# Patient Record
Sex: Male | Born: 1945
Health system: Southern US, Community
[De-identification: ages and names within clinical notes are randomized; demographics above are authoritative.]

## PROBLEM LIST (undated history)

## (undated) DIAGNOSIS — N400 Enlarged prostate without lower urinary tract symptoms: Secondary | ICD-10-CM

## (undated) DIAGNOSIS — R400 Somnolence: Secondary | ICD-10-CM

## (undated) DIAGNOSIS — J449 Chronic obstructive pulmonary disease, unspecified: Secondary | ICD-10-CM

## (undated) DIAGNOSIS — N529 Male erectile dysfunction, unspecified: Secondary | ICD-10-CM

## (undated) DIAGNOSIS — I10 Essential (primary) hypertension: Secondary | ICD-10-CM

## (undated) DIAGNOSIS — H8109 Meniere's disease, unspecified ear: Secondary | ICD-10-CM

## (undated) DIAGNOSIS — I712 Thoracic aortic aneurysm, without rupture, unspecified: Secondary | ICD-10-CM

## (undated) DIAGNOSIS — K219 Gastro-esophageal reflux disease without esophagitis: Secondary | ICD-10-CM

## (undated) DIAGNOSIS — E119 Type 2 diabetes mellitus without complications: Secondary | ICD-10-CM

## (undated) DIAGNOSIS — E041 Nontoxic single thyroid nodule: Secondary | ICD-10-CM

## (undated) DIAGNOSIS — E782 Mixed hyperlipidemia: Secondary | ICD-10-CM

## (undated) HISTORY — PX: CATARACT EXTRACTION: SUR2

## (undated) HISTORY — DX: Essential (primary) hypertension: I10

## (undated) HISTORY — DX: Type 2 diabetes mellitus without complications: E11.9

## (undated) HISTORY — DX: Thoracic aortic aneurysm, without rupture, unspecified: I71.20

## (undated) HISTORY — DX: Gastro-esophageal reflux disease without esophagitis: K21.9

## (undated) HISTORY — DX: Thoracic aortic aneurysm, without rupture: I71.2

## (undated) HISTORY — DX: Benign prostatic hyperplasia without lower urinary tract symptoms: N40.0

## (undated) HISTORY — DX: Somnolence: R40.0

## (undated) HISTORY — DX: Male erectile dysfunction, unspecified: N52.9

## (undated) HISTORY — PX: ELBOW SURGERY: SHX618

## (undated) HISTORY — DX: Chronic obstructive pulmonary disease, unspecified: J44.9

## (undated) HISTORY — DX: Nontoxic single thyroid nodule: E04.1

## (undated) HISTORY — DX: Meniere's disease, unspecified ear: H81.09

## (undated) HISTORY — DX: Mixed hyperlipidemia: E78.2

---

## 2006-07-02 ENCOUNTER — Ambulatory Visit: Payer: Self-pay | Admitting: Internal Medicine

## 2006-07-02 LAB — CONVERTED CEMR LAB
Albumin: 3.8 g/dL (ref 3.5–5.2)
Basophils Absolute: 0 10*3/uL (ref 0.0–0.1)
Bilirubin, Direct: 0.2 mg/dL (ref 0.0–0.3)
Cholesterol: 218 mg/dL (ref 0–200)
Direct LDL: 170.3 mg/dL
Eosinophils Absolute: 0.2 10*3/uL (ref 0.0–0.6)
Eosinophils Relative: 3.1 % (ref 0.0–5.0)
GFR calc non Af Amer: 91 mL/min
Glucose, Bld: 115 mg/dL — ABNORMAL HIGH (ref 70–99)
HCT: 46.6 % (ref 39.0–52.0)
Hemoglobin: 16.4 g/dL (ref 13.0–17.0)
Leukocytes, UA: NEGATIVE
Lymphocytes Relative: 24.3 % (ref 12.0–46.0)
MCHC: 35.2 g/dL (ref 30.0–36.0)
MCV: 92.3 fL (ref 78.0–100.0)
Monocytes Absolute: 0.4 10*3/uL (ref 0.2–0.7)
Neutrophils Relative %: 64.2 % (ref 43.0–77.0)
PSA: 5 ng/mL — ABNORMAL HIGH (ref 0.10–4.00)
Potassium: 4.2 meq/L (ref 3.5–5.1)
Sodium: 141 meq/L (ref 135–145)
Total Bilirubin: 0.8 mg/dL (ref 0.3–1.2)
Total CHOL/HDL Ratio: 7.4
Urobilinogen, UA: 0.2 (ref 0.0–1.0)

## 2006-07-07 ENCOUNTER — Ambulatory Visit: Payer: Self-pay | Admitting: Internal Medicine

## 2006-07-19 ENCOUNTER — Ambulatory Visit: Payer: Self-pay | Admitting: Internal Medicine

## 2006-07-26 ENCOUNTER — Ambulatory Visit: Payer: Self-pay | Admitting: Internal Medicine

## 2006-08-02 ENCOUNTER — Encounter (INDEPENDENT_AMBULATORY_CARE_PROVIDER_SITE_OTHER): Payer: Self-pay | Admitting: Specialist

## 2006-08-02 ENCOUNTER — Ambulatory Visit: Payer: Self-pay | Admitting: Internal Medicine

## 2007-05-30 ENCOUNTER — Encounter: Payer: Self-pay | Admitting: Internal Medicine

## 2007-05-30 DIAGNOSIS — I10 Essential (primary) hypertension: Secondary | ICD-10-CM | POA: Insufficient documentation

## 2010-10-03 NOTE — Assessment & Plan Note (Signed)
[facility]                           PRIMARY CARE OFFICE NOTE   STEELE, STRACENER                     MRN:          [id number]  DATE:07/07/2006                            DOB:          12-17-1945    Mr. Wayne [name] is a 65 year old Caucasian gentleman who presents to  establish for ongoing continuity of care. He reports he has not seen a  doctor or had a physical exam for at least 15 years. He considers  himself healthy, he feels good and has no active complaints.   PAST MEDICAL HISTORY:   SURGICAL:  Surgical repair of a traumatic fracture of his right elbow  and arm in 1999 requiring hip graft. Final result involved a significant  reduction in range of motion of his right arm with inability to flex  more than 80 degrees.   MEDICAL:  1. Usual childhood diseases.  2. History of headaches.  3. History of GERD.  4. Hypertension with high blood pressure readings at home over the      last several months.  5. DJD of the back.   CURRENT MEDICATIONS:  1. Two baby aspirins daily.  2. Prilosec 20 mg q. a.m.  3. Ibuprofen as needed.   FAMILY HISTORY:  Hypertension in parents and sister, heart disease in  parents, negative for colon cancer, or lung cancer, or prostate cancer,  negative for diabetes.   SOCIAL HISTORY:  Patient completed high school. He works as a Technical brewer person. He has been married 15 years, divorced, married 4  years, divorced, married 18 years to his present wife. He has 2  daughters, 1 son, 6 grandchildren. He reports his marriage is in good  health. Reports that his wife is in good health.   HABITS:  Tobacco one and one half packs per day with more than a 45 pack-  year smoking history, alcohol, averaging 3 ounces per day. Patient has  no known drug allergies.   REVIEW OF SYSTEMS:  Patient has early awakening, does have some mild  weight gain recently, he has not had an eye exam for more than 3 years,  no ENT,  cardiovascular, respiratory, GI, or GU complaints. He does have  some carpal spasm from time to time.   PHYSICAL EXAMINATION:  Temperature was 96.6, blood pressure 200/99,  pulse 77, weight 200.  GENERAL APPEARANCE: This is a heavyset Caucasian male, looks his stated  age in no acute distress.  HEENT EXAM: Normocephalic, atraumatic, EACs, and TM s were unremarkable,  oropharynx  revealed fair dentition, no buccal lesions were noted,  posterior pharynx was clear. Conjunctivae sclera were clear, pupils  equal, round, and reactive to light and accommodation, funduscopic exam  with hand held instrument revealed normal disc margins, question of  arterial narrowing, no exudates, hemorrhages, or other vascular  abnormalities.  NECK: Supple without thyromegaly.  NODES: No adenopathy was noted in the cervical supraclavicular,  axillary, inguinal regions.  CHEST: No CVA tenderness. Patient with increased AP diameter.  LUNGS: Clear with no rales, wheezes, or rhonchi.  CARDIOVASCULAR: 2+ radial pulse, no JVD, no carotid  bruits, he had a  quite precordium with a regular rate and rhythm without murmurs, rubs,  or gallops.  ABDOMEN: Protuberant, no organosplenomegaly was appreciated. He had no  guarding, or rebound, or tenderness.  GENITALIA: Normal male, bilaterally descended testicles without masses,  no evidence of inguinal hernia.  RECTAL EXAM: Normal sphincter tone was noted. Prostate was very  generous, firm but no nodules were appreciated, stool was heme positive.  EXTREMITIES: Without clubbing, cyanosis, edema, no deformities were  noted.   Laboratory, hemoglobin elevated at 16.4 grams consistent with smoking  history, white count was normal at 5,700 with a normal differential,  chemistries revealed electrolytes to be normal, glucose was 115, kidney  function normal with a creatinine of 0.9., liver functions were normal.  Cholesterol was elevated at 218, triglycerides 124, HDL was 29.6  and  low, LDL cholesterol was elevated at 170. 3, TSH was normal at 1.64, PSA  was elevated at 5.0.   A 12-lead electrocardiogram revealed increased QRS voltage, suggestive  of mild LVH. No other abnormalities are noted except for a bradycardic  rhythm.   PA and lateral chest x-ray revealed the patient to have left ventricular  hypertrophy, lung fields were otherwise clear.   ASSESSMENT/PLAN:  1. Hypertension, patient with significant hypertension. He has LVH at      this point. Renal function is normal. Plan: patient started on      Benicar 20 mg q. day, 3 weeks of samples were provided. He will      need to follow up to monitor blood pressure and adjust medications      as indicated.  2. Tobacco abuse, patient is adamantly encouraged to consider smoking      cessation. Chest x-ray revealed no parenchymal abnormalities. This      was explained to him as increased risk for coronary disease as well      as lung disease.  3. Lipids, patient with markedly elevated LDL cholesterol. Discussed      this with the patient in terms of cardiac risk. Plan: lifestyle      management with the patient to follow a low fat diet and to      increase his exercise quotient. Repeat laboratory in 3 months.  4. Gastrointestinal, patient will be due for routine colorectal cancer      screening. On exam however, he did have heme positive stool,      although a trace only. Plan: patient is referred to      gastrointestinal for a diagnostic colonoscopy.  5. Genitourinary: patient has an elevated PSA at 5.0. His prostate was      unremarkable with no specific nodules. Plan: repeat PSA in 3 months      at same time we repeat his lipid panel. If patient has any      accelerations we need to consider genitourinary referal.   SUMMARY:  This is a very pleasant gentleman, he really seems to be  stable, physical exam except for the problems outlined above. He will need to return to see me for blood pressure check  in 3 weeks or so. He  will need to have follow up laboratory in 3 months.     Rosalyn Gess Norins, MD  Electronically Signed    MEN/MedQ  DD: 07/08/2006  DT: 07/08/2006  Job #: [id number]   cc:   Dani Gobble

## 2011-07-14 ENCOUNTER — Encounter: Payer: Self-pay | Admitting: Internal Medicine

## 2012-03-11 ENCOUNTER — Encounter: Payer: Self-pay | Admitting: Internal Medicine

## 2015-05-22 DIAGNOSIS — M9901 Segmental and somatic dysfunction of cervical region: Secondary | ICD-10-CM | POA: Diagnosis not present

## 2015-05-22 DIAGNOSIS — M9902 Segmental and somatic dysfunction of thoracic region: Secondary | ICD-10-CM | POA: Diagnosis not present

## 2015-05-22 DIAGNOSIS — M5013 Cervical disc disorder with radiculopathy, cervicothoracic region: Secondary | ICD-10-CM | POA: Diagnosis not present

## 2015-05-22 DIAGNOSIS — M62830 Muscle spasm of back: Secondary | ICD-10-CM | POA: Diagnosis not present

## 2015-05-22 DIAGNOSIS — M50323 Other cervical disc degeneration at C6-C7 level: Secondary | ICD-10-CM | POA: Diagnosis not present

## 2015-06-14 DIAGNOSIS — I10 Essential (primary) hypertension: Secondary | ICD-10-CM | POA: Diagnosis not present

## 2015-06-14 DIAGNOSIS — J018 Other acute sinusitis: Secondary | ICD-10-CM | POA: Diagnosis not present

## 2015-06-14 DIAGNOSIS — E119 Type 2 diabetes mellitus without complications: Secondary | ICD-10-CM | POA: Diagnosis not present

## 2015-06-14 DIAGNOSIS — R42 Dizziness and giddiness: Secondary | ICD-10-CM | POA: Diagnosis not present

## 2015-07-15 DIAGNOSIS — R42 Dizziness and giddiness: Secondary | ICD-10-CM | POA: Diagnosis not present

## 2015-07-15 DIAGNOSIS — I10 Essential (primary) hypertension: Secondary | ICD-10-CM | POA: Diagnosis not present

## 2015-07-15 DIAGNOSIS — E1142 Type 2 diabetes mellitus with diabetic polyneuropathy: Secondary | ICD-10-CM | POA: Diagnosis not present

## 2015-07-15 DIAGNOSIS — R51 Headache: Secondary | ICD-10-CM | POA: Diagnosis not present

## 2015-08-13 DIAGNOSIS — R42 Dizziness and giddiness: Secondary | ICD-10-CM | POA: Diagnosis not present

## 2015-08-13 DIAGNOSIS — R202 Paresthesia of skin: Secondary | ICD-10-CM | POA: Diagnosis not present

## 2015-08-13 DIAGNOSIS — R51 Headache: Secondary | ICD-10-CM | POA: Diagnosis not present

## 2015-08-13 DIAGNOSIS — F5102 Adjustment insomnia: Secondary | ICD-10-CM | POA: Diagnosis not present

## 2015-08-20 DIAGNOSIS — D519 Vitamin B12 deficiency anemia, unspecified: Secondary | ICD-10-CM | POA: Diagnosis not present

## 2015-08-27 DIAGNOSIS — D519 Vitamin B12 deficiency anemia, unspecified: Secondary | ICD-10-CM | POA: Diagnosis not present

## 2015-09-04 DIAGNOSIS — D519 Vitamin B12 deficiency anemia, unspecified: Secondary | ICD-10-CM | POA: Diagnosis not present

## 2015-09-16 DIAGNOSIS — E1142 Type 2 diabetes mellitus with diabetic polyneuropathy: Secondary | ICD-10-CM | POA: Diagnosis not present

## 2015-09-16 DIAGNOSIS — F5102 Adjustment insomnia: Secondary | ICD-10-CM | POA: Diagnosis not present

## 2015-09-16 DIAGNOSIS — D519 Vitamin B12 deficiency anemia, unspecified: Secondary | ICD-10-CM | POA: Diagnosis not present

## 2015-09-16 DIAGNOSIS — N400 Enlarged prostate without lower urinary tract symptoms: Secondary | ICD-10-CM | POA: Diagnosis not present

## 2015-10-16 DIAGNOSIS — E1142 Type 2 diabetes mellitus with diabetic polyneuropathy: Secondary | ICD-10-CM | POA: Diagnosis not present

## 2015-10-16 DIAGNOSIS — I10 Essential (primary) hypertension: Secondary | ICD-10-CM | POA: Diagnosis not present

## 2015-10-16 DIAGNOSIS — D519 Vitamin B12 deficiency anemia, unspecified: Secondary | ICD-10-CM | POA: Diagnosis not present

## 2015-10-16 DIAGNOSIS — E1122 Type 2 diabetes mellitus with diabetic chronic kidney disease: Secondary | ICD-10-CM | POA: Diagnosis not present

## 2015-10-16 DIAGNOSIS — Z79899 Other long term (current) drug therapy: Secondary | ICD-10-CM | POA: Diagnosis not present

## 2015-10-16 DIAGNOSIS — E119 Type 2 diabetes mellitus without complications: Secondary | ICD-10-CM | POA: Diagnosis not present

## 2015-10-17 DIAGNOSIS — E1142 Type 2 diabetes mellitus with diabetic polyneuropathy: Secondary | ICD-10-CM | POA: Diagnosis not present

## 2015-10-17 DIAGNOSIS — R51 Headache: Secondary | ICD-10-CM | POA: Diagnosis not present

## 2015-10-17 DIAGNOSIS — E782 Mixed hyperlipidemia: Secondary | ICD-10-CM | POA: Diagnosis not present

## 2015-10-17 DIAGNOSIS — D519 Vitamin B12 deficiency anemia, unspecified: Secondary | ICD-10-CM | POA: Diagnosis not present

## 2015-10-17 DIAGNOSIS — I10 Essential (primary) hypertension: Secondary | ICD-10-CM | POA: Diagnosis not present

## 2015-10-28 DIAGNOSIS — I6782 Cerebral ischemia: Secondary | ICD-10-CM | POA: Diagnosis not present

## 2015-11-13 DIAGNOSIS — I10 Essential (primary) hypertension: Secondary | ICD-10-CM | POA: Diagnosis not present

## 2015-11-13 DIAGNOSIS — D518 Other vitamin B12 deficiency anemias: Secondary | ICD-10-CM | POA: Diagnosis not present

## 2015-11-13 DIAGNOSIS — R0602 Shortness of breath: Secondary | ICD-10-CM | POA: Diagnosis not present

## 2015-11-13 DIAGNOSIS — R05 Cough: Secondary | ICD-10-CM | POA: Diagnosis not present

## 2015-11-13 DIAGNOSIS — R51 Headache: Secondary | ICD-10-CM | POA: Diagnosis not present

## 2015-11-13 DIAGNOSIS — R42 Dizziness and giddiness: Secondary | ICD-10-CM | POA: Diagnosis not present

## 2015-11-25 DIAGNOSIS — N281 Cyst of kidney, acquired: Secondary | ICD-10-CM | POA: Diagnosis not present

## 2015-11-25 DIAGNOSIS — K5792 Diverticulitis of intestine, part unspecified, without perforation or abscess without bleeding: Secondary | ICD-10-CM | POA: Diagnosis not present

## 2015-12-05 DIAGNOSIS — R42 Dizziness and giddiness: Secondary | ICD-10-CM | POA: Diagnosis not present

## 2015-12-05 DIAGNOSIS — K5732 Diverticulitis of large intestine without perforation or abscess without bleeding: Secondary | ICD-10-CM | POA: Diagnosis not present

## 2015-12-05 DIAGNOSIS — J410 Simple chronic bronchitis: Secondary | ICD-10-CM | POA: Diagnosis not present

## 2016-01-23 DIAGNOSIS — I10 Essential (primary) hypertension: Secondary | ICD-10-CM | POA: Diagnosis not present

## 2016-01-23 DIAGNOSIS — Z23 Encounter for immunization: Secondary | ICD-10-CM | POA: Diagnosis not present

## 2016-01-23 DIAGNOSIS — E1142 Type 2 diabetes mellitus with diabetic polyneuropathy: Secondary | ICD-10-CM | POA: Diagnosis not present

## 2016-01-23 DIAGNOSIS — E782 Mixed hyperlipidemia: Secondary | ICD-10-CM | POA: Diagnosis not present

## 2016-01-23 DIAGNOSIS — D518 Other vitamin B12 deficiency anemias: Secondary | ICD-10-CM | POA: Diagnosis not present

## 2016-04-24 DIAGNOSIS — E1142 Type 2 diabetes mellitus with diabetic polyneuropathy: Secondary | ICD-10-CM | POA: Diagnosis not present

## 2016-04-24 DIAGNOSIS — E782 Mixed hyperlipidemia: Secondary | ICD-10-CM | POA: Diagnosis not present

## 2016-04-24 DIAGNOSIS — H6502 Acute serous otitis media, left ear: Secondary | ICD-10-CM | POA: Diagnosis not present

## 2016-04-24 DIAGNOSIS — I10 Essential (primary) hypertension: Secondary | ICD-10-CM | POA: Diagnosis not present

## 2016-04-24 DIAGNOSIS — D518 Other vitamin B12 deficiency anemias: Secondary | ICD-10-CM | POA: Diagnosis not present

## 2016-10-20 ENCOUNTER — Encounter (INDEPENDENT_AMBULATORY_CARE_PROVIDER_SITE_OTHER): Payer: Self-pay

## 2016-10-20 ENCOUNTER — Encounter: Payer: Self-pay | Admitting: Neurology

## 2016-10-20 ENCOUNTER — Ambulatory Visit (INDEPENDENT_AMBULATORY_CARE_PROVIDER_SITE_OTHER): Payer: Medicare Other | Admitting: Neurology

## 2016-10-20 DIAGNOSIS — R413 Other amnesia: Secondary | ICD-10-CM

## 2016-10-20 DIAGNOSIS — G3184 Mild cognitive impairment, so stated: Secondary | ICD-10-CM | POA: Insufficient documentation

## 2016-10-20 DIAGNOSIS — R42 Dizziness and giddiness: Secondary | ICD-10-CM | POA: Insufficient documentation

## 2016-10-20 DIAGNOSIS — G4489 Other headache syndrome: Secondary | ICD-10-CM | POA: Insufficient documentation

## 2016-10-20 DIAGNOSIS — R9089 Other abnormal findings on diagnostic imaging of central nervous system: Secondary | ICD-10-CM | POA: Diagnosis not present

## 2016-10-20 MED ORDER — NORTRIPTYLINE HCL 10 MG PO CAPS: ORAL_CAPSULE | ORAL | 3 refills | Status: DC

## 2016-10-20 NOTE — Progress Notes (Signed)
[facility]  PATIENT: Wayne [name] DOB: [birth year]  REFERRING DOCTOR OR PCP:  Rochel Brome SOURCE: patient, notes from Dr. Tobie Poet, MRI reports, MRI images on CD/PACS  _________________________________   HISTORICAL  CHIEF COMPLAINT:  Chief Complaint  Patient presents with  . Headache    Wayne [name] is here with his wife Caren Griffins for eval of intermittent h/a's, vertigo over the last 5 yrs.  MRI brain done at [facility] and pt. dx. with "early dementia."  Pt. sts. he has never c/o memory loss, would like a 2nd opinion regarding dx/fim  . Dizziness    HISTORY OF PRESENT ILLNESS:  I had the pleasure seeing you patient, Wayne [name], at [facility] for neurologic consultation regarding his headaches and abnormal MRI.  He is a 71 yo man who has had headaches for the past 3 years.   He has had pain that radiates form his upper neck/occiput to the right side of his head.   Pain is usually mild but is present daily with fluctuations.   He denies nausea, photophpobia or phonophobia.    Pain does not worsen or improve or worsen with movements.    BC powders help for a couple hours.   He saw a chiropractor and felt a little better for a while but had no sustained benefit.      He also has had vertigo and feels off balanced for the past 3-4 year.    Thi is worse if he looks up or when he bends over.   He describes a translational more than rotary dizziness.    Vertigo is not present when he is stiill but will occur as he stands up.   He has reduced left sided hearing x 3-4 years as well.     He denies any problems with his memory or with other cognitive skills.    However, his wife feels that he has had mild forgetfulness and he had one episode of getting lost while driving. She does not think that there has been rapid change.  He smokes 1 1/2 ppd tobacco.    He also has HTN and NIDDM.  He is on bASA  I personally reviewed the MRI of the brain performed a  [facility] on the 1.2 Tesla magnet 10/28/2015. It shows severe atrophy that is generalized but a little worse in the mesial temporal lobes and the parietal lobes. There is moderate chronic buttock vessel ischemic change. There were no acute findings. Additionally there was mild ethmoid sinusitis.  REVIEW OF SYSTEMS: Constitutional: No fevers, chills, sweats, or change in appetite Eyes: No visual changes, double vision, eye pain Ear, nose and throat: He notes vertical and also has hearing is reduced on the left side. Cardiovascular: No chest pain, palpitations Respiratory: No shortness of breath at rest or with exertion.   He wheezes and was dx with COPD GastrointestinaI: No nausea, vomiting, diarrhea, abdominal pain, fecal incontinence Genitourinary: No dysuria, urinary retention or frequency.  No nocturia. Musculoskeletal: No neck pain, back pain Integumentary: No rash, pruritus, skin lesions Neurological: as above Psychiatric: No depression at this time.  No anxiety Endocrine: No palpitations, diaphoresis, change in appetite, change in weigh or increased thirst Hematologic/Lymphatic: No anemia, purpura, petechiae. Allergic/Immunologic: No itchy/runny eyes, nasal congestion, recent allergic reactions, rashes  ALLERGIES: No Known Allergies  HOME MEDICATIONS:  Current Outpatient Prescriptions:  .  amLODipine (NORVASC) 5 MG tablet, , Disp: , Rfl:  .  aspirin 81 MG chewable tablet, Chew 81  mg by mouth daily., Disp: , Rfl:  .  Aspirin-Salicylamide-Caffeine (BC HEADACHE POWDER PO), Take by mouth., Disp: , Rfl:  .  finasteride (PROSCAR) 5 MG tablet, Take 5 mg by mouth daily., Disp: , Rfl:  .  LORazepam (ATIVAN) 1 MG tablet, , Disp: , Rfl:  .  losartan (COZAAR) 100 MG tablet, , Disp: , Rfl:  .  metFORMIN (GLUCOPHAGE) 1000 MG tablet, , Disp: , Rfl:  .  Naproxen Sod-Diphenhydramine (ALEVE PM PO), Take by mouth., Disp: , Rfl:  .  omeprazole (PRILOSEC) 40 MG capsule, , Disp: , Rfl:  .   nortriptyline (PAMELOR) 10 MG capsule, One or two at bedtime, Disp: 60 capsule, Rfl: 3  PAST MEDICAL HISTORY: Past Medical History:  Diagnosis Date  . COPD (chronic obstructive pulmonary disease) (HCC)   . Diabetes mellitus without complication (HCC)   . Hypertension     PAST SURGICAL HISTORY: Past Surgical History:  Procedure Laterality Date  . CATARACT EXTRACTION Bilateral   . ELBOW SURGERY Right     FAMILY HISTORY: Family History  Problem Relation Age of Onset  . Heart attack Mother   . Diabetes Father   . COPD Father     SOCIAL HISTORY:  Social History   Social History  . Marital status: Married    Spouse name: N/A  . Number of children: N/A  . Years of education: N/A   Occupational History  . Not on file.   Social History Main Topics  . Smoking status: Current Every Day Smoker  . Smokeless tobacco: Never Used  . Alcohol use Yes     Comment: occasional  . Drug use: No  . Sexual activity: Not on file   Other Topics Concern  . Not on file   Social History Narrative  . No narrative on file     PHYSICAL EXAM  Vitals:   10/20/16 1425  BP: (!) 175/92  Pulse: (!) 58  Resp: (!) 24  Weight: 195 lb 8 oz (88.7 kg)  Height: 5\' 7"  (1.702 m)    Body mass index is 30.62 kg/m.   General: The patient is well-developed and well-nourished and in no acute distress  HEENT:  [place]/AT; Tympanic membranes intact.  Funduscopic exam shows normal optic discs and retinal vessels.  Pharyn is non-erythematous.     Neck: The neck is supple, no carotid bruits are noted.  The neck is nontender.  Cardiovascular: The heart has a regular rate and rhythm with a normal S1 and S2. There were no murmurs, gallops or rubs. Lungs show mild wheezing (has COPD)  Skin: Extremities are without significant edema.  Musculoskeletal:  Back is nontender  Neurologic Exam  Mental status: The patient is alert and oriented x 3 at the time of the examination. The patient has apparent  normal recent  (3/3) and remote memory, with an apparently reduced attention span and concentration ability (100-94; WORLD-DLORW; 20-17-14-11-8).  He numbers a clock face correctly but had difficulty putting the hands in the right place. He made one mistake continuing a geometric pattern. He drew a box well.    Speech is normal.  Cranial nerves: Extraocular movements are full. Pupils are equal, round, and reactive to light and accomodation.  Visual fields are full.  Facial symmetry is present. There is good facial sensation to soft touch bilaterally.Facial strength is normal.  Trapezius and sternocleidomastoid strength is normal. No dysarthria is noted.  The tongue is midline, and the patient has symmetric elevation of the soft palate.  No obvious hearing deficits are noted.   Weber was midline  Motor:  Muscle bulk is normal.   Tone is normal. Strength is  5 / 5 in all 4 extremities.   Sensory: Sensory testing is intact to pinprick, soft touch and vibration sensation in all 4 extremities.  Coordination: Cerebellar testing reveals good finger-nose-finger and heel-to-shin bilaterally.  Gait and station: Station is normal.   Gait is normal. Tandem gait is wide Romberg is negative.   Reflexes: Deep tendon reflexes are symmetric and 1+  Bilateral in arms knees and absnet in ankles.   Plantar responses are flexor.  Dix-Hallpike maneuvers did not cause any vertigo or nystagmus.    DIAGNOSTIC DATA (LABS, IMAGING, TESTING) - I reviewed patient records, labs, notes, testing and imaging myself where available.  Lab Results  Component Value Date   WBC 5.7 07/02/2006   HGB 16.4 07/02/2006   HCT 46.6 07/02/2006   MCV 92.3 07/02/2006   PLT 268 07/02/2006      Component Value Date/Time   NA 141 07/02/2006 0807   K 4.2 07/02/2006 0807   CL 104 07/02/2006 0807   CO2 30 07/02/2006 0807   GLUCOSE 115 (H) 07/02/2006 0807   BUN 7 07/02/2006 0807   CREATININE 0.9 07/02/2006 0807   CALCIUM 8.9  07/02/2006 0807   PROT 6.7 07/02/2006 0807   ALBUMIN 3.8 07/02/2006 0807   AST 26 07/02/2006 0807   ALT 36 07/02/2006 0807   ALKPHOS 70 07/02/2006 0807   BILITOT 0.8 07/02/2006 0807   GFRNONAA 91 07/02/2006 0807   GFRAA 110 07/02/2006 0807   Lab Results  Component Value Date   CHOL 218 (HH) 07/02/2006   HDL 29.6 (L) 07/02/2006   LDLDIRECT 170.3 07/02/2006   TRIG 124 07/02/2006   CHOLHDL 7.4 CALC 07/02/2006   No results found for: HGBA1C No results found for: VITAMINB12 Lab Results  Component Value Date   TSH 1.64 07/02/2006       ASSESSMENT AND PLAN  Memory loss - Plan: Sedimentation rate, Vitamin B12, TSH  Other headache syndrome - Plan: Sedimentation rate  Vertigo  Abnormal brain MRI  Mild cognitive impairment   In summary, Wayne [name] is a 71 year old man who has had difficulty with headache and vertigo and was found on MRI to have atrophy and chronic microvascular ischemic change. The atrophy is more than expected for age.  We discussed that the combination of that and his performance on cognitive testing would be consistent with mild cognitive impairment though he did not perform poorly enough to be considered dementia at this time. We discussed that many people can remain in mild cognitive impairment without significant progression but about 20% per year we'll progress to Alzheimer's the fact that he did better with memory than with some other cognitive tests actually indicates a mildly reduced likelihood of progression to Alzheimer's disease.   I also discussed that the chronic microvascular ischemic changes are worse than expected for age. The combination of smoking, hypertension and diabetes could be contributing.   We will check ESR, TSH and B12 to make sure that easily treatable causes of memory disturbance are evaluated.    I will add low-dose nortriptyline to try to help his headaches. The etiology of his dizzines is unclear and his examination did not show an  easily provoked vertigo.  I will see him back in 6 months and we will reevaluate his cognition at that time however, he is advised to call us back sooner if  he has new or worsening neurologic symptoms.  Thank you for asking me to see Wayne [name]. Please let me know if I can be of further assistance with MRI of the patient's the future.   Palestine Mosco A. Felecia Shelling, MD, PhD 10/16/5181, 4:37 PM Certified in Neurology, Clinical Neurophysiology, Sleep Medicine, Pain Medicine and Neuroimaging  [facility] [address] [place], [place] [address] [phone]

## 2016-10-21 LAB — VITAMIN B12: Vitamin B-12: 297 pg/mL (ref 232–1245)

## 2016-10-21 LAB — SEDIMENTATION RATE: SED RATE: 5 mm/h (ref 0–30)

## 2016-10-21 LAB — TSH: TSH: 2.54 u[IU]/mL (ref 0.450–4.500)

## 2016-10-22 ENCOUNTER — Telehealth: Payer: Self-pay | Admitting: *Deleted

## 2016-10-22 NOTE — Telephone Encounter (Signed)
I have spoken with Mr. Wayne [name] this afternoon and per RAS, explained that lab work done in our office is fine.  He verbalized understanding of same/fim

## 2016-10-22 NOTE — Telephone Encounter (Signed)
-----   Message from Britt Bottom, MD sent at 10/21/2016  3:52 PM EDT ----- Please let the patient know that the lab work is fine.

## 2016-11-03 DIAGNOSIS — R079 Chest pain, unspecified: Secondary | ICD-10-CM | POA: Diagnosis not present

## 2016-11-03 DIAGNOSIS — R42 Dizziness and giddiness: Secondary | ICD-10-CM | POA: Diagnosis not present

## 2016-11-03 DIAGNOSIS — E1142 Type 2 diabetes mellitus with diabetic polyneuropathy: Secondary | ICD-10-CM | POA: Diagnosis not present

## 2016-11-03 DIAGNOSIS — F5102 Adjustment insomnia: Secondary | ICD-10-CM | POA: Diagnosis not present

## 2016-11-03 DIAGNOSIS — R911 Solitary pulmonary nodule: Secondary | ICD-10-CM | POA: Diagnosis not present

## 2016-11-03 DIAGNOSIS — I10 Essential (primary) hypertension: Secondary | ICD-10-CM | POA: Diagnosis not present

## 2016-11-03 DIAGNOSIS — E782 Mixed hyperlipidemia: Secondary | ICD-10-CM | POA: Diagnosis not present

## 2016-11-03 DIAGNOSIS — N400 Enlarged prostate without lower urinary tract symptoms: Secondary | ICD-10-CM | POA: Diagnosis not present

## 2017-02-05 DIAGNOSIS — E1142 Type 2 diabetes mellitus with diabetic polyneuropathy: Secondary | ICD-10-CM | POA: Diagnosis not present

## 2017-02-05 DIAGNOSIS — E782 Mixed hyperlipidemia: Secondary | ICD-10-CM | POA: Diagnosis not present

## 2017-02-05 DIAGNOSIS — Z23 Encounter for immunization: Secondary | ICD-10-CM | POA: Diagnosis not present

## 2017-02-05 DIAGNOSIS — R42 Dizziness and giddiness: Secondary | ICD-10-CM | POA: Diagnosis not present

## 2017-02-05 DIAGNOSIS — N400 Enlarged prostate without lower urinary tract symptoms: Secondary | ICD-10-CM | POA: Diagnosis not present

## 2017-02-05 DIAGNOSIS — F5102 Adjustment insomnia: Secondary | ICD-10-CM | POA: Diagnosis not present

## 2017-02-05 DIAGNOSIS — I10 Essential (primary) hypertension: Secondary | ICD-10-CM | POA: Diagnosis not present

## 2017-04-21 ENCOUNTER — Ambulatory Visit: Payer: Medicare Other | Admitting: Neurology

## 2017-05-13 ENCOUNTER — Encounter (HOSPITAL_COMMUNITY): Payer: Self-pay

## 2017-05-13 ENCOUNTER — Emergency Department (HOSPITAL_COMMUNITY): Payer: Medicare Other

## 2017-05-13 ENCOUNTER — Emergency Department (HOSPITAL_COMMUNITY)
Admission: EM | Admit: 2017-05-13 | Discharge: 2017-05-13 | Disposition: A | Discharge status: Home / Self Care | Payer: Medicare Other | Attending: Emergency Medicine | Admitting: Emergency Medicine

## 2017-05-13 DIAGNOSIS — Z79899 Other long term (current) drug therapy: Secondary | ICD-10-CM | POA: Insufficient documentation

## 2017-05-13 DIAGNOSIS — R42 Dizziness and giddiness: Secondary | ICD-10-CM | POA: Diagnosis not present

## 2017-05-13 DIAGNOSIS — E119 Type 2 diabetes mellitus without complications: Secondary | ICD-10-CM | POA: Insufficient documentation

## 2017-05-13 DIAGNOSIS — Z7982 Long term (current) use of aspirin: Secondary | ICD-10-CM | POA: Insufficient documentation

## 2017-05-13 DIAGNOSIS — J449 Chronic obstructive pulmonary disease, unspecified: Secondary | ICD-10-CM | POA: Insufficient documentation

## 2017-05-13 DIAGNOSIS — Z7984 Long term (current) use of oral hypoglycemic drugs: Secondary | ICD-10-CM | POA: Diagnosis not present

## 2017-05-13 DIAGNOSIS — I10 Essential (primary) hypertension: Secondary | ICD-10-CM | POA: Diagnosis not present

## 2017-05-13 DIAGNOSIS — M549 Dorsalgia, unspecified: Secondary | ICD-10-CM | POA: Diagnosis not present

## 2017-05-13 DIAGNOSIS — M545 Low back pain: Secondary | ICD-10-CM | POA: Insufficient documentation

## 2017-05-13 DIAGNOSIS — R200 Anesthesia of skin: Secondary | ICD-10-CM | POA: Diagnosis present

## 2017-05-13 DIAGNOSIS — M5126 Other intervertebral disc displacement, lumbar region: Secondary | ICD-10-CM | POA: Diagnosis not present

## 2017-05-13 DIAGNOSIS — F17210 Nicotine dependence, cigarettes, uncomplicated: Secondary | ICD-10-CM | POA: Insufficient documentation

## 2017-05-13 DIAGNOSIS — G8929 Other chronic pain: Secondary | ICD-10-CM | POA: Diagnosis not present

## 2017-05-13 DIAGNOSIS — K458 Other specified abdominal hernia without obstruction or gangrene: Secondary | ICD-10-CM | POA: Diagnosis not present

## 2017-05-13 LAB — BASIC METABOLIC PANEL
ANION GAP: 11 (ref 5–15)
BUN: 12 mg/dL (ref 6–20)
CALCIUM: 9.3 mg/dL (ref 8.9–10.3)
CHLORIDE: 98 mmol/L — AB (ref 101–111)
CO2: 28 mmol/L (ref 22–32)
CREATININE: 0.8 mg/dL (ref 0.61–1.24)
GFR calc Af Amer: >60 mL/min (ref >60)
GFR calc non Af Amer: >60 mL/min (ref >60)
Glucose, Bld: 145 mg/dL — ABNORMAL HIGH (ref 65–99)
Potassium: 3.5 mmol/L (ref 3.5–5.1)
Sodium: 137 mmol/L (ref 135–145)

## 2017-05-13 LAB — CBC WITH DIFFERENTIAL/PLATELET
Basophils Absolute: 0 10*3/uL (ref 0.0–0.1)
Basophils Relative: 0 %
Eosinophils Absolute: 0.1 10*3/uL (ref 0.0–0.7)
Eosinophils Relative: 2 %
HEMATOCRIT: 48.6 % (ref 39.0–52.0)
Hemoglobin: 17.1 g/dL — ABNORMAL HIGH (ref 13.0–17.0)
LYMPHS PCT: 26 %
Lymphs Abs: 1.9 10*3/uL (ref 0.7–4.0)
MCH: 32.6 pg (ref 26.0–34.0)
MCHC: 35.2 g/dL (ref 30.0–36.0)
MCV: 92.6 fL (ref 78.0–100.0)
MONO ABS: 0.5 10*3/uL (ref 0.1–1.0)
MONOS PCT: 7 %
NEUTROS ABS: 4.8 10*3/uL (ref 1.7–7.7)
Neutrophils Relative %: 65 %
Platelets: 219 10*3/uL (ref 150–400)
RBC: 5.25 MIL/uL (ref 4.22–5.81)
RDW: 13.2 % (ref 11.5–15.5)
WBC: 7.3 10*3/uL (ref 4.0–10.5)

## 2017-05-13 MED ORDER — LORAZEPAM 2 MG/ML IJ SOLN
1.0000 mg | Freq: Once | INTRAMUSCULAR | Status: AC
  Administered 2017-05-13: 1 mg via INTRAVENOUS
  Filled 2017-05-13: qty 1

## 2017-05-13 MED ORDER — ACETAMINOPHEN 500 MG PO TABS: 500.0000 mg | ORAL_TABLET | Freq: Four times a day (QID) | ORAL | 0 refills | Status: DC | PRN

## 2017-05-13 NOTE — ED Notes (Signed)
Pt ambulated to room from waiting room, tolerated well. 

## 2017-05-13 NOTE — ED Provider Notes (Signed)
[facility] EMERGENCY DEPARTMENT Provider Note   CSN: [id number] Arrival date & time: 05/13/17  1423     History   Chief Complaint No chief complaint on file.   HPI Wayne [name] is a 71 y.o. male.  Wayne [name] is a 71 y.o. Male who presents to the emergency department complaining of right leg numbness and foot drop ongoing for the past 4 days.  Patient reports his symptoms are worse about 4 days ago when he felt like he was dragging his toes across the floor on his right side.  He reports this is somewhat improved today.  He still reports feeling numbness and tingling across the dorsum of his right foot.  He also reports numbness and tingling in his right buttocks and into his right low back.  He denies any injury or trauma to his back.  He reports he also has some chronic feelings of lightheadedness/dizziness ongoing for over a year.  He had a recent MRI of his brain that he reports showed only early Alzheimer's disease.  He reports he has trouble sensing where his foot is other weakness, numbness or tingling.  He denies any right arm weakness or tingling.  No facial droop.  He denies fevers, falls, injury to his back, loss of bladder control, loss of bowel control, headache, syncope, room spinning dizziness, chest pain, coughing, shortness of breath, abdominal pain, nausea, vomiting or diarrhea.   The history is provided by the patient and medical records. No language interpreter was used.    Past Medical History:  Diagnosis Date  . COPD (chronic obstructive pulmonary disease) ([place])   . Diabetes mellitus without complication ([place])   . Hypertension     Patient Active Problem List   Diagnosis Date Noted  . Memory loss 10/20/2016  . Other headache syndrome 10/20/2016  . Vertigo 10/20/2016  . Abnormal brain MRI 10/20/2016  . Mild cognitive impairment 10/20/2016  . HYPERTENSION 05/30/2007    Past Surgical History:  Procedure Laterality Date  .  CATARACT EXTRACTION Bilateral   . ELBOW SURGERY Right        Home Medications    Prior to Admission medications   Medication Sig Start Date End Date Taking? Authorizing Provider  amLODipine (NORVASC) 5 MG tablet Take 5 mg by mouth daily.  07/30/16  Yes [provider]  aspirin 81 MG chewable tablet Chew 81 mg by mouth daily.   Yes [provider]  Aspirin-Salicylamide-Caffeine (BC HEADACHE POWDER PO) Take 1 packet by mouth every 8 (eight) hours as needed (for headaches).    Yes [provider]  finasteride (PROSCAR) 5 MG tablet Take 5 mg by mouth at bedtime.    Yes [provider]  fluticasone (FLONASE) 50 MCG/ACT nasal spray Place 2 sprays into both nostrils daily as needed for allergies or rhinitis.   Yes [provider]  LORazepam (ATIVAN) 1 MG tablet Take 1 mg by mouth daily as needed for anxiety or sleep.  10/14/16  Yes [provider]  losartan (COZAAR) 100 MG tablet Take 100 mg by mouth daily.  07/30/16  Yes [provider]  metFORMIN (GLUCOPHAGE) 1000 MG tablet Take 1,000 mg by mouth at bedtime.  07/30/16  Yes [provider]  Naproxen Sod-Diphenhydramine (ALEVE PM PO) Take 1 tablet by mouth at bedtime as needed (for pain and/or sleep).    Yes [provider]  omeprazole (PRILOSEC) 40 MG capsule Take 40 mg by mouth daily.  07/30/16  Yes [provider]  tamsulosin (FLOMAX) 0.4 MG CAPS capsule Take 0.4 mg by mouth at bedtime.   Yes [provider]  vitamin B-12 (CYANOCOBALAMIN) 1000 MCG tablet Take 1,000 mcg by mouth daily.   Yes [provider]  acetaminophen (TYLENOL) 500 MG tablet Take 1 tablet (500 mg total) by mouth every 6 (six) hours as needed. 05/13/17   Waynetta Pean, PA-C  nortriptyline (PAMELOR) 10 MG capsule One or two at bedtime Patient not taking: Reported on 05/13/2017 10/20/16   Britt Bottom, MD    Family History Family History  Problem Relation Age of Onset    . Heart attack Mother   . Diabetes Father   . COPD Father     Social History Social History   Tobacco Use  . Smoking status: Current Every Day Smoker  . Smokeless tobacco: Never Used  Substance Use Topics  . Alcohol use: Yes    Comment: occasional  . Drug use: No     Allergies   Pineapple   Review of Systems Review of Systems  Constitutional: Negative for chills and fever.  HENT: Negative for congestion and sore throat.   Eyes: Negative for visual disturbance.  Respiratory: Negative for cough and shortness of breath.   Cardiovascular: Negative for chest pain.  Gastrointestinal: Negative for abdominal pain, diarrhea, nausea and vomiting.  Genitourinary: Negative for difficulty urinating and dysuria.  Musculoskeletal: Positive for back pain and gait problem. Negative for neck pain.  Skin: Negative for rash.  Neurological: Positive for weakness, light-headedness and numbness. Negative for dizziness, syncope and headaches.     Physical Exam Updated Vital Signs BP (!) 161/96   Pulse 79   Temp 98 F (36.7 C) (Oral)   Resp 16   Ht 5\' 7"  (1.702 m)   Wt 87.5 kg (193 lb)   SpO2 96%   BMI 30.23 kg/m   Physical Exam  Constitutional: He is oriented to person, place, and time. He appears well-developed and well-nourished. No distress.  Nontoxic-appearing.  HENT:  Head: Normocephalic and atraumatic.  Mouth/Throat: Oropharynx is clear and moist.  Eyes: Conjunctivae are normal. Pupils are equal, round, and reactive to light. Right eye exhibits no discharge. Left eye exhibits no discharge.  Neck: Neck supple.  Cardiovascular: Normal rate, regular rhythm, normal heart sounds and intact distal pulses. Exam reveals no gallop and no friction rub.  No murmur heard. Bilateral radial, posterior tibialis and dorsalis pedis pulses are intact.    Pulmonary/Chest: Effort normal and breath sounds normal. No respiratory distress. He has no wheezes. He has no rales.  Abdominal: Soft.  There is no tenderness.  Musculoskeletal: He exhibits no edema or tenderness.  No lower extremity edema or tenderness.  Good strength to his bilateral lower extremities.  Good strength with plantar and dorsiflexion bilaterally.  No foot drop.  No midline neck or back tenderness.  No overlying skin changes to his back.  No calf edema or tenderness.  Lymphadenopathy:    He has no cervical adenopathy.  Neurological: He is alert and oriented to person, place, and time. A sensory deficit is present. No cranial nerve deficit. He exhibits normal muscle tone. Coordination normal.  Patient reports decreased sensation to the dorsum of his right foot.  He has normal gait.  No foot drop.  Good strength to his bilateral upper and lower extremities.  Good grip strength bilaterally.  No pronator drift.  Finger to nose intact.  Cranial nerves are intact.  Speech is clear and  coherent.  Unable to obtain patellar DTRs bilaterally.  Skin: Skin is warm and dry. Capillary refill takes less than 2 seconds. No rash noted. He is not diaphoretic. No erythema. No pallor.  Psychiatric: He has a normal mood and affect. His behavior is normal.  Nursing note and vitals reviewed.    ED Treatments / Results  Labs (all labs ordered are listed, but only abnormal results are displayed) Labs Reviewed  CBC WITH DIFFERENTIAL/PLATELET - Abnormal; Notable for the following components:      Result Value   Hemoglobin 17.1 (*)    All other components within normal limits  BASIC METABOLIC PANEL - Abnormal; Notable for the following components:   Chloride 98 (*)    Glucose, Bld 145 (*)    All other components within normal limits    EKG  EKG Interpretation None       Radiology Mr Thoracic Spine Wo Contrast  Result Date: 05/13/2017 CLINICAL DATA:  Back pain. Rapidly progressive new neurological deficit. EXAM: MRI THORACIC AND LUMBAR SPINE WITHOUT CONTRAST TECHNIQUE: Multiplanar and multiecho pulse sequences of the thoracic  and lumbar spine were obtained without intravenous contrast. COMPARISON:  None. FINDINGS: MRI THORACIC SPINE FINDINGS Alignment:  Physiologic. Vertebrae: No fracture, evidence of discitis, or bone lesion. Cord:  Normal signal and morphology. Paraspinal and other soft tissues: Dilatation of the descending thoracic aorta to diameter 3.8 cm. Otherwise negative. Disc levels: T1-2 through T12-L1: No disc bulging or protrusion. No spinal or foraminal stenosis. No significant facet joint disease. MRI LUMBAR SPINE FINDINGS Segmentation:  Standard. Alignment: 3 mm spondylolisthesis at L4-5. 4 mm retrolisthesis at L5-S1. Vertebrae:  No fracture, evidence of discitis, or bone lesion. Conus medullaris and cauda equina: Conus extends to the L1-2 level. Conus and cauda equina appear normal. Paraspinal and other soft tissues: 6.9 cm simple appearing cyst on the upper pole the left kidney. Multiple small cysts in the remainder of the left kidney. Multiple small cysts in the right kidney. Maximum transverse diameter of the abdominal aorta is 2.7 cm. Disc levels: L1-2:  Slight disc desiccation.  Otherwise normal. L2-3:  Normal. L3-4: Normal disc. Moderate bilateral facet arthritis. No neural impingement. L4-5: Moderately severe bilateral facet arthritis with ligamentum flavum hypertrophy. Grade 1 spondylolisthesis. Focal small soft disc protrusion in and lateral to the right neural foramen which could affect the right L4 nerve. Small bulge into the left neural foramen and across the midline. The combination of the disc bulging, spondylolisthesis, and hypertrophied facet joints and ligamentum flavum combine to create moderately severe spinal stenosis. This is best seen on image 24 of series 13. L5-S1: Marked disc space narrowing. Slight retrolisthesis of L5 on S1. No bulging or protrusion of the uncovered disc. No neural impingement. No foraminal stenosis. Mild bilateral facet arthritis. IMPRESSION: MR THORACIC SPINE IMPRESSION No  significant abnormality of the thoracic spine. MR LUMBAR SPINE IMPRESSION 1. Focal soft disc protrusion into the right neural foramen at L4-5 extending lateral to the neural foramen touching the right L4 nerve. 2. Moderately severe spinal stenosis at L4-5. Electronically Signed   By: Lorriane Shire M.D.   On: 05/13/2017 19:02   Mr Lumbar Spine Wo Contrast  Result Date: 05/13/2017 CLINICAL DATA:  Back pain. Rapidly progressive new neurological deficit. EXAM: MRI THORACIC AND LUMBAR SPINE WITHOUT CONTRAST TECHNIQUE: Multiplanar and multiecho pulse sequences of the thoracic and lumbar spine were obtained without intravenous contrast. COMPARISON:  None. FINDINGS: MRI THORACIC SPINE FINDINGS Alignment:  Physiologic. Vertebrae: No fracture, evidence of  discitis, or bone lesion. Cord:  Normal signal and morphology. Paraspinal and other soft tissues: Dilatation of the descending thoracic aorta to diameter 3.8 cm. Otherwise negative. Disc levels: T1-2 through T12-L1: No disc bulging or protrusion. No spinal or foraminal stenosis. No significant facet joint disease. MRI LUMBAR SPINE FINDINGS Segmentation:  Standard. Alignment: 3 mm spondylolisthesis at L4-5. 4 mm retrolisthesis at L5-S1. Vertebrae:  No fracture, evidence of discitis, or bone lesion. Conus medullaris and cauda equina: Conus extends to the L1-2 level. Conus and cauda equina appear normal. Paraspinal and other soft tissues: 6.9 cm simple appearing cyst on the upper pole the left kidney. Multiple small cysts in the remainder of the left kidney. Multiple small cysts in the right kidney. Maximum transverse diameter of the abdominal aorta is 2.7 cm. Disc levels: L1-2:  Slight disc desiccation.  Otherwise normal. L2-3:  Normal. L3-4: Normal disc. Moderate bilateral facet arthritis. No neural impingement. L4-5: Moderately severe bilateral facet arthritis with ligamentum flavum hypertrophy. Grade 1 spondylolisthesis. Focal small soft disc protrusion in and lateral  to the right neural foramen which could affect the right L4 nerve. Small bulge into the left neural foramen and across the midline. The combination of the disc bulging, spondylolisthesis, and hypertrophied facet joints and ligamentum flavum combine to create moderately severe spinal stenosis. This is best seen on image 24 of series 13. L5-S1: Marked disc space narrowing. Slight retrolisthesis of L5 on S1. No bulging or protrusion of the uncovered disc. No neural impingement. No foraminal stenosis. Mild bilateral facet arthritis. IMPRESSION: MR THORACIC SPINE IMPRESSION No significant abnormality of the thoracic spine. MR LUMBAR SPINE IMPRESSION 1. Focal soft disc protrusion into the right neural foramen at L4-5 extending lateral to the neural foramen touching the right L4 nerve. 2. Moderately severe spinal stenosis at L4-5. Electronically Signed   By: Lorriane Shire M.D.   On: 05/13/2017 19:02    Procedures Procedures (including critical care time)  Medications Ordered in ED Medications  LORazepam (ATIVAN) injection 1 mg (1 mg Intravenous Given 05/13/17 1737)     Initial Impression / Assessment and Plan / ED Course  I have reviewed the triage vital signs and the nursing notes.  Pertinent labs & imaging results that were available during my care of the patient were reviewed by me and considered in my medical decision making (see chart for details).     This  is a 71 y.o. Male who presents to the emergency department complaining of right leg numbness and foot drop ongoing for the past 4 days.  Patient reports his symptoms are worse about 4 days ago when he felt like he was dragging his toes across the floor on his right side.  He reports this is somewhat improved today.  He still reports feeling numbness and tingling across the dorsum of his right foot.  He also reports numbness and tingling in his right buttocks and into his right low back.  He denies any injury or trauma to his back.  He reports he  also has some chronic feelings of lightheadedness/dizziness ongoing for over a year.  He had a recent MRI of his brain that he reports showed only early Alzheimer's disease.  He reports he has trouble sensing where his foot is other weakness, numbness or tingling.  He denies any right arm weakness or tingling.  No facial droop. On exam the patient is afebrile nontoxic-appearing.  He has no midline back tenderness to palpation.  He ambulates in the room without  foot drop.  He has good strength to his bilateral lower extremities on my exam.  Patient reports his symptoms seem to be much better today than they have been recently.  He reports symptoms were the worst about 4 days ago.  Wife states that 4 days ago he was dragging his toes when walking. We will obtain lumbar and thoracic MRI.  MRI of his thoracic and lumbar spine show focal soft disc protrusion into the right neural foramen at L4 through 5 extending lateral to the neural foramen touching the right L4 nerve.  This is the suspected cause of the patient's symptoms here recently.  Currently he is feeling well and not having any foot drop.  We will have him follow-up closely with neurosurgery as an outpatient.  Tylenol as needed for pain.  He is having minimal to no pain at this time.  I discussed strict and specific return precautions. I advised the patient to follow-up with their primary care provider this week. I advised the patient to return to the emergency department with new or worsening symptoms or new concerns. The patient and his family verbalized understanding and agreement with plan.   This patient was discussed with and evaluated by Dr. Laverta Baltimore who agrees with assessment and plan.   Final Clinical Impressions(s) / ED Diagnoses   Final diagnoses:  Lumbar herniated disc    ED Discharge Orders        Ordered    acetaminophen (TYLENOL) 500 MG tablet  Every 6 hours PRN     05/13/17 1953       Waynetta Pean, PA-C 05/13/17 2001      Long, Wonda Olds, MD 05/14/17 1409

## 2017-05-13 NOTE — ED Notes (Signed)
Patient transported to MRI 

## 2017-05-13 NOTE — ED Triage Notes (Signed)
Pt presents with onset of R foot numbness that began 4 days ago.  Pt reports sensation radiates up his R leg and into R buttock.  Pt denies any injury climbing ladder the day before.  Pt endorses headache.  Pt seen at urgent care and referred here for "either a disc problem or a mini-stroke".

## 2017-05-24 DIAGNOSIS — M9905 Segmental and somatic dysfunction of pelvic region: Secondary | ICD-10-CM | POA: Diagnosis not present

## 2017-05-24 DIAGNOSIS — M9903 Segmental and somatic dysfunction of lumbar region: Secondary | ICD-10-CM | POA: Diagnosis not present

## 2017-05-24 DIAGNOSIS — M5441 Lumbago with sciatica, right side: Secondary | ICD-10-CM | POA: Diagnosis not present

## 2017-05-24 DIAGNOSIS — M9901 Segmental and somatic dysfunction of cervical region: Secondary | ICD-10-CM | POA: Diagnosis not present

## 2017-05-26 DIAGNOSIS — M9903 Segmental and somatic dysfunction of lumbar region: Secondary | ICD-10-CM | POA: Diagnosis not present

## 2017-05-26 DIAGNOSIS — M5441 Lumbago with sciatica, right side: Secondary | ICD-10-CM | POA: Diagnosis not present

## 2017-05-26 DIAGNOSIS — M9901 Segmental and somatic dysfunction of cervical region: Secondary | ICD-10-CM | POA: Diagnosis not present

## 2017-05-26 DIAGNOSIS — M9905 Segmental and somatic dysfunction of pelvic region: Secondary | ICD-10-CM | POA: Diagnosis not present

## 2017-06-03 DIAGNOSIS — M9905 Segmental and somatic dysfunction of pelvic region: Secondary | ICD-10-CM | POA: Diagnosis not present

## 2017-06-03 DIAGNOSIS — M9903 Segmental and somatic dysfunction of lumbar region: Secondary | ICD-10-CM | POA: Diagnosis not present

## 2017-06-03 DIAGNOSIS — M5441 Lumbago with sciatica, right side: Secondary | ICD-10-CM | POA: Diagnosis not present

## 2017-06-03 DIAGNOSIS — M9901 Segmental and somatic dysfunction of cervical region: Secondary | ICD-10-CM | POA: Diagnosis not present

## 2017-06-08 DIAGNOSIS — M9905 Segmental and somatic dysfunction of pelvic region: Secondary | ICD-10-CM | POA: Diagnosis not present

## 2017-06-08 DIAGNOSIS — E782 Mixed hyperlipidemia: Secondary | ICD-10-CM | POA: Diagnosis not present

## 2017-06-08 DIAGNOSIS — D518 Other vitamin B12 deficiency anemias: Secondary | ICD-10-CM | POA: Diagnosis not present

## 2017-06-08 DIAGNOSIS — R42 Dizziness and giddiness: Secondary | ICD-10-CM | POA: Diagnosis not present

## 2017-06-08 DIAGNOSIS — M5441 Lumbago with sciatica, right side: Secondary | ICD-10-CM | POA: Diagnosis not present

## 2017-06-08 DIAGNOSIS — M9903 Segmental and somatic dysfunction of lumbar region: Secondary | ICD-10-CM | POA: Diagnosis not present

## 2017-06-08 DIAGNOSIS — Z125 Encounter for screening for malignant neoplasm of prostate: Secondary | ICD-10-CM | POA: Diagnosis not present

## 2017-06-08 DIAGNOSIS — I10 Essential (primary) hypertension: Secondary | ICD-10-CM | POA: Diagnosis not present

## 2017-06-08 DIAGNOSIS — N400 Enlarged prostate without lower urinary tract symptoms: Secondary | ICD-10-CM | POA: Diagnosis not present

## 2017-06-08 DIAGNOSIS — E1142 Type 2 diabetes mellitus with diabetic polyneuropathy: Secondary | ICD-10-CM | POA: Diagnosis not present

## 2017-06-08 DIAGNOSIS — F5102 Adjustment insomnia: Secondary | ICD-10-CM | POA: Diagnosis not present

## 2017-06-08 DIAGNOSIS — M9901 Segmental and somatic dysfunction of cervical region: Secondary | ICD-10-CM | POA: Diagnosis not present

## 2017-06-16 DIAGNOSIS — M9904 Segmental and somatic dysfunction of sacral region: Secondary | ICD-10-CM | POA: Diagnosis not present

## 2017-06-16 DIAGNOSIS — M9905 Segmental and somatic dysfunction of pelvic region: Secondary | ICD-10-CM | POA: Diagnosis not present

## 2017-06-16 DIAGNOSIS — M9903 Segmental and somatic dysfunction of lumbar region: Secondary | ICD-10-CM | POA: Diagnosis not present

## 2017-06-16 DIAGNOSIS — M9902 Segmental and somatic dysfunction of thoracic region: Secondary | ICD-10-CM | POA: Diagnosis not present

## 2017-06-27 DIAGNOSIS — J441 Chronic obstructive pulmonary disease with (acute) exacerbation: Secondary | ICD-10-CM | POA: Diagnosis not present

## 2017-06-27 DIAGNOSIS — N400 Enlarged prostate without lower urinary tract symptoms: Secondary | ICD-10-CM | POA: Diagnosis not present

## 2017-06-27 DIAGNOSIS — Z79899 Other long term (current) drug therapy: Secondary | ICD-10-CM | POA: Diagnosis not present

## 2017-06-27 DIAGNOSIS — I10 Essential (primary) hypertension: Secondary | ICD-10-CM | POA: Diagnosis not present

## 2017-06-27 DIAGNOSIS — R0602 Shortness of breath: Secondary | ICD-10-CM | POA: Diagnosis not present

## 2017-06-27 DIAGNOSIS — Z7982 Long term (current) use of aspirin: Secondary | ICD-10-CM | POA: Diagnosis not present

## 2017-06-27 DIAGNOSIS — K219 Gastro-esophageal reflux disease without esophagitis: Secondary | ICD-10-CM | POA: Diagnosis not present

## 2017-06-27 DIAGNOSIS — J449 Chronic obstructive pulmonary disease, unspecified: Secondary | ICD-10-CM | POA: Diagnosis not present

## 2017-06-27 DIAGNOSIS — J9601 Acute respiratory failure with hypoxia: Secondary | ICD-10-CM | POA: Diagnosis not present

## 2017-06-27 DIAGNOSIS — E876 Hypokalemia: Secondary | ICD-10-CM | POA: Diagnosis not present

## 2017-06-27 DIAGNOSIS — E119 Type 2 diabetes mellitus without complications: Secondary | ICD-10-CM | POA: Diagnosis not present

## 2017-06-27 DIAGNOSIS — J9691 Respiratory failure, unspecified with hypoxia: Secondary | ICD-10-CM | POA: Diagnosis not present

## 2017-06-27 DIAGNOSIS — J111 Influenza due to unidentified influenza virus with other respiratory manifestations: Secondary | ICD-10-CM | POA: Diagnosis not present

## 2017-07-06 DIAGNOSIS — J441 Chronic obstructive pulmonary disease with (acute) exacerbation: Secondary | ICD-10-CM | POA: Diagnosis not present

## 2017-07-19 DIAGNOSIS — J441 Chronic obstructive pulmonary disease with (acute) exacerbation: Secondary | ICD-10-CM | POA: Diagnosis not present

## 2017-08-30 DIAGNOSIS — L237 Allergic contact dermatitis due to plants, except food: Secondary | ICD-10-CM | POA: Diagnosis not present

## 2017-10-06 DIAGNOSIS — M5126 Other intervertebral disc displacement, lumbar region: Secondary | ICD-10-CM | POA: Diagnosis not present

## 2017-10-06 DIAGNOSIS — M5416 Radiculopathy, lumbar region: Secondary | ICD-10-CM | POA: Diagnosis not present

## 2017-10-06 DIAGNOSIS — M545 Low back pain: Secondary | ICD-10-CM | POA: Diagnosis not present

## 2017-10-06 DIAGNOSIS — I10 Essential (primary) hypertension: Secondary | ICD-10-CM | POA: Diagnosis not present

## 2017-10-07 DIAGNOSIS — E1142 Type 2 diabetes mellitus with diabetic polyneuropathy: Secondary | ICD-10-CM | POA: Diagnosis not present

## 2017-10-07 DIAGNOSIS — R42 Dizziness and giddiness: Secondary | ICD-10-CM | POA: Diagnosis not present

## 2017-10-07 DIAGNOSIS — N400 Enlarged prostate without lower urinary tract symptoms: Secondary | ICD-10-CM | POA: Diagnosis not present

## 2017-10-07 DIAGNOSIS — E271 Primary adrenocortical insufficiency: Secondary | ICD-10-CM | POA: Diagnosis not present

## 2017-10-07 DIAGNOSIS — F5102 Adjustment insomnia: Secondary | ICD-10-CM | POA: Diagnosis not present

## 2017-10-07 DIAGNOSIS — I10 Essential (primary) hypertension: Secondary | ICD-10-CM | POA: Diagnosis not present

## 2017-10-07 DIAGNOSIS — E782 Mixed hyperlipidemia: Secondary | ICD-10-CM | POA: Diagnosis not present

## 2017-11-17 DIAGNOSIS — J441 Chronic obstructive pulmonary disease with (acute) exacerbation: Secondary | ICD-10-CM | POA: Diagnosis not present

## 2017-11-17 DIAGNOSIS — R911 Solitary pulmonary nodule: Secondary | ICD-10-CM | POA: Diagnosis not present

## 2017-11-30 DIAGNOSIS — R911 Solitary pulmonary nodule: Secondary | ICD-10-CM | POA: Diagnosis not present

## 2017-12-14 DIAGNOSIS — I700 Atherosclerosis of aorta: Secondary | ICD-10-CM | POA: Diagnosis not present

## 2017-12-14 DIAGNOSIS — N281 Cyst of kidney, acquired: Secondary | ICD-10-CM | POA: Diagnosis not present

## 2017-12-14 DIAGNOSIS — N2889 Other specified disorders of kidney and ureter: Secondary | ICD-10-CM | POA: Diagnosis not present

## 2017-12-22 DIAGNOSIS — M5126 Other intervertebral disc displacement, lumbar region: Secondary | ICD-10-CM | POA: Diagnosis not present

## 2017-12-22 DIAGNOSIS — M545 Low back pain: Secondary | ICD-10-CM | POA: Diagnosis not present

## 2017-12-22 DIAGNOSIS — M5416 Radiculopathy, lumbar region: Secondary | ICD-10-CM | POA: Diagnosis not present

## 2017-12-22 DIAGNOSIS — I10 Essential (primary) hypertension: Secondary | ICD-10-CM | POA: Diagnosis not present

## 2018-02-07 DIAGNOSIS — M5416 Radiculopathy, lumbar region: Secondary | ICD-10-CM | POA: Diagnosis not present

## 2018-02-08 DIAGNOSIS — F5102 Adjustment insomnia: Secondary | ICD-10-CM | POA: Diagnosis not present

## 2018-02-08 DIAGNOSIS — E782 Mixed hyperlipidemia: Secondary | ICD-10-CM | POA: Diagnosis not present

## 2018-02-08 DIAGNOSIS — E1121 Type 2 diabetes mellitus with diabetic nephropathy: Secondary | ICD-10-CM | POA: Diagnosis not present

## 2018-02-08 DIAGNOSIS — R42 Dizziness and giddiness: Secondary | ICD-10-CM | POA: Diagnosis not present

## 2018-02-08 DIAGNOSIS — I10 Essential (primary) hypertension: Secondary | ICD-10-CM | POA: Diagnosis not present

## 2018-02-08 DIAGNOSIS — E1142 Type 2 diabetes mellitus with diabetic polyneuropathy: Secondary | ICD-10-CM | POA: Diagnosis not present

## 2018-03-08 ENCOUNTER — Other Ambulatory Visit: Payer: Self-pay

## 2018-03-08 NOTE — Patient Outreach (Signed)
[facility]) Care Management  03/08/2018  Wayne [name] 01-Oct-1945 [id number]   Medication Adherence call to [name] spoke with patients wife she said he is still taking Losartan 100 mg and just order it today from [facility]. Wayne [name] is showing past due under [place].   [place] Management Direct Dial [phone]  Fax [phone] [email or web address]

## 2018-03-11 DIAGNOSIS — E1142 Type 2 diabetes mellitus with diabetic polyneuropathy: Secondary | ICD-10-CM | POA: Diagnosis not present

## 2018-03-11 DIAGNOSIS — I10 Essential (primary) hypertension: Secondary | ICD-10-CM | POA: Diagnosis not present

## 2018-03-11 DIAGNOSIS — F5102 Adjustment insomnia: Secondary | ICD-10-CM | POA: Diagnosis not present

## 2018-03-17 DIAGNOSIS — M5416 Radiculopathy, lumbar region: Secondary | ICD-10-CM | POA: Diagnosis not present

## 2018-03-22 DIAGNOSIS — J410 Simple chronic bronchitis: Secondary | ICD-10-CM | POA: Diagnosis not present

## 2018-03-22 DIAGNOSIS — I10 Essential (primary) hypertension: Secondary | ICD-10-CM | POA: Diagnosis not present

## 2018-03-22 DIAGNOSIS — Z23 Encounter for immunization: Secondary | ICD-10-CM | POA: Diagnosis not present

## 2018-03-22 DIAGNOSIS — E1142 Type 2 diabetes mellitus with diabetic polyneuropathy: Secondary | ICD-10-CM | POA: Diagnosis not present

## 2018-03-22 DIAGNOSIS — R42 Dizziness and giddiness: Secondary | ICD-10-CM | POA: Diagnosis not present

## 2018-03-23 DIAGNOSIS — Z0001 Encounter for general adult medical examination with abnormal findings: Secondary | ICD-10-CM | POA: Diagnosis not present

## 2018-03-23 DIAGNOSIS — I700 Atherosclerosis of aorta: Secondary | ICD-10-CM | POA: Diagnosis not present

## 2018-03-23 DIAGNOSIS — Z1211 Encounter for screening for malignant neoplasm of colon: Secondary | ICD-10-CM | POA: Diagnosis not present

## 2018-03-23 DIAGNOSIS — I712 Thoracic aortic aneurysm, without rupture: Secondary | ICD-10-CM | POA: Diagnosis not present

## 2018-03-25 DIAGNOSIS — M542 Cervicalgia: Secondary | ICD-10-CM | POA: Diagnosis not present

## 2018-03-25 DIAGNOSIS — M4682 Other specified inflammatory spondylopathies, cervical region: Secondary | ICD-10-CM | POA: Diagnosis not present

## 2018-03-28 DIAGNOSIS — R51 Headache: Secondary | ICD-10-CM | POA: Diagnosis not present

## 2018-03-28 DIAGNOSIS — R42 Dizziness and giddiness: Secondary | ICD-10-CM | POA: Diagnosis not present

## 2018-03-31 DIAGNOSIS — Z1211 Encounter for screening for malignant neoplasm of colon: Secondary | ICD-10-CM | POA: Diagnosis not present

## 2018-03-31 DIAGNOSIS — Z1212 Encounter for screening for malignant neoplasm of rectum: Secondary | ICD-10-CM | POA: Diagnosis not present

## 2018-04-11 DIAGNOSIS — H6505 Acute serous otitis media, recurrent, left ear: Secondary | ICD-10-CM | POA: Diagnosis not present

## 2018-04-18 DIAGNOSIS — E1142 Type 2 diabetes mellitus with diabetic polyneuropathy: Secondary | ICD-10-CM | POA: Diagnosis not present

## 2018-04-18 DIAGNOSIS — E782 Mixed hyperlipidemia: Secondary | ICD-10-CM | POA: Diagnosis not present

## 2018-04-26 DIAGNOSIS — I10 Essential (primary) hypertension: Secondary | ICD-10-CM | POA: Diagnosis not present

## 2018-04-26 DIAGNOSIS — M5126 Other intervertebral disc displacement, lumbar region: Secondary | ICD-10-CM | POA: Diagnosis not present

## 2018-04-26 DIAGNOSIS — M5416 Radiculopathy, lumbar region: Secondary | ICD-10-CM | POA: Diagnosis not present

## 2018-05-17 DIAGNOSIS — H6505 Acute serous otitis media, recurrent, left ear: Secondary | ICD-10-CM | POA: Diagnosis not present

## 2018-05-17 DIAGNOSIS — I10 Essential (primary) hypertension: Secondary | ICD-10-CM | POA: Diagnosis not present

## 2018-05-17 DIAGNOSIS — E1142 Type 2 diabetes mellitus with diabetic polyneuropathy: Secondary | ICD-10-CM | POA: Diagnosis not present

## 2018-05-17 DIAGNOSIS — E782 Mixed hyperlipidemia: Secondary | ICD-10-CM | POA: Diagnosis not present

## 2018-05-17 DIAGNOSIS — E1121 Type 2 diabetes mellitus with diabetic nephropathy: Secondary | ICD-10-CM | POA: Diagnosis not present

## 2018-05-18 DIAGNOSIS — R809 Proteinuria, unspecified: Secondary | ICD-10-CM | POA: Diagnosis not present

## 2018-05-25 ENCOUNTER — Encounter: Payer: Medicare Other | Admitting: Surgery

## 2018-05-30 DIAGNOSIS — H6505 Acute serous otitis media, recurrent, left ear: Secondary | ICD-10-CM | POA: Diagnosis not present

## 2018-06-16 DIAGNOSIS — M5126 Other intervertebral disc displacement, lumbar region: Secondary | ICD-10-CM | POA: Diagnosis not present

## 2018-06-17 DIAGNOSIS — H6505 Acute serous otitis media, recurrent, left ear: Secondary | ICD-10-CM | POA: Diagnosis not present

## 2018-06-17 DIAGNOSIS — J018 Other acute sinusitis: Secondary | ICD-10-CM | POA: Diagnosis not present

## 2018-07-12 DIAGNOSIS — H9319 Tinnitus, unspecified ear: Secondary | ICD-10-CM | POA: Diagnosis not present

## 2018-07-12 DIAGNOSIS — J343 Hypertrophy of nasal turbinates: Secondary | ICD-10-CM | POA: Diagnosis not present

## 2018-07-12 DIAGNOSIS — J342 Deviated nasal septum: Secondary | ICD-10-CM | POA: Diagnosis not present

## 2018-07-12 DIAGNOSIS — H938X2 Other specified disorders of left ear: Secondary | ICD-10-CM | POA: Diagnosis not present

## 2018-07-12 DIAGNOSIS — J3489 Other specified disorders of nose and nasal sinuses: Secondary | ICD-10-CM | POA: Diagnosis not present

## 2018-07-14 DIAGNOSIS — E1142 Type 2 diabetes mellitus with diabetic polyneuropathy: Secondary | ICD-10-CM | POA: Diagnosis not present

## 2018-09-13 DIAGNOSIS — M5416 Radiculopathy, lumbar region: Secondary | ICD-10-CM | POA: Diagnosis not present

## 2018-09-26 DIAGNOSIS — F5101 Primary insomnia: Secondary | ICD-10-CM | POA: Diagnosis not present

## 2018-09-26 DIAGNOSIS — E1142 Type 2 diabetes mellitus with diabetic polyneuropathy: Secondary | ICD-10-CM | POA: Diagnosis not present

## 2018-09-26 DIAGNOSIS — J410 Simple chronic bronchitis: Secondary | ICD-10-CM | POA: Diagnosis not present

## 2018-09-26 DIAGNOSIS — I10 Essential (primary) hypertension: Secondary | ICD-10-CM | POA: Diagnosis not present

## 2018-10-19 DIAGNOSIS — M47816 Spondylosis without myelopathy or radiculopathy, lumbar region: Secondary | ICD-10-CM | POA: Diagnosis not present

## 2018-11-11 ENCOUNTER — Other Ambulatory Visit: Payer: Self-pay

## 2018-11-11 NOTE — Patient Outreach (Signed)
[facility]) Care Management  11/11/2018  Wayne [name] Aug 21, 1945 [id number]   Medication Adherence call to Mr. Wayne [name] [employer] Ident[employer] Verify spoke with patient he is due on Atorvastatin 10 mg patient explain he takes 1 tablet daily patient has medication at this time but, patient ask if we can place an order thru [facility], [facility] will mail out with it 5-7 days. Mr. Wayne [name] is showing past due under [place].   [place] Management Direct Dial [phone]  Fax [phone] [name]

## 2018-11-28 DIAGNOSIS — M47816 Spondylosis without myelopathy or radiculopathy, lumbar region: Secondary | ICD-10-CM | POA: Diagnosis not present

## 2018-12-21 DIAGNOSIS — M9905 Segmental and somatic dysfunction of pelvic region: Secondary | ICD-10-CM | POA: Diagnosis not present

## 2018-12-21 DIAGNOSIS — M9904 Segmental and somatic dysfunction of sacral region: Secondary | ICD-10-CM | POA: Diagnosis not present

## 2018-12-21 DIAGNOSIS — M9902 Segmental and somatic dysfunction of thoracic region: Secondary | ICD-10-CM | POA: Diagnosis not present

## 2018-12-21 DIAGNOSIS — M9903 Segmental and somatic dysfunction of lumbar region: Secondary | ICD-10-CM | POA: Diagnosis not present

## 2018-12-23 DIAGNOSIS — M9904 Segmental and somatic dysfunction of sacral region: Secondary | ICD-10-CM | POA: Diagnosis not present

## 2018-12-23 DIAGNOSIS — M9905 Segmental and somatic dysfunction of pelvic region: Secondary | ICD-10-CM | POA: Diagnosis not present

## 2018-12-23 DIAGNOSIS — M9903 Segmental and somatic dysfunction of lumbar region: Secondary | ICD-10-CM | POA: Diagnosis not present

## 2018-12-23 DIAGNOSIS — M9902 Segmental and somatic dysfunction of thoracic region: Secondary | ICD-10-CM | POA: Diagnosis not present

## 2018-12-26 DIAGNOSIS — M9905 Segmental and somatic dysfunction of pelvic region: Secondary | ICD-10-CM | POA: Diagnosis not present

## 2018-12-26 DIAGNOSIS — M9904 Segmental and somatic dysfunction of sacral region: Secondary | ICD-10-CM | POA: Diagnosis not present

## 2018-12-26 DIAGNOSIS — M9902 Segmental and somatic dysfunction of thoracic region: Secondary | ICD-10-CM | POA: Diagnosis not present

## 2018-12-26 DIAGNOSIS — M9903 Segmental and somatic dysfunction of lumbar region: Secondary | ICD-10-CM | POA: Diagnosis not present

## 2018-12-27 DIAGNOSIS — I77810 Thoracic aortic ectasia: Secondary | ICD-10-CM | POA: Diagnosis not present

## 2018-12-27 DIAGNOSIS — I712 Thoracic aortic aneurysm, without rupture: Secondary | ICD-10-CM | POA: Diagnosis not present

## 2019-01-02 DIAGNOSIS — M9905 Segmental and somatic dysfunction of pelvic region: Secondary | ICD-10-CM | POA: Diagnosis not present

## 2019-01-02 DIAGNOSIS — M9904 Segmental and somatic dysfunction of sacral region: Secondary | ICD-10-CM | POA: Diagnosis not present

## 2019-01-02 DIAGNOSIS — M9903 Segmental and somatic dysfunction of lumbar region: Secondary | ICD-10-CM | POA: Diagnosis not present

## 2019-01-02 DIAGNOSIS — M9902 Segmental and somatic dysfunction of thoracic region: Secondary | ICD-10-CM | POA: Diagnosis not present

## 2019-01-10 DIAGNOSIS — L723 Sebaceous cyst: Secondary | ICD-10-CM | POA: Diagnosis not present

## 2019-01-16 DIAGNOSIS — I10 Essential (primary) hypertension: Secondary | ICD-10-CM | POA: Diagnosis not present

## 2019-01-16 DIAGNOSIS — Z23 Encounter for immunization: Secondary | ICD-10-CM | POA: Diagnosis not present

## 2019-01-16 DIAGNOSIS — E782 Mixed hyperlipidemia: Secondary | ICD-10-CM | POA: Diagnosis not present

## 2019-01-16 DIAGNOSIS — E1142 Type 2 diabetes mellitus with diabetic polyneuropathy: Secondary | ICD-10-CM | POA: Diagnosis not present

## 2019-01-16 DIAGNOSIS — R1312 Dysphagia, oropharyngeal phase: Secondary | ICD-10-CM | POA: Diagnosis not present

## 2019-01-17 DIAGNOSIS — I10 Essential (primary) hypertension: Secondary | ICD-10-CM | POA: Diagnosis not present

## 2019-01-17 DIAGNOSIS — J449 Chronic obstructive pulmonary disease, unspecified: Secondary | ICD-10-CM | POA: Diagnosis not present

## 2019-01-17 DIAGNOSIS — L723 Sebaceous cyst: Secondary | ICD-10-CM | POA: Diagnosis not present

## 2019-01-17 DIAGNOSIS — L0211 Cutaneous abscess of neck: Secondary | ICD-10-CM | POA: Diagnosis not present

## 2019-01-27 DIAGNOSIS — M9902 Segmental and somatic dysfunction of thoracic region: Secondary | ICD-10-CM | POA: Diagnosis not present

## 2019-01-27 DIAGNOSIS — M9904 Segmental and somatic dysfunction of sacral region: Secondary | ICD-10-CM | POA: Diagnosis not present

## 2019-01-27 DIAGNOSIS — M9903 Segmental and somatic dysfunction of lumbar region: Secondary | ICD-10-CM | POA: Diagnosis not present

## 2019-01-27 DIAGNOSIS — M9905 Segmental and somatic dysfunction of pelvic region: Secondary | ICD-10-CM | POA: Diagnosis not present

## 2019-02-03 DIAGNOSIS — M9902 Segmental and somatic dysfunction of thoracic region: Secondary | ICD-10-CM | POA: Diagnosis not present

## 2019-02-03 DIAGNOSIS — Z720 Tobacco use: Secondary | ICD-10-CM | POA: Insufficient documentation

## 2019-02-03 DIAGNOSIS — M9903 Segmental and somatic dysfunction of lumbar region: Secondary | ICD-10-CM | POA: Diagnosis not present

## 2019-02-03 DIAGNOSIS — M9904 Segmental and somatic dysfunction of sacral region: Secondary | ICD-10-CM | POA: Diagnosis not present

## 2019-02-03 DIAGNOSIS — R079 Chest pain, unspecified: Secondary | ICD-10-CM | POA: Insufficient documentation

## 2019-02-03 DIAGNOSIS — M9905 Segmental and somatic dysfunction of pelvic region: Secondary | ICD-10-CM | POA: Diagnosis not present

## 2019-02-07 DIAGNOSIS — I10 Essential (primary) hypertension: Secondary | ICD-10-CM | POA: Diagnosis not present

## 2019-02-07 DIAGNOSIS — I498 Other specified cardiac arrhythmias: Secondary | ICD-10-CM | POA: Diagnosis not present

## 2019-02-07 DIAGNOSIS — E782 Mixed hyperlipidemia: Secondary | ICD-10-CM | POA: Diagnosis not present

## 2019-02-07 DIAGNOSIS — R079 Chest pain, unspecified: Secondary | ICD-10-CM | POA: Diagnosis not present

## 2019-02-07 DIAGNOSIS — I2510 Atherosclerotic heart disease of native coronary artery without angina pectoris: Secondary | ICD-10-CM | POA: Diagnosis not present

## 2019-02-07 DIAGNOSIS — I499 Cardiac arrhythmia, unspecified: Secondary | ICD-10-CM | POA: Diagnosis not present

## 2019-02-07 DIAGNOSIS — Z789 Other specified health status: Secondary | ICD-10-CM | POA: Diagnosis not present

## 2019-02-08 DIAGNOSIS — E041 Nontoxic single thyroid nodule: Secondary | ICD-10-CM | POA: Diagnosis not present

## 2019-02-08 DIAGNOSIS — R42 Dizziness and giddiness: Secondary | ICD-10-CM | POA: Diagnosis not present

## 2019-02-08 DIAGNOSIS — Z23 Encounter for immunization: Secondary | ICD-10-CM | POA: Diagnosis not present

## 2019-02-08 DIAGNOSIS — I700 Atherosclerosis of aorta: Secondary | ICD-10-CM | POA: Diagnosis not present

## 2019-02-08 DIAGNOSIS — R1312 Dysphagia, oropharyngeal phase: Secondary | ICD-10-CM | POA: Diagnosis not present

## 2019-02-10 DIAGNOSIS — M9904 Segmental and somatic dysfunction of sacral region: Secondary | ICD-10-CM | POA: Diagnosis not present

## 2019-02-10 DIAGNOSIS — M9902 Segmental and somatic dysfunction of thoracic region: Secondary | ICD-10-CM | POA: Diagnosis not present

## 2019-02-10 DIAGNOSIS — M9905 Segmental and somatic dysfunction of pelvic region: Secondary | ICD-10-CM | POA: Diagnosis not present

## 2019-02-10 DIAGNOSIS — M9903 Segmental and somatic dysfunction of lumbar region: Secondary | ICD-10-CM | POA: Diagnosis not present

## 2019-02-13 ENCOUNTER — Other Ambulatory Visit: Payer: Self-pay

## 2019-02-13 NOTE — Patient Outreach (Signed)
[facility]) Care Management  02/13/2019  SHLOIMA CASWELL Sr 04/11/1946 [id number]   Medication Adherence call to Mr. Dawon Sokolowski [employer] Identifiers Verify spoke with patient he is past due on Atorvastatin 10 mg spoke with patients wife she explain he takes 1 tablet daily she ask if we can call [facility] to order this medicataions and two other medications for her.[facility] will mail out with in 5-7 days. Mr. Orvan Seen is showing past due under [place].   [place] Management Direct Dial [phone]  Fax [phone] [email or web address]

## 2019-02-14 DIAGNOSIS — I2510 Atherosclerotic heart disease of native coronary artery without angina pectoris: Secondary | ICD-10-CM | POA: Diagnosis not present

## 2019-02-21 DIAGNOSIS — I2510 Atherosclerotic heart disease of native coronary artery without angina pectoris: Secondary | ICD-10-CM | POA: Diagnosis not present

## 2019-02-24 ENCOUNTER — Encounter: Payer: Self-pay | Admitting: Family Medicine

## 2019-02-27 DIAGNOSIS — E041 Nontoxic single thyroid nodule: Secondary | ICD-10-CM | POA: Diagnosis not present

## 2019-02-27 DIAGNOSIS — E042 Nontoxic multinodular goiter: Secondary | ICD-10-CM | POA: Diagnosis not present

## 2019-02-27 DIAGNOSIS — R1312 Dysphagia, oropharyngeal phase: Secondary | ICD-10-CM | POA: Diagnosis not present

## 2019-03-06 DIAGNOSIS — I2510 Atherosclerotic heart disease of native coronary artery without angina pectoris: Secondary | ICD-10-CM | POA: Insufficient documentation

## 2019-03-07 DIAGNOSIS — Z7982 Long term (current) use of aspirin: Secondary | ICD-10-CM | POA: Diagnosis not present

## 2019-03-07 DIAGNOSIS — I10 Essential (primary) hypertension: Secondary | ICD-10-CM | POA: Diagnosis not present

## 2019-03-07 DIAGNOSIS — I2510 Atherosclerotic heart disease of native coronary artery without angina pectoris: Secondary | ICD-10-CM | POA: Diagnosis not present

## 2019-03-07 DIAGNOSIS — E782 Mixed hyperlipidemia: Secondary | ICD-10-CM | POA: Diagnosis not present

## 2019-03-07 DIAGNOSIS — Z720 Tobacco use: Secondary | ICD-10-CM | POA: Diagnosis not present

## 2019-04-07 IMAGING — MR MR LUMBAR SPINE W/O CM
5 of 13 series · 18 of 48 positions shown · non-contrast
Comparison: None.

CLINICAL DATA: Back pain. Rapidly progressive new neurological
deficit.

EXAM:
MRI THORACIC AND LUMBAR SPINE WITHOUT CONTRAST
TECHNIQUE: Multiplanar and multiecho pulse sequences of the thoracic and lumbar
spine were obtained without intravenous contrast.

[Series 3: T2 · sagittal · 3.0mm · 0.66mm/px · 2 of 13 slices shown (1 of 5)]
[im 1/13]
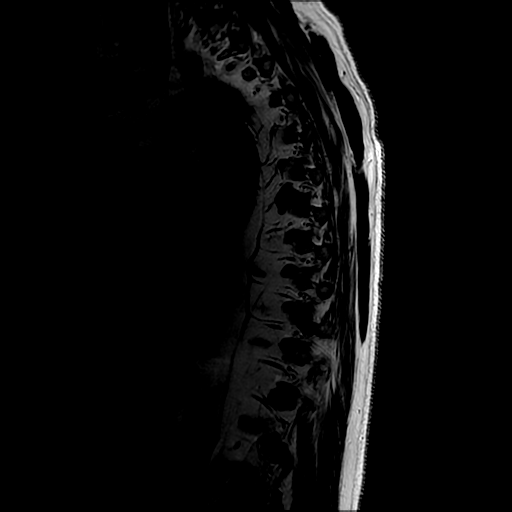
[im 13/13]
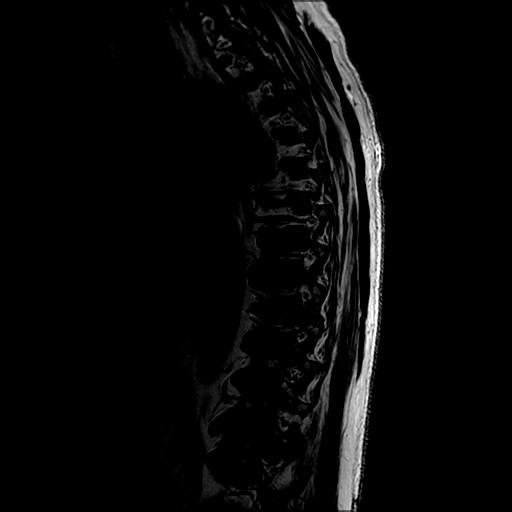

[Series 6: T2 · axial · 4.0mm · 0.39mm/px · z∈[-245,-121]mm · 4 of 25 slices shown (2 of 5)]
[im 1/25]
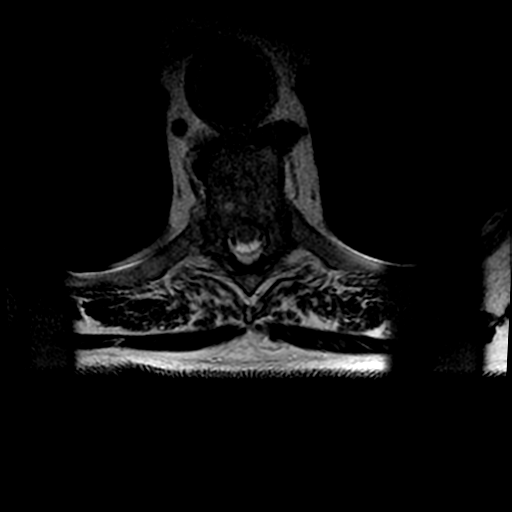
[im 9/25]
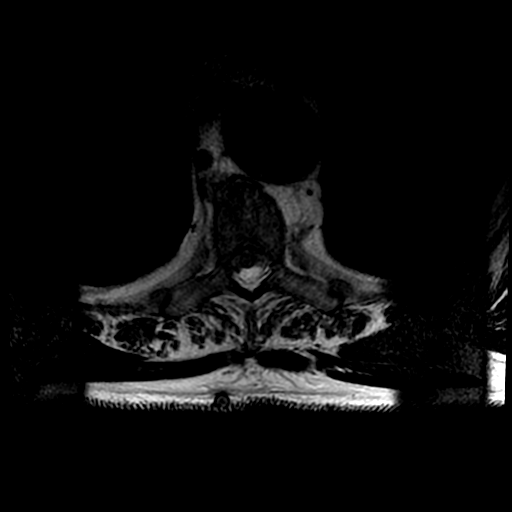
[im 17/25]
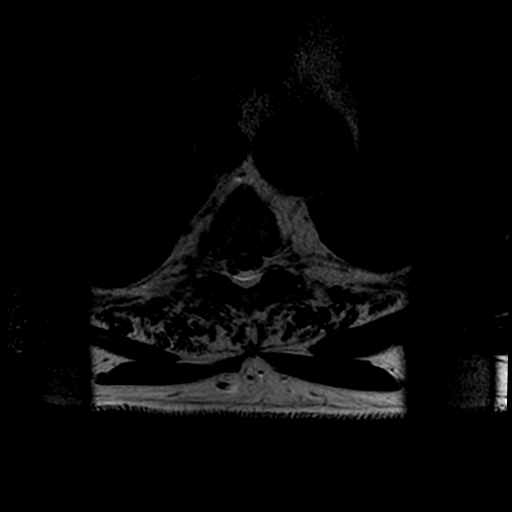
[im 25/25]
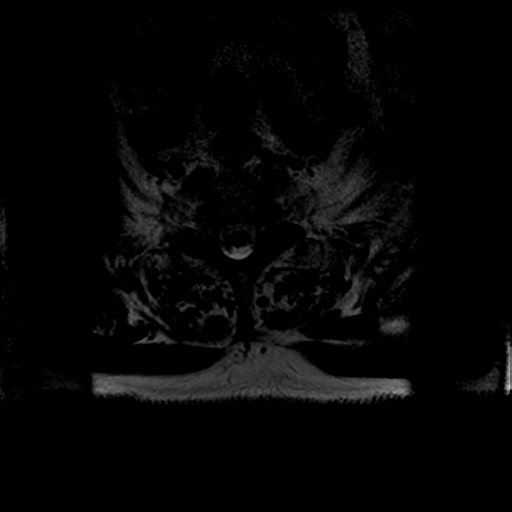

[Series 7: T2 · axial · 4.0mm · 0.39mm/px · z∈[-368,-215]mm · 5 of 29 slices shown (3 of 5)]
[im 1/29]
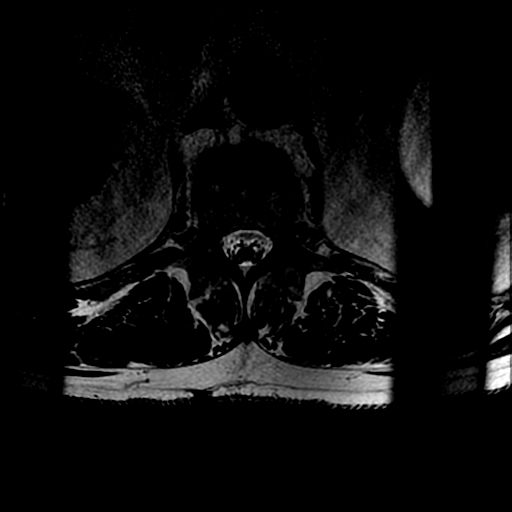
[im 8/29]
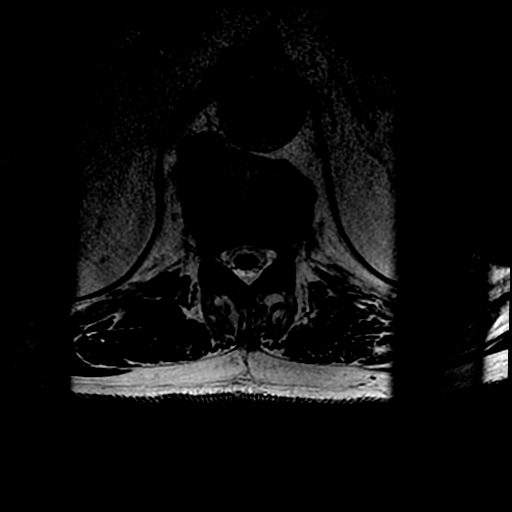
[im 15/29]
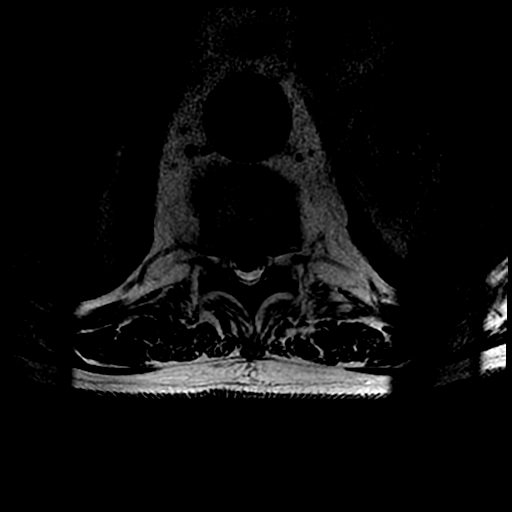
[im 22/29]
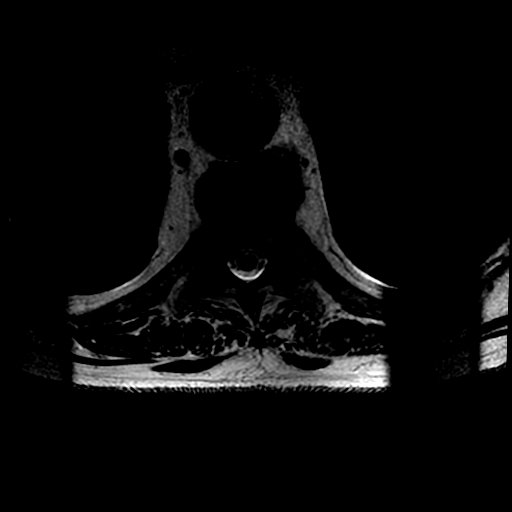
[im 29/29]
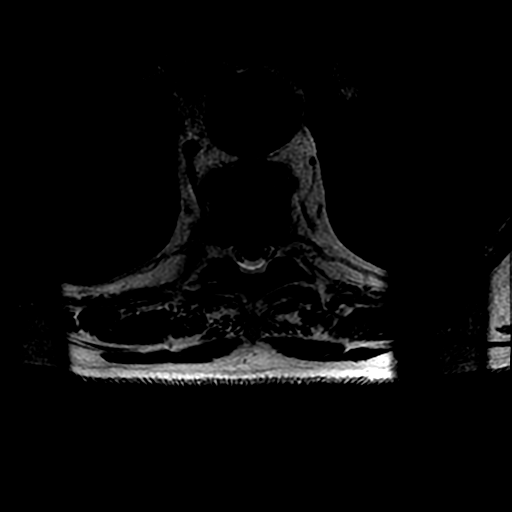

[Series 11: T2 · sagittal · 4.0mm · 0.55mm/px · 3 of 15 slices shown (4 of 5)]
[im 1/15]
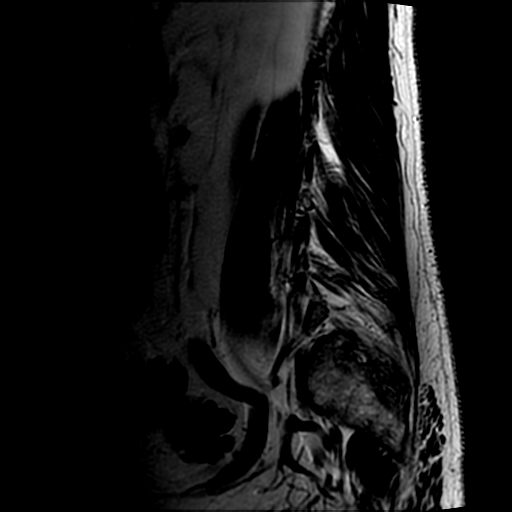
[im 8/15]
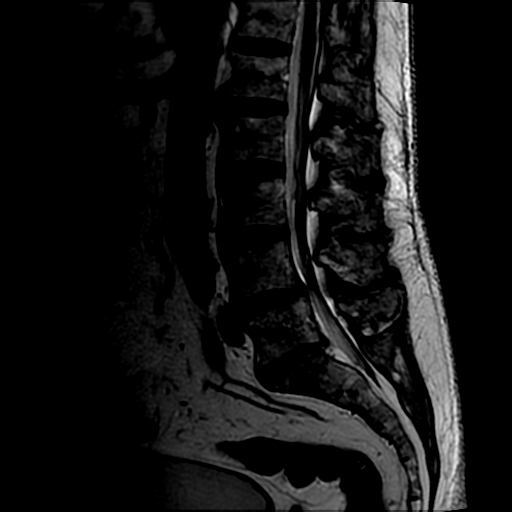
[im 15/15]
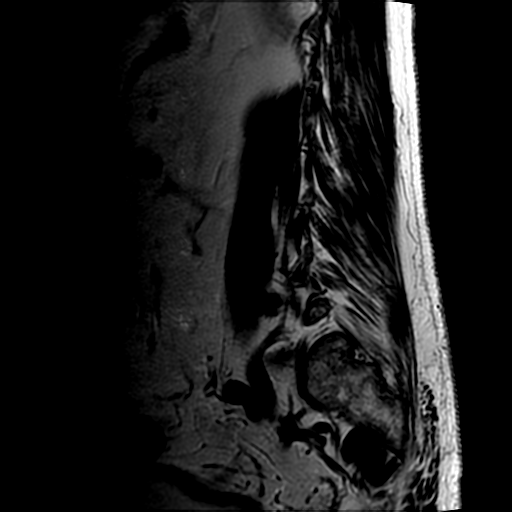

[Series 13: T2 · axial · 4.0mm · 0.39mm/px · z∈[-560,-447]mm · 4 of 35 slices shown (5 of 5)]
[im 1/35]
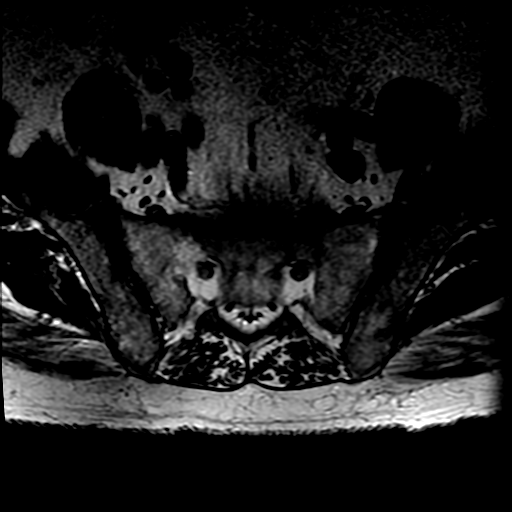
[im 7/35]
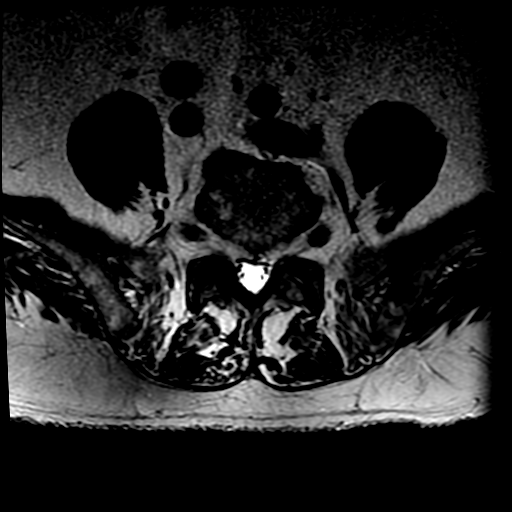
[im 14/35]
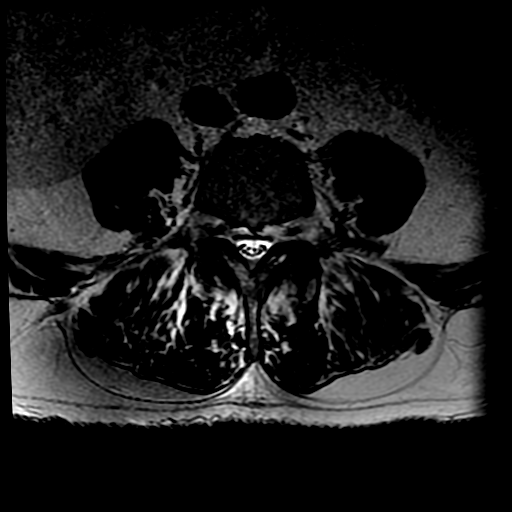
[im 21/35]
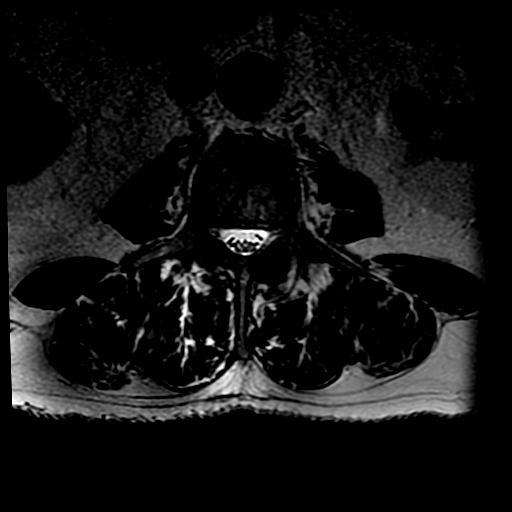

[18 of 48 positions shown; findings below may reference images not displayed]

FINDINGS: MRI THORACIC SPINE FINDINGS

Alignment:  Physiologic.

Vertebrae: No fracture, evidence of discitis, or bone lesion.

Cord:  Normal signal and morphology.

Paraspinal and other soft tissues: Dilatation of the descending
thoracic aorta to diameter 3.8 cm. Otherwise negative.

Disc levels:

T1-2 through T12-L1: No disc bulging or protrusion. No spinal or
foraminal stenosis. No significant facet joint disease.

MRI LUMBAR SPINE FINDINGS

Segmentation:  Standard.

Alignment: 3 mm spondylolisthesis at L4-5. 4 mm retrolisthesis at
L5-S1.

Vertebrae:  No fracture, evidence of discitis, or bone lesion.

Conus medullaris and cauda equina: Conus extends to the L1-2 level.
Conus and cauda equina appear normal.

Paraspinal and other soft tissues: 6.9 cm simple appearing cyst on
the upper pole the left kidney. Multiple small cysts in the
remainder of the left kidney. Multiple small cysts in the right
kidney. Maximum transverse diameter of the abdominal aorta is
cm.

Disc levels:

L1-2:  Slight disc desiccation.  Otherwise normal.

L2-3:  Normal.

L3-4: Normal disc. Moderate bilateral facet arthritis. No neural
impingement.

L4-5: Moderately severe bilateral facet arthritis with ligamentum
flavum hypertrophy. Grade 1 spondylolisthesis. Focal small soft disc
protrusion in and lateral to the right neural foramen which could
affect the right L4 nerve. Small bulge into the left neural foramen
and across the midline. The combination of the disc bulging,
spondylolisthesis, and hypertrophied facet joints and ligamentum
flavum combine to create moderately severe spinal stenosis. This is
best seen on image 24 of series 13.

L5-S1: Marked disc space narrowing. Slight retrolisthesis of L5 on
S1. No bulging or protrusion of the uncovered disc. No neural
impingement. No foraminal stenosis. Mild bilateral facet arthritis.
IMPRESSION: MR THORACIC SPINE IMPRESSION

No significant abnormality of the thoracic spine.

MR LUMBAR SPINE IMPRESSION

1. Focal soft disc protrusion into the right neural foramen at L4-5
extending lateral to the neural foramen touching the right L4 nerve.
2. Moderately severe spinal stenosis at L4-5.

## 2019-04-20 DIAGNOSIS — I10 Essential (primary) hypertension: Secondary | ICD-10-CM | POA: Diagnosis not present

## 2019-04-20 DIAGNOSIS — E782 Mixed hyperlipidemia: Secondary | ICD-10-CM | POA: Diagnosis not present

## 2019-04-20 DIAGNOSIS — E1142 Type 2 diabetes mellitus with diabetic polyneuropathy: Secondary | ICD-10-CM | POA: Diagnosis not present

## 2019-04-20 DIAGNOSIS — I700 Atherosclerosis of aorta: Secondary | ICD-10-CM | POA: Diagnosis not present

## 2019-07-04 ENCOUNTER — Other Ambulatory Visit: Payer: Self-pay | Admitting: Family Medicine

## 2019-07-17 ENCOUNTER — Other Ambulatory Visit: Payer: Self-pay

## 2019-07-17 MED ORDER — FLUTICASONE PROPIONATE 50 MCG/ACT NA SUSP: 2.0000 | Freq: Every day | NASAL | 1 refills | Status: DC | PRN

## 2019-07-26 ENCOUNTER — Other Ambulatory Visit: Payer: Self-pay | Admitting: Family Medicine

## 2019-08-15 ENCOUNTER — Other Ambulatory Visit: Payer: Self-pay

## 2019-08-15 MED ORDER — TIZANIDINE HCL 2 MG PO TABS: 2.0000 mg | ORAL_TABLET | Freq: Four times a day (QID) | ORAL | 2 refills | Status: DC | PRN

## 2019-09-06 ENCOUNTER — Other Ambulatory Visit: Payer: Self-pay | Admitting: Family Medicine

## 2019-09-14 ENCOUNTER — Other Ambulatory Visit: Payer: Self-pay | Admitting: Physician Assistant

## 2019-09-14 ENCOUNTER — Other Ambulatory Visit: Payer: Self-pay | Admitting: Family Medicine

## 2019-09-14 MED ORDER — TIZANIDINE HCL 2 MG PO TABS: 2.0000 mg | ORAL_TABLET | Freq: Three times a day (TID) | ORAL | 0 refills | Status: DC | PRN

## 2019-10-13 ENCOUNTER — Other Ambulatory Visit: Payer: Self-pay | Admitting: Family Medicine

## 2019-10-17 ENCOUNTER — Other Ambulatory Visit: Payer: Self-pay | Admitting: Family Medicine

## 2019-10-18 ENCOUNTER — Encounter: Payer: Self-pay | Admitting: Family Medicine

## 2019-10-18 DIAGNOSIS — E782 Mixed hyperlipidemia: Secondary | ICD-10-CM | POA: Insufficient documentation

## 2019-10-18 DIAGNOSIS — I10 Essential (primary) hypertension: Secondary | ICD-10-CM | POA: Insufficient documentation

## 2019-10-18 DIAGNOSIS — E119 Type 2 diabetes mellitus without complications: Secondary | ICD-10-CM | POA: Insufficient documentation

## 2019-10-18 NOTE — Progress Notes (Signed)
Subjective:  Patient ID: Wayne [name], male    DOB: Jan 20, 1946  Age: 74 y.o. MRN: [id number]  Chief Complaint  Patient presents with  . Hypertension  . Hyperlipidemia  . Diabetes    HPI  Patient presents with type 2 diabetes mellitus with diabetic polyneuropathy.  Specifically, this is type 2, non-insulin requiring diabetes without complications.  Compliance with treatment has been good; he takes his medication as directed, maintains his diet and exercise regimen.   In regard to preventative care, he performs foot self-exams daily and his last ophthalmology exam was in 07/2018.  Currently on no diabetes medicines.  Pt presents with hyperlipidemia.  He cannot recall when the diagnosis of hypercholesterolemia was made.  Current treatment includes Lipitor, Aspirin 81 mg daily, and a low cholesterol/low fat diet.  Compliance with treatment has been good; he maintains his low cholesterol diet, follows up as directed, and maintains his exercise regimen.  He denies experiencing any hypercholesterolemia related symptoms.      Pt presents for follow up of hypertension.  His current cardiac medication regimen includes a calcium channel blocker ( AMLODIPINE 5 mg once daily), Metoprolol XL 25 mg once daily, and an angiotensin receptor blocker ( LOSARTAN 100 mg once daily ).  He is tolerating the medication well without side effects.  Compliance with treatment has been good; he takes his medication as directed, maintains his diet and exercise regimen, and follows up as directed.    Pt has COPD and becomes short of breath with ambulation. Can walk about 1/2 mile. Only using albuterol MDI.  Per patient no other inhalers helped.  BPH: Currently on flomax and finasteride which helps with urination.  Pt has back pain that runs up his spine under his left arm into his chest. Comes on an intermittent basis.  He has seen Dr Vertell Limber, a chiropractor, and Dr. Lovenia Shuck for an [facility].   Past Medical History:   Diagnosis Date  . BPH (benign prostatic hyperplasia)   . COPD (chronic obstructive pulmonary disease) ([place])   . Diabetes mellitus without complication ([place])   . GERD (gastroesophageal reflux disease)   . Hypertension   . Male erectile disorder   . Meniere's disease   . Mixed hyperlipidemia   . Nontoxic single thyroid nodule   . Somnolence   . Thoracic aortic aneurysm [facility])    Past Surgical History:  Procedure Laterality Date  . CATARACT EXTRACTION Bilateral   . ELBOW SURGERY Right     Family History  Problem Relation Age of Onset  . Heart attack Mother   . Diabetes Father   . COPD Father   . Diverticulitis Brother    Social History   Socioeconomic History  . Marital status: Married    Spouse name: Not on file  . Number of children: 3  . Years of education: Not on file  . Highest education level: Not on file  Occupational History  . Not on file  Tobacco Use  . Smoking status: Current Every Day Smoker  . Smokeless tobacco: Never Used  Substance and Sexual Activity  . Alcohol use: Yes    Comment: occasional  . Drug use: No  . Sexual activity: Not on file  Other Topics Concern  . Not on file  Social History Narrative  . Not on file   Social Determinants of Health   Financial Resource Strain:   . Difficulty of Paying Living Expenses:   Food Insecurity:   . Worried About Estate manager/land agent  of Food in the Last Year:   . [place] in the Last Year:   Transportation Needs:   . Lack of Transportation (Medical):   Marland Kitchen Lack of Transportation (Non-Medical):   Physical Activity:   . Days of Exercise per Week:   . Minutes of Exercise per Session:   Stress:   . Feeling of Stress :   Social Connections:   . Frequency of Communication with Friends and Family:   . Frequency of Social Gatherings with Friends and Family:   . Attends Religious Services:   . Active Member of Clubs or Organizations:   . Attends Archivist Meetings:   Marland Kitchen Marital Status:      Review of Systems  Constitutional: Negative for chills, diaphoresis, fatigue and fever.  HENT: Negative for congestion, ear pain and sore throat.   Respiratory: Positive for shortness of breath. Negative for cough.   Cardiovascular: Negative for chest pain (Left side of chest is sore) and leg swelling.  Gastrointestinal: Positive for constipation (prunes, miralax, stool softeners help.). Negative for abdominal pain, diarrhea, nausea and vomiting.  Endocrine: Positive for polydipsia. Negative for polyphagia and polyuria.  Genitourinary: Negative for dysuria and urgency.  Musculoskeletal: Positive for arthralgias, back pain and myalgias.  Neurological: Positive for weakness and headaches. Negative for dizziness.  Psychiatric/Behavioral: Negative for dysphoric mood. The patient is not nervous/anxious.      Objective:  BP 124/68   Pulse 72   Temp (!) 97.4 F (36.3 C)   Ht 5\' 7"  (1.702 m)   Wt 191 lb (86.6 kg)   SpO2 95%   BMI 29.91 kg/m   BP/Weight 10/19/2019 [id number]  Systolic BP [id number]  Diastolic BP 68 96 92  Wt. (Lbs) 191 193 195.5  BMI 29.91 30.23 30.62    Physical Exam Vitals reviewed.  Constitutional:      Appearance: Normal appearance. He is obese.  Cardiovascular:     Rate and Rhythm: Normal rate and regular rhythm.  Pulmonary:     Effort: Pulmonary effort is normal.     Breath sounds: Normal breath sounds.  Abdominal:     General: Abdomen is flat. Bowel sounds are normal.     Palpations: Abdomen is soft.  Neurological:     Mental Status: He is alert and oriented to person, place, and time.  Psychiatric:        Mood and Affect: Mood normal.        Behavior: Behavior normal.     Diabetic Foot Exam - Simple   No data filed       Lab Results  Component Value Date   WBC 8.4 10/19/2019   HGB 15.6 10/19/2019   HCT 46.3 10/19/2019   PLT 293 10/19/2019   GLUCOSE 150 (H) 10/19/2019   CHOL 151 10/19/2019   TRIG 131 10/19/2019   HDL 33  (L) 10/19/2019   LDLDIRECT 170.3 07/02/2006   LDLCALC 94 10/19/2019   ALT 14 10/19/2019   AST 15 10/19/2019   NA 141 10/19/2019   K 4.0 10/19/2019   CL 101 10/19/2019   CREATININE 0.84 10/19/2019   BUN 12 10/19/2019   CO2 25 10/19/2019   TSH 2.540 10/20/2016   PSA 5.00 (H) 07/02/2006   HGBA1C 6.6 (H) 10/19/2019      Assessment & Plan:  1. Mixed hyperlipidemia Well controlled.  No changes to medicines.  Continue to work on eating a healthy diet and exercise.  Labs drawn today.  -  Lipid panel - Ambulatory referral to Chronic Care Management Services  2. Essential hypertension Well controlled.  No changes to medicines.  Continue to work on eating a healthy diet and exercise.  Labs drawn today.  - Comprehensive metabolic panel - Ambulatory referral to Chronic Care Management Services  3. Combined hyperlipidemia associated with type 2 diabetes mellitus (HCC) Control: Good Recommend check feet daily. Recommend annual eye exams. Medicines: none Continue to work on eating a healthy diet and exercise.  Labs drawn today.   - CBC with Differential/Platelet - Hemoglobin A1c - POCT UA - Microalbumin - Ambulatory referral to Chronic Care Management Services  4. Other emphysema (HCC) Albuterol MDI.  5. Mild recurrent major depression ([place]) The current medical regimen is effective;  continue present plan and medications.  6. Lumbar back pain Mgmt per Dr Vertell Limber and Dr. Maryjean Ka.  7. Overweight with body mass index (BMI) of 29 to 29.9 in adult Recommend continue to work on eating healthy diet and exercise.   Follow-up: Return in about 3 months (around 01/19/2020) for fasting.  An After Visit Summary was printed and given to the patient.  Rochel Brome [facility] [phone]

## 2019-10-19 ENCOUNTER — Encounter: Payer: Self-pay | Admitting: Family Medicine

## 2019-10-19 ENCOUNTER — Ambulatory Visit (INDEPENDENT_AMBULATORY_CARE_PROVIDER_SITE_OTHER): Payer: Medicare Other | Admitting: Family Medicine

## 2019-10-19 ENCOUNTER — Other Ambulatory Visit: Payer: Self-pay

## 2019-10-19 ENCOUNTER — Telehealth: Payer: Self-pay | Admitting: Family Medicine

## 2019-10-19 VITALS — BP 124/68 | HR 72 | Temp 97.4°F | Ht 67.0 in | Wt 191.0 lb

## 2019-10-19 DIAGNOSIS — E782 Mixed hyperlipidemia: Secondary | ICD-10-CM | POA: Diagnosis not present

## 2019-10-19 DIAGNOSIS — I10 Essential (primary) hypertension: Secondary | ICD-10-CM

## 2019-10-19 DIAGNOSIS — E1169 Type 2 diabetes mellitus with other specified complication: Secondary | ICD-10-CM

## 2019-10-19 DIAGNOSIS — M545 Low back pain, unspecified: Secondary | ICD-10-CM

## 2019-10-19 DIAGNOSIS — J438 Other emphysema: Secondary | ICD-10-CM | POA: Diagnosis not present

## 2019-10-19 DIAGNOSIS — E663 Overweight: Secondary | ICD-10-CM

## 2019-10-19 DIAGNOSIS — Z6829 Body mass index (BMI) 29.0-29.9, adult: Secondary | ICD-10-CM

## 2019-10-19 DIAGNOSIS — F330 Major depressive disorder, recurrent, mild: Secondary | ICD-10-CM

## 2019-10-19 DIAGNOSIS — E119 Type 2 diabetes mellitus without complications: Secondary | ICD-10-CM | POA: Diagnosis not present

## 2019-10-19 MED ORDER — SILDENAFIL CITRATE 100 MG PO TABS: ORAL_TABLET | ORAL | 2 refills | Status: DC

## 2019-10-19 NOTE — Progress Notes (Signed)
  Chronic Care Management   Outreach Note  10/19/2019 Name: Ryze Backe MRN: [id number] DOB: 1945-08-28  Referred by: Rochel Brome, MD Reason for referral : No chief complaint on file.   An unsuccessful telephone outreach was attempted today. The patient was referred to the pharmacist for assistance with care management and care coordination.   This note is not being shared with the patient for the following reason: To respect privacy (The patient or proxy has requested that the information not be shared).  Follow Up Plan:   Earney Hamburg Upstream Scheduler

## 2019-10-20 LAB — COMPREHENSIVE METABOLIC PANEL
ALT: 14 IU/L (ref 0–44)
AST: 15 IU/L (ref 0–40)
Albumin/Globulin Ratio: 1.6 (ref 1.2–2.2)
Albumin: 4.2 g/dL (ref 3.7–4.7)
Alkaline Phosphatase: 82 IU/L (ref 48–121)
BUN/Creatinine Ratio: 14 (ref 10–24)
BUN: 12 mg/dL (ref 8–27)
Bilirubin Total: 0.5 mg/dL (ref 0.0–1.2)
CO2: 25 mmol/L (ref 20–29)
Calcium: 9.3 mg/dL (ref 8.6–10.2)
Chloride: 101 mmol/L (ref 96–106)
Creatinine, Ser: 0.84 mg/dL (ref 0.76–1.27)
GFR calc Af Amer: 98 mL/min/{1.73_m2} (ref >59)
GFR calc non Af Amer: 85 mL/min/{1.73_m2} (ref >59)
Globulin, Total: 2.7 g/dL (ref 1.5–4.5)
Glucose: 150 mg/dL — ABNORMAL HIGH (ref 65–99)
Potassium: 4 mmol/L (ref 3.5–5.2)
Sodium: 141 mmol/L (ref 134–144)
Total Protein: 6.9 g/dL (ref 6.0–8.5)

## 2019-10-20 LAB — CBC WITH DIFFERENTIAL/PLATELET
Basophils Absolute: 0.1 10*3/uL (ref 0.0–0.2)
Basos: 1 %
EOS (ABSOLUTE): 0.2 10*3/uL (ref 0.0–0.4)
Eos: 2 %
Hematocrit: 46.3 % (ref 37.5–51.0)
Hemoglobin: 15.6 g/dL (ref 13.0–17.7)
Immature Grans (Abs): 0 10*3/uL (ref 0.0–0.1)
Immature Granulocytes: 0 %
Lymphocytes Absolute: 1.5 10*3/uL (ref 0.7–3.1)
Lymphs: 18 %
MCH: 30.6 pg (ref 26.6–33.0)
MCHC: 33.7 g/dL (ref 31.5–35.7)
MCV: 91 fL (ref 79–97)
Monocytes Absolute: 0.7 10*3/uL (ref 0.1–0.9)
Monocytes: 8 %
Neutrophils Absolute: 6 10*3/uL (ref 1.4–7.0)
Neutrophils: 71 %
Platelets: 293 10*3/uL (ref 150–450)
RBC: 5.1 x10E6/uL (ref 4.14–5.80)
RDW: 12.1 % (ref 11.6–15.4)
WBC: 8.4 10*3/uL (ref 3.4–10.8)

## 2019-10-20 LAB — CARDIOVASCULAR RISK ASSESSMENT

## 2019-10-20 LAB — LIPID PANEL
Chol/HDL Ratio: 4.6 ratio (ref 0.0–5.0)
Cholesterol, Total: 151 mg/dL (ref 100–199)
HDL: 33 mg/dL — ABNORMAL LOW (ref >39)
LDL Chol Calc (NIH): 94 mg/dL (ref 0–99)
Triglycerides: 131 mg/dL (ref 0–149)
VLDL Cholesterol Cal: 24 mg/dL (ref 5–40)

## 2019-10-20 LAB — HEMOGLOBIN A1C
Est. average glucose Bld gHb Est-mCnc: 143 mg/dL
Hgb A1c MFr Bld: 6.6 % — ABNORMAL HIGH (ref 4.8–5.6)

## 2019-10-25 ENCOUNTER — Telehealth: Payer: Self-pay | Admitting: Family Medicine

## 2019-10-25 ENCOUNTER — Telehealth: Payer: Self-pay

## 2019-10-25 NOTE — Progress Notes (Signed)
°  Chronic Care Management   Note  10/25/2019 Name: Wagner Tanzi MRN: [id number] DOB: 1946-05-12  [name] is a 74 y.o. year old male who is a primary care patient of Cox, Kirsten, MD. I reached out to Effie by phone today in response to a referral sent by Mr. [name]'s PCP, CoxElnita Maxwell, MD.   Mr. Orvan Seen Sr was given information about Chronic Care Management services today including:  1. CCM service includes personalized support from designated clinical staff supervised by his physician, including individualized plan of care and coordination with other care providers 2. 24/7 contact phone numbers for assistance for urgent and routine care needs. 3. Service will only be billed when office clinical staff spend 20 minutes or more in a month to coordinate care. 4. Only one practitioner may furnish and bill the service in a calendar month. 5. The patient may stop CCM services at any time (effective at the end of the month) by phone call to the office staff.   Patient agreed to services and verbal consent obtained.   This note is not being shared with the patient for the following reason: To respect privacy (The patient or proxy has requested that the information not be shared).  Follow up plan:  Earney Hamburg Upstream Scheduler

## 2019-10-25 NOTE — Telephone Encounter (Signed)
Patient called wanted to know if it is possible to increase the strength of his Tizanidine or change the amount he is allowed to take. He reports that sometimes 1 tablet is not strong enough therefore, at times he has taken 2-3 tablets at one time (not often).

## 2019-10-25 NOTE — Telephone Encounter (Signed)
Increase tizanidine to 4 mg one three times a day as needed. Kc

## 2019-10-26 ENCOUNTER — Other Ambulatory Visit: Payer: Self-pay

## 2019-10-26 MED ORDER — TIZANIDINE HCL 4 MG PO CAPS: 4.0000 mg | ORAL_CAPSULE | Freq: Three times a day (TID) | ORAL | 0 refills | Status: DC

## 2019-10-26 NOTE — Telephone Encounter (Signed)
Patient informed of medication change, rx sent to [facility] and mail order.

## 2019-10-28 DIAGNOSIS — F330 Major depressive disorder, recurrent, mild: Secondary | ICD-10-CM | POA: Insufficient documentation

## 2019-10-28 DIAGNOSIS — M545 Low back pain, unspecified: Secondary | ICD-10-CM | POA: Insufficient documentation

## 2019-10-28 DIAGNOSIS — E663 Overweight: Secondary | ICD-10-CM | POA: Insufficient documentation

## 2019-10-28 DIAGNOSIS — J438 Other emphysema: Secondary | ICD-10-CM | POA: Insufficient documentation

## 2019-10-31 ENCOUNTER — Other Ambulatory Visit: Payer: Self-pay

## 2019-10-31 MED ORDER — TIZANIDINE HCL 4 MG PO TABS: 4.0000 mg | ORAL_TABLET | Freq: Three times a day (TID) | ORAL | 0 refills | Status: DC

## 2019-11-03 ENCOUNTER — Other Ambulatory Visit: Payer: Self-pay

## 2019-11-30 NOTE — Chronic Care Management (AMB) (Signed)
Chronic Care Management Pharmacy  Name: Wayne [name]  MRN: [id number] DOB: [birth year]  Chief Complaint/ HPI  Wayne [name],  74 y.o. , male presents for their Initial CCM visit with the clinical pharmacist via telephone due to COVID-19 Pandemic.  PCP : Rochel Brome, MD  Their chronic conditions include: hypertension, emphysema, diabetes, memory loss, vertigo, hyperlipidemia, depression, lumbar back pain.   Office Visits: 10/19/2019 - recommend healthy diet and exercise. No medication changes.  Consult Visit: n/a Medications: Outpatient Encounter Medications as of 12/05/2019  Medication Sig  . amLODipine (NORVASC) 5 MG tablet TAKE 1 TABLET BY MOUTH  DAILY  . aspirin 81 MG chewable tablet Chew 81 mg by mouth daily.  . Aspirin-Salicylamide-Caffeine (BC HEADACHE POWDER PO) Take 1 packet by mouth in the morning and at bedtime.  Marland Kitchen atorvastatin (LIPITOR) 10 MG tablet TAKE 1 TABLET BY MOUTH ONCE DAILY  . DULoxetine (CYMBALTA) 60 MG capsule TAKE 1 CAPSULE BY MOUTH  ONCE DAILY  . finasteride (PROSCAR) 5 MG tablet Take 5 mg by mouth at bedtime.   . fluticasone (FLONASE) 50 MCG/ACT nasal spray Place 2 sprays into both nostrils daily as needed for allergies or rhinitis.  Marland Kitchen ipratropium-albuterol (DUONEB) 0.5-2.5 (3) MG/3ML SOLN Take 3 mLs by nebulization every 6 (six) hours as needed.  Marland Kitchen losartan (COZAAR) 100 MG tablet TAKE 1 TABLET BY MOUTH  DAILY  . Melatonin 10 MG TABS Take 5 mg by mouth at bedtime.  Marland Kitchen omeprazole (PRILOSEC) 40 MG capsule TAKE 1 CAPSULE BY MOUTH  DAILY  . sildenafil (VIAGRA) 100 MG tablet TAKE 1 TABLET BY MOUTH AS NEEDED APPROXIMATELY ONE HOUR BEFORE SEXUAL ACTIVITY  . tamsulosin (FLOMAX) 0.4 MG CAPS capsule TAKE 1 CAPSULE BY MOUTH  DAILY  . tiZANidine (ZANAFLEX) 4 MG tablet Take 1 tablet (4 mg total) by mouth 3 (three) times daily. (Patient taking differently: Take 2 mg by mouth. Daily at bedtime and as needed during the day for back pain.)  . traZODone (DESYREL)  50 MG tablet TAKE 2 AND 1/2 TABLETS BY  MOUTH 1 TIME AT NIGHT (Patient taking differently: Take 50 mg by mouth at bedtime. )  . acetaminophen (TYLENOL) 500 MG tablet Take 1 tablet (500 mg total) by mouth every 6 (six) hours as needed. (Patient not taking: Reported on 12/05/2019)  . albuterol (PROVENTIL) (2.5 MG/3ML) 0.083% nebulizer solution Inhale into the lungs. (Patient not taking: Reported on 12/05/2019)  . metoprolol succinate (TOPROL-XL) 25 MG 24 hr tablet Take 25 mg by mouth daily. (Patient not taking: Reported on 12/05/2019)   No facility-administered encounter medications on file as of 12/05/2019.   Allergies  Allergen Reactions  . Gabapentin Other (See Comments)    Adverse reaction  . Sertraline Other (See Comments)    Adverse reaction  . Metformin Diarrhea and Nausea Only  . Pineapple Hives  . Pravastatin Other (See Comments)  . Lorazepam Other (See Comments)    Shakiness   SDOH Screenings   Alcohol Screen:   . Last Alcohol Screening Score (AUDIT):   Depression (PHQ2-9): Low Risk   . PHQ-2 Score: 0  Financial Resource Strain:   . Difficulty of Paying Living Expenses:   Food Insecurity:   . Worried About Charity fundraiser in the Last Year:   . [place] in the Last Year:   Housing:   . Last Housing Risk Score:   Physical Activity:   . Days of Exercise per Week:   . Minutes of  Exercise per Session:   Social Connections:   . Frequency of Communication with Friends and Family:   . Frequency of Social Gatherings with Friends and Family:   . Attends Religious Services:   . Active Member of Clubs or Organizations:   . Attends Archivist Meetings:   Marland Kitchen Marital Status:   Stress:   . Feeling of Stress :   Tobacco Use: High Risk  . Smoking Tobacco Use: Current Every Day Smoker  . Smokeless Tobacco Use: Never Used  Transportation Needs:   . Film/video editor (Medical):   Marland Kitchen Lack of Transportation (Non-Medical):      Current  Diagnosis/Assessment:  Goals Addressed            This Visit's Progress   . Pharmacy Care Plan       CARE PLAN ENTRY (see longitudinal plan of care for additional care plan information)  Current Barriers:  . Chronic Disease Management support, education, and care coordination needs related to Hypertension, Hyperlipidemia, Diabetes, and COPD   Hypertension BP Readings from Last 3 Encounters:  10/19/19 124/68  05/13/17 (!) 161/96  10/20/16 (!) 175/92   . Pharmacist Clinical Goal(s): o Over the next 90 days, patient will work with PharmD and providers to maintain BP goal <130/80 . Current regimen:  o Amlodipine 5 mg daily o Losartan 100 mg daily . Interventions: o Recommend patient begin checking bp and bring to this week's appointment.  o Discussed patient will see provider this week due to chest/arm pain.  . Patient self care activities - Over the next 90 days, patient will: o Check BP 2-3 times weekly, document, and provide at future appointments o Ensure daily salt intake < 2300 mg/day  Hyperlipidemia Lab Results  Component Value Date/Time   LDLCALC 94 10/19/2019 11:48 AM   LDLDIRECT 170.3 07/02/2006 08:07 AM   . Pharmacist Clinical Goal(s): o Over the next 90 days, patient will work with PharmD and providers to achieve LDL goal < 70 . Current regimen:  o Atorvastatin 10 mg daily o Asprin 81 mg daily . Interventions: o Recommend increase to atorvastatin 40 mg daily due to elevated ASCVD risk.  o Discussed risks of BC powder twice daily for blood thinning.  o Discussed smoking cessation and patient's willingness to quit. Patient is interested if nicotine patches are available on insurance.  . Patient self care activities - Over the next 90 days, patient will: o Continue taking medication as prescribed and reaching out to provider with questions/concerns.   Diabetes Lab Results  Component Value Date/Time   HGBA1C 6.6 (H) 10/19/2019 11:48 AM   . Pharmacist  Clinical Goal(s): o Over the next 90 days, patient will work with PharmD and providers to maintain A1c goal <7% . Current regimen:  o Diet and Exercise . Interventions: o Recommend continuing to stay active in yard and garden each day.  o Recommend continuing to eat healthy diet to maintain good control.  o Discussed risks associated with smoking and options for smoking cessation.  . Patient self care activities - Over the next 90 days, patient will: o Continue healthy diet and active lifestyle.  o Contact provider with any episodes of hypoglycemia  Emphysema . Pharmacist Clinical Goal(s) o Over the next 90 days, patient will work with PharmD and providers to obtain maintenance inhaler   . Current regimen:  o Duoneb nebulizer solution prn . Interventions: o Recommend patient use a maintenance inhaler daily as preventative.  o Discussed picking up samples  and providing information and signature for patient assistance.  o Pharmacist determining cost of albuterol inhaler and coordinating filling with patient if cost effective.  . Patient self care activities - Over the next 90 days, patient will: o Patient will pick-up samples of Breztri until patient assistance approved.   Medication management . Pharmacist Clinical Goal(s): o Over the next 90 days, patient will work with PharmD and providers to maintain optimal medication adherence . Current pharmacy: [facility]  . Interventions o Comprehensive medication review performed. o Continue current medication management strategy . Patient self care activities - Over the next 90 days, patient will: o Focus on medication adherence by using pill box.  o Take medications as prescribed o Report any questions or concerns to PharmD and/or provider(s)  Initial goal documentation        COPD / Asthma / Tobacco   Eosinophil count:   Lab Results  Component Value Date/Time   EOSPCT 2 05/13/2017 03:52 PM  %                                Eos (Absolute):  Lab Results  Component Value Date/Time   EOSABS 0.2 10/19/2019 11:48 AM    Tobacco Status:  Social History   Tobacco Use  Smoking Status Current Every Day Smoker  . Packs/day: 2.00  . Types: Cigarettes  Smokeless Tobacco Never Used    Patient has failed these meds in past: Trelegy Patient is currently uncontrolled on the following medications:   fluticasone nasal spray 2 sprays into both nostrils daily as needed for allergies or rhinitis  Duoneb nebulizer solution prn  Using maintenance inhaler regularly? No Frequency of rescue inhaler use:  never  We discussed:  proper inhaler technique Has a pulseoximeter at home and has been as low as 83% after walking outdoors or doing outside chores. Comes back up if he comes in to rest. His normal O2 saturation level 97%. Patient is not using a maintenance or rescue inhaler due to cost. Would like to pursue patient assistance to make it more affordable.   Plan  Pharmacist working with patient to improve affordability/accessability of maintenance of inhaler through patient assistance. Pharmacist determined generic albuterol is affordable through mail-order.  ,   Tobacco Abuse   Tobacco Status:  Social History   Tobacco Use  Smoking Status Current Every Day Smoker  . Packs/day: 2.00  . Types: Cigarettes  Smokeless Tobacco Never Used    Patient has failed these meds in past: n/a Patient is currently uncontrolled on the following medications:  . n/a  We discussed:  Provided contact information for [place] [facility]uit Line (1-800-QUIT-NOW) and encouraged patient to reach out to this group for support.  Plan  Recommend patient call [place] Quit Line for smoking cessation support.    Diabetes   Recent Relevant Labs: Lab Results  Component Value Date/Time   HGBA1C 6.6 (H) 10/19/2019 11:48 AM     Checking BG: Never  Patient has failed these meds in past: metformin Patient is currently controlled on the  following medications: diet and lifestyle  Last diabetic Foot exam: 09/18/2019 Last diabetic Eye exam: uncertain - record not available in chart  We discussed: diet and exercise extensively. Stays active working in his yard as tolerated. He likes meat and potatoes. He eats vegetables like squash, beans, salads, cucumber, tomatoes, corn on the cob, etc. He tends to a garden and checks it early  am or late evening. He used to walk 2 miles a day but can't do that now due to balance and breathing.   Plan  Continue current medications and control with diet and exercise    Hypertension   BP today is:  Unsure - not checking  Office blood pressures are  BP Readings from Last 3 Encounters:  12/07/19 132/74  10/19/19 124/68  05/13/17 (!) 161/96   Kidney Function Lab Results  Component Value Date/Time   CREATININE 0.84 10/19/2019 11:48 AM   CREATININE 0.80 05/13/2017 03:52 PM   GFRNONAA 85 10/19/2019 11:48 AM   GFRAA 98 10/19/2019 11:48 AM   K 4.0 10/19/2019 11:48 AM   K 3.5 05/13/2017 03:52 PM     Patient has failed these meds in the past: metoprolol succinate Patient is currently controlled on the following medications:   Amlodipine 5 mg daily  Losartan 100 mg daily  Patient checks BP at home infrequently  Patient home BP readings are ranging: n/a  We discussed diet and exercise extensively. Has a lot of dizziness that he attributes to blood pressure medications. Gets dizzy walking and moving around. Notes that he has some swelling in feet while out working in the heat.  Each day around 4 pm gets dizzy or begins staggering. Uses a cane to balance himself when he has spells of dizziness. Has had dizziness for 5 years. Recommended patient begin checking bp and bringing to his visits.   Plan  Continue current medications     Hyperlipidemia    Lipid Panel     Component Value Date/Time   CHOL 151 10/19/2019 1148   TRIG 131 10/19/2019 1148   HDL 33 (L) 10/19/2019 1148    LDLCALC 94 10/19/2019 1148   LDLDIRECT 170.3 07/02/2006 0807    Hepatic Function Latest Ref Rng & Units 10/19/2019 07/02/2006  Total Protein 6.0 - 8.5 g/dL 6.9 6.7  Albumin 3.7 - 4.7 g/dL 4.2 3.8  AST 0 - 40 IU/L 15 26  ALT 0 - 44 IU/L 14 36  Alk Phosphatase 48 - 121 IU/L 82 70  Total Bilirubin 0.0 - 1.2 mg/dL 0.5 0.8  Bilirubin, Direct 0.0 - 0.3 mg/dL - 0.2     The 10-year ASCVD risk score Mikey Bussing DC Jr., et al., 2013) is: 55.7%   Values used to calculate the score:     Age: 1 years     Sex: Male     Is Non-Hispanic African American: No     Diabetic: Yes     Tobacco smoker: Yes     Systolic Blood Pressure: 301 mmHg     Is BP treated: Yes     HDL Cholesterol: 33 mg/dL     Total Cholesterol: 151 mg/dL   Patient has failed these meds in past: pravastatin Patient is currently uncontrolled on the following medications:  . aspirin 81 mg daily . Atorvastatin 10 mg daily  We discussed:  diet and exercise extensively. Patient is a candidate for a higher intensity statin regimen due to ASCVD risk. Patient and his wife are very interested in anything preventatively that they could do. Especially with his aneurysm.   Plan  Recommend considering an increase in atorvastatin to 40 mg daily.   Lumbar Back Pain   Patient has failed these meds in past: naproxen, acetaminophen Patient is currently uncontrolled on the following medications:  . BC powder twice daily . Tizanidine 2 mg at bedtime  We discussed:   Patient has been recently increased to  4 mg of tizanidine. He is taking the 2 mg dose now due to the 4 mg makes him sleepy and more unsteady on his feet. Has been to pain management in [place] and had shots in his back. He had a referral for a surgeon but he does not want to go through back surgery. He is working to cut back on tizanidine.   Patient is resistant to considering physical therapy at this time but is having significant unsteadiness on feet.   Plan  Continue current  medications   GERD   Patient has failed these meds in past: n/a Patient is currently controlled on the following medications:  . Omeprazole 40 mg daily   We discussed:  Patient had significant acid reflux symptoms in the past. States good control with omeprazole.   Plan  Continue current medications   BPH   Patient has failed these meds in past: n/a Patient is currently controlled on the following medications:  . Finasteride 5 mg daily at bedtime . Tamsulosin 0.4 mg daily  We discussed:   Patient reports that urinary symptoms are controlled. He has seen Dr. Nila Nephew previously.   Plan  Continue current medications   Neuropathy   Patient has failed these meds in past: lorazepam, nortriptyline Patient is currently controlled on the following medications:  . Duloxetine 60 mg daily  We discussed:  At times feet are cold and stinging. His feet do swell some in the summer. He loses his balance easily when bending over. Patient does not see the benefit in physical therapy.   Plan  Continue current medications   Insomnia   Patient has failed these meds in past: n/a Patient is currently controlled on the following medications:   Trazodone 50 mg at night  Melatonin 5 mg qhs  We discussed:  Has always been a light sleeper and had problems sleeping. He averages about 6 hours of sleep each night. Gets up to go to the restroom. He has to be cautious of getting up during the night due to instability.   Plan  Continue current medications   Health Maintenance   Patient is currently controlled on the following medications:  . Sildenafil 100 mg as needed approximately one hour before sexual activity - male erectile disorder  We discussed:  Patient does not think sildenafil is effectively managing symptoms of erectile disorder. He would like to consider another option if affordable. Pharmacist will discuss option of Cialis daily with provider and consider patient assistance if  approved.   Plan  Continue current medications  Vaccines   Reviewed and discussed patient's vaccination history.   Patient received Prevnar 13 and Pneumovax 23 in office. No record of Tetanus.   Immunization History  Administered Date(s) Administered  . Influenza-Unspecified 03/18/2018    Plan  Recommended patient receive annual flu vaccine in office.   Medication Management   [facility] medications Uses pill box? Yes Pt endorses good compliance  We discussed: Wife fills pill box each week. Patient uses mail order which is the most cost effective option.   Plan  Continue current medication management strategy    Follow up: 3 month phone visit

## 2019-12-05 ENCOUNTER — Ambulatory Visit: Payer: Medicare Other

## 2019-12-05 DIAGNOSIS — J438 Other emphysema: Secondary | ICD-10-CM

## 2019-12-05 DIAGNOSIS — E119 Type 2 diabetes mellitus without complications: Secondary | ICD-10-CM

## 2019-12-05 DIAGNOSIS — E782 Mixed hyperlipidemia: Secondary | ICD-10-CM

## 2019-12-05 NOTE — Patient Instructions (Signed)
Visit Information  Thank you for your time discussing your medications. I look forward to working with you to achieve your health care goals. Below is a summary of what we talked about during our visit.   Goals Addressed   None     Wayne [name] was given information about Chronic Care Management services today including:  1. CCM service includes personalized support from designated clinical staff supervised by his physician, including individualized plan of care and coordination with other care providers 2. 24/7 contact phone numbers for assistance for urgent and routine care needs. 3. Standard insurance, coinsurance, copays and deductibles apply for chronic care management only during months in which we provide at least 20 minutes of these services. Most insurances cover these services at 100%, however patients may be responsible for any copay, coinsurance and/or deductible if applicable. This service may help you avoid the need for more expensive face-to-face services. 4. Only one practitioner may furnish and bill the service in a calendar month. 5. The patient may stop CCM services at any time (effective at the end of the month) by phone call to the office staff.  Patient agreed to services and verbal consent obtained.   The patient verbalized understanding of instructions provided today and agreed to receive a mailed copy of patient instruction and/or educational materials. Telephone follow up appointment with pharmacy team member scheduled for: 02/2020  Sherre Poot, PharmD Clinical Pharmacist [facility] [phone] (office) [phone] (mobile)  Coping with Quitting Smoking  Quitting smoking is a physical and mental challenge. You will face cravings, withdrawal symptoms, and temptation. Before quitting, work with your health care provider to make a plan that can help you cope. Preparation can help you quit and keep you from giving in. How can I cope with  cravings? Cravings usually last for 5-10 minutes. If you get through it, the craving will pass. Consider taking the following actions to help you cope with cravings:  Keep your mouth busy: ? Chew sugar-free gum. ? Suck on hard candies or a straw. ? Brush your teeth.  Keep your hands and body busy: ? Immediately change to a different activity when you feel a craving. ? Squeeze or play with a ball. ? Do an activity or a hobby, like making bead jewelry, practicing needlepoint, or working with wood. ? Mix up your normal routine. ? Take a short exercise break. Go for a quick walk or run up and down stairs. ? Spend time in public places where smoking is not allowed.  Focus on doing something kind or helpful for someone else.  Call a friend or family member to talk during a craving.  Join a support group.  Call a quit line, such as 1-800-QUIT-NOW.  Talk with your health care provider about medicines that might help you cope with cravings and make quitting easier for you. How can I deal with withdrawal symptoms? Your body may experience negative effects as it tries to get used to not having nicotine in the system. These effects are called withdrawal symptoms. They may include:  Feeling hungrier than normal.  Trouble concentrating.  Irritability.  Trouble sleeping.  Feeling depressed.  Restlessness and agitation.  Craving a cigarette. To manage withdrawal symptoms:  Avoid places, people, and activities that trigger your cravings.  Remember why you want to quit.  Get plenty of sleep.  Avoid coffee and other caffeinated drinks. These may worsen some of your symptoms. How can I handle social situations? Social situations can  be difficult when you are quitting smoking, especially in the first few weeks. To manage this, you can:  Avoid parties, bars, and other social situations where people might be smoking.  Avoid alcohol.  Leave right away if you have the urge to  smoke.  Explain to your family and friends that you are quitting smoking. Ask for understanding and support.  Plan activities with friends or family where smoking is not an option. What are some ways I can cope with stress? Wanting to smoke may cause stress, and stress can make you want to smoke. Find ways to manage your stress. Relaxation techniques can help. For example:  Breathe slowly and deeply, in through your nose and out through your mouth.  Listen to soothing, relaxing music.  Talk with a family member or friend about your stress.  Light a candle.  Soak in a bath or take a shower.  Think about a peaceful place. What are some ways I can prevent weight gain? Be aware that many people gain weight after they quit smoking. However, not everyone does. To keep from gaining weight, have a plan in place before you quit and stick to the plan after you quit. Your plan should include:  Having healthy snacks. When you have a craving, it may help to: ? Eat plain popcorn, crunchy carrots, celery, or other cut vegetables. ? Chew sugar-free gum.  Changing how you eat: ? Eat small portion sizes at meals. ? Eat 4-6 small meals throughout the day instead of 1-2 large meals a day. ? Be mindful when you eat. Do not watch television or do other things that might distract you as you eat.  Exercising regularly: ? Make time to exercise each day. If you do not have time for a long workout, do short bouts of exercise for 5-10 minutes several times a day. ? Do some form of strengthening exercise, like weight lifting, and some form of aerobic exercise, like running or swimming.  Drinking plenty of water or other low-calorie or no-calorie drinks. Drink 6-8 glasses of water daily, or as much as instructed by your health care provider. Summary  Quitting smoking is a physical and mental challenge. You will face cravings, withdrawal symptoms, and temptation to smoke again. Preparation can help you as you  go through these challenges.  You can cope with cravings by keeping your mouth busy (such as by chewing gum), keeping your body and hands busy, and making calls to family, friends, or a helpline for people who want to quit smoking.  You can cope with withdrawal symptoms by avoiding places where people smoke, avoiding drinks with caffeine, and getting plenty of rest.  Ask your health care provider about the different ways to prevent weight gain, avoid stress, and handle social situations. This information is not intended to replace advice given to you by your health care provider. Make sure you discuss any questions you have with your health care provider. Document Revised: 04/16/2017 Document Reviewed: 05/01/2016 Elsevier Patient Education  2020 [employer].

## 2019-12-06 ENCOUNTER — Other Ambulatory Visit: Payer: Self-pay

## 2019-12-06 MED ORDER — ALBUTEROL SULFATE HFA 108 (90 BASE) MCG/ACT IN AERS: 2.0000 | INHALATION_SPRAY | Freq: Four times a day (QID) | RESPIRATORY_TRACT | 2 refills | Status: DC | PRN

## 2019-12-06 NOTE — Telephone Encounter (Signed)
Per Emeterio Reeve- "This is also one of Dr. [facility]'s patients that has COPD but no rescue inhaler due to cost. I found their formulary and at [place] the Albuterol 8.5 gram size would be free. Would it be possible to build the prescription for Gay Filler or Dr. Henrene Pastor to submit to [facility]?"

## 2019-12-07 ENCOUNTER — Encounter: Payer: Self-pay | Admitting: Family Medicine

## 2019-12-07 ENCOUNTER — Other Ambulatory Visit: Payer: Self-pay

## 2019-12-07 ENCOUNTER — Ambulatory Visit (INDEPENDENT_AMBULATORY_CARE_PROVIDER_SITE_OTHER): Payer: Medicare Other | Admitting: Family Medicine

## 2019-12-07 VITALS — BP 132/74 | HR 87 | Temp 97.5°F | Resp 18 | Ht 67.0 in | Wt 190.4 lb

## 2019-12-07 DIAGNOSIS — Z716 Tobacco abuse counseling: Secondary | ICD-10-CM

## 2019-12-07 DIAGNOSIS — I712 Thoracic aortic aneurysm, without rupture, unspecified: Secondary | ICD-10-CM | POA: Insufficient documentation

## 2019-12-07 DIAGNOSIS — M546 Pain in thoracic spine: Secondary | ICD-10-CM | POA: Diagnosis not present

## 2019-12-07 DIAGNOSIS — E041 Nontoxic single thyroid nodule: Secondary | ICD-10-CM | POA: Diagnosis not present

## 2019-12-07 MED ORDER — PREDNISONE 20 MG PO TABS: 20.0000 mg | ORAL_TABLET | Freq: Two times a day (BID) | ORAL | 0 refills | Status: DC

## 2019-12-07 MED ORDER — BUPROPION HCL ER (SR) 150 MG PO TB12: 150.0000 mg | ORAL_TABLET | Freq: Two times a day (BID) | ORAL | 1 refills | Status: DC

## 2019-12-07 NOTE — Patient Instructions (Signed)
Take prednisone with FOOD.  DO NOT take any other NSAIDS (BC powder, Ibuprofen, Aleve) while taking prednisone. Take zanaflex with FOOD.

## 2019-12-07 NOTE — Progress Notes (Signed)
Acute Office Visit  Subjective:    Patient ID: Wayne [name], male    DOB: Jul 11, 1945, 74 y.o.   MRN: [id number]  Chief Complaint  Patient presents with  . pain left axilla  . left sided chest tenderness    HPI Patient is in today for left posterior shoulder pain that radiates over and under his shoulder to his lateral side. Painful to sleep and touch. Ongoing over the past several weeks. No lesions  Smoking cessation-has tried nicotine patches in the past which helped some. States he is willing to try wellbutrin and is aware that he can take medication in conjunction with nicotine patches.pt did not like chantix  HTN-pt has not taken medication prior to taking morning bp at home 153/90 but had smoked Previous bp 171/94-4pm pulse 67, 175/87 pulse 67, bp 148/89 pulse 59 Amlodipine/losartan daily-no longer taking metoprolol   Aneurysm noted on CT last year-needs repeat evaluation Past Medical History:  Diagnosis Date  . BPH (benign prostatic hyperplasia)   . COPD (chronic obstructive pulmonary disease) ([place])   . Diabetes mellitus without complication ([place])   . GERD (gastroesophageal reflux disease)   . Hypertension   . Male erectile disorder   . Meniere's disease   . Mixed hyperlipidemia   . Nontoxic single thyroid nodule   . Somnolence   . Thoracic aortic aneurysm [facility])     Past Surgical History:  Procedure Laterality Date  . CATARACT EXTRACTION Bilateral   . ELBOW SURGERY Right     Family History  Problem Relation Age of Onset  . Heart attack Mother   . Diabetes Father   . COPD Father   . Diverticulitis Brother     Social History   Socioeconomic History  . Marital status: Married    Spouse name: Not on file  . Number of children: 3  . Years of education: Not on file  . Highest education level: Not on file  Occupational History  . Not on file  Tobacco Use  . Smoking status: Current Every Day Smoker    Packs/day: 2.00    Types: Cigarettes  .  Smokeless tobacco: Never Used  Substance and Sexual Activity  . Alcohol use: Yes    Comment: occasional  . Drug use: No  . Sexual activity: Not on file  Other Topics Concern  . Not on file  Social History Narrative  . Not on file   Social Determinants of Health   Financial Resource Strain:   . Difficulty of Paying Living Expenses:   Food Insecurity:   . Worried About Charity fundraiser in the Last Year:   . Arboriculturist in the Last Year:   Transportation Needs:   . Film/video editor (Medical):   Marland Kitchen Lack of Transportation (Non-Medical):   Physical Activity:   . Days of Exercise per Week:   . Minutes of Exercise per Session:   Stress:   . Feeling of Stress :   Social Connections:   . Frequency of Communication with Friends and Family:   . Frequency of Social Gatherings with Friends and Family:   . Attends Religious Services:   . Active Member of Clubs or Organizations:   . Attends Archivist Meetings:   Marland Kitchen Marital Status:   Intimate Partner Violence:   . Fear of Current or Ex-Partner:   . Emotionally Abused:   Marland Kitchen Physically Abused:   . Sexually Abused:     Outpatient Medications Prior to  Visit  Medication Sig Dispense Refill  . acetaminophen (TYLENOL) 500 MG tablet Take 1 tablet (500 mg total) by mouth every 6 (six) hours as needed. (Patient not taking: Reported on 12/05/2019) 30 tablet 0  . albuterol (PROVENTIL) (2.5 MG/3ML) 0.083% nebulizer solution Inhale into the lungs. (Patient not taking: Reported on 12/05/2019)    . albuterol (VENTOLIN HFA) 108 (90 Base) MCG/ACT inhaler Inhale 2 puffs into the lungs every 6 (six) hours as needed for wheezing or shortness of breath. 8.5 g 2  . amLODipine (NORVASC) 5 MG tablet TAKE 1 TABLET BY MOUTH  DAILY 90 tablet 0  . aspirin 81 MG chewable tablet Chew 81 mg by mouth daily.    . Aspirin-Salicylamide-Caffeine (BC HEADACHE POWDER PO) Take 1 packet by mouth in the morning and at bedtime.    Marland Kitchen atorvastatin (LIPITOR) 10  MG tablet TAKE 1 TABLET BY MOUTH ONCE DAILY 90 tablet 3  . DULoxetine (CYMBALTA) 60 MG capsule TAKE 1 CAPSULE BY MOUTH  ONCE DAILY 90 capsule 3  . finasteride (PROSCAR) 5 MG tablet Take 5 mg by mouth at bedtime.     . fluticasone (FLONASE) 50 MCG/ACT nasal spray Place 2 sprays into both nostrils daily as needed for allergies or rhinitis. 1 g 1  . ipratropium-albuterol (DUONEB) 0.5-2.5 (3) MG/3ML SOLN Take 3 mLs by nebulization every 6 (six) hours as needed.    Marland Kitchen losartan (COZAAR) 100 MG tablet TAKE 1 TABLET BY MOUTH  DAILY 90 tablet 3  . Melatonin 10 MG TABS Take 5 mg by mouth at bedtime.    . metoprolol succinate (TOPROL-XL) 25 MG 24 hr tablet Take 25 mg by mouth daily. (Patient not taking: Reported on 12/05/2019)    . omeprazole (PRILOSEC) 40 MG capsule TAKE 1 CAPSULE BY MOUTH  DAILY 90 capsule 3  . sildenafil (VIAGRA) 100 MG tablet TAKE 1 TABLET BY MOUTH AS NEEDED APPROXIMATELY ONE HOUR BEFORE SEXUAL ACTIVITY 4 tablet 2  . tamsulosin (FLOMAX) 0.4 MG CAPS capsule TAKE 1 CAPSULE BY MOUTH  DAILY 90 capsule 3  . tiZANidine (ZANAFLEX) 4 MG tablet Take 1 tablet (4 mg total) by mouth 3 (three) times daily. (Patient taking differently: Take 2 mg by mouth. Daily at bedtime and as needed during the day for back pain.) 90 tablet 0  . traZODone (DESYREL) 50 MG tablet TAKE 2 AND 1/2 TABLETS BY  MOUTH 1 TIME AT NIGHT (Patient taking differently: Take 50 mg by mouth at bedtime. ) 225 tablet 3   No facility-administered medications prior to visit.    Allergies  Allergen Reactions  . Gabapentin Other (See Comments)    Adverse reaction  . Sertraline Other (See Comments)    Adverse reaction  . Metformin Diarrhea and Nausea Only  . Pineapple Hives  . Pravastatin Other (See Comments)  . Lorazepam Other (See Comments)    Shakiness    Review of Systems  Respiratory:       Chest wall pain under left arm to palpation  Musculoskeletal:       Pain to palpation inferior to scapula-FROM       Objective:      Physical Exam Vitals reviewed.  Constitutional:      Appearance: Normal appearance. He is normal weight.  Neck:      Comments: tendernss right trapezius Cardiovascular:     Rate and Rhythm: Normal rate and regular rhythm.     Pulses: Normal pulses.     Heart sounds: Normal heart sounds.  Pulmonary:  Effort: Pulmonary effort is normal.     Breath sounds: Normal breath sounds.  Abdominal:     General: Abdomen is flat. Bowel sounds are normal.     Palpations: Abdomen is soft.  Musculoskeletal:     Left shoulder: Tenderness present.     Left elbow: Normal range of motion. No tenderness.       Arms:     Cervical back: Pain with movement (right lateral side) present. Decreased range of motion.     Comments: Lt elbow pain 5/10  Neurological:     Mental Status: He is alert and oriented to person, place, and time.  Psychiatric:        Mood and Affect: Mood normal.        Behavior: Behavior normal.     BP 132/74   Pulse 87   Temp (!) 97.5 F (36.4 C)   Resp 18   Ht 5\' 7"  (1.702 m)   Wt 190 lb 6.4 oz (86.4 kg)   BMI 29.82 kg/m  Wt Readings from Last 3 Encounters:  12/07/19 190 lb 6.4 oz (86.4 kg)  10/19/19 191 lb (86.6 kg)  05/13/17 193 lb (87.5 kg)    Health Maintenance Due  Topic Date Due  . Hepatitis C Screening  Never done  . FOOT EXAM  Never done  . OPHTHALMOLOGY EXAM  Never done  . COVID-19 Vaccine (1) Never done  . TETANUS/TDAP  Never done  . PNA vac Low Risk Adult (1 of 2 - PCV13) Never done    There are no preventive care reminders to display for this patient.   Lab Results  Component Value Date   TSH 2.540 10/20/2016   Lab Results  Component Value Date   WBC 8.4 10/19/2019   HGB 15.6 10/19/2019   HCT 46.3 10/19/2019   MCV 91 10/19/2019   PLT 293 10/19/2019   Lab Results  Component Value Date   NA 141 10/19/2019   K 4.0 10/19/2019   CO2 25 10/19/2019   GLUCOSE 150 (H) 10/19/2019   BUN 12 10/19/2019   CREATININE 0.84 10/19/2019    BILITOT 0.5 10/19/2019   ALKPHOS 82 10/19/2019   AST 15 10/19/2019   ALT 14 10/19/2019   PROT 6.9 10/19/2019   ALBUMIN 4.2 10/19/2019   CALCIUM 9.3 10/19/2019   ANIONGAP 11 05/13/2017   Lab Results  Component Value Date   CHOL 151 10/19/2019   Lab Results  Component Value Date   HDL 33 (L) 10/19/2019   Lab Results  Component Value Date   LDLCALC 94 10/19/2019   Lab Results  Component Value Date   TRIG 131 10/19/2019   Lab Results  Component Value Date   CHOLHDL 4.6 10/19/2019   Lab Results  Component Value Date   HGBA1C 6.6 (H) 10/19/2019       Assessment & Plan:   1. Encounter for smoking cessation counseling D/w pt smoking cessation-pt agreed a trial of wellbutrin-risk/benefit/side effects d/w pt pt - buPROPion (WELLBUTRIN SR) 150 MG 12 hr tablet; Take 1 tablet (150 mg total) by mouth 2 (two) times daily.  Dispense: 60 tablet; Refill:  2. Acute thoracic back pain, unspecified back pain laterality D/w pt concern for C7/T1-2 nerve distribution-pt with no injury noted-h/o of lumbar disc problems with injections-reviewed CT angio from last year -pt can not tolerate an MRI - predniSONE (DELTASONE) 20 MG tablet; Take 1 tablet (20 mg total) by mouth 2 (two) times daily with a meal.  Dispense: 10  tablet; Refill: 0  3. Thoracic aortic aneurysm without rupture ([place]) Repeat recommended with aneurysm - CT Angio Chest W/Cm &/Or Wo Cm; Future  4. Thyroid nodule Noted on old CT angio-ultrasound completed-no concern-no repeat advised  Follow-up:  An After Visit Summary was printed and given to the patient.  Portal [phone]

## 2019-12-15 ENCOUNTER — Other Ambulatory Visit: Payer: Self-pay

## 2019-12-15 DIAGNOSIS — I10 Essential (primary) hypertension: Secondary | ICD-10-CM

## 2019-12-15 DIAGNOSIS — E782 Mixed hyperlipidemia: Secondary | ICD-10-CM

## 2019-12-15 DIAGNOSIS — E119 Type 2 diabetes mellitus without complications: Secondary | ICD-10-CM

## 2019-12-15 NOTE — Chronic Care Management (AMB) (Signed)
Contacted patient to let him know that Judithann Sauger is approved through Patient assistance through 05/17/2020. His product will be shipped to his home through [facility] in the next 7-10 business days.   Sherre Poot, PharmD, [facility] Clinical Pharmacist [facility] [phone] (office) [phone] (mobile)

## 2019-12-15 NOTE — Progress Notes (Signed)
Patient had an appt with Sherre Poot, PharmD on 12/05/2019.

## 2019-12-17 ENCOUNTER — Other Ambulatory Visit: Payer: Self-pay | Admitting: Family Medicine

## 2019-12-17 MED ORDER — TADALAFIL 5 MG PO TABS: ORAL_TABLET | ORAL | 5 refills | Status: DC

## 2020-01-11 ENCOUNTER — Other Ambulatory Visit: Payer: Self-pay | Admitting: Family Medicine

## 2020-01-11 ENCOUNTER — Other Ambulatory Visit: Payer: Self-pay

## 2020-01-12 MED ORDER — FLUTICASONE PROPIONATE 50 MCG/ACT NA SUSP: 2.0000 | Freq: Every day | NASAL | 1 refills | Status: DC | PRN

## 2020-01-14 ENCOUNTER — Other Ambulatory Visit: Payer: Self-pay | Admitting: Physician Assistant

## 2020-01-25 ENCOUNTER — Other Ambulatory Visit: Payer: Self-pay

## 2020-01-25 ENCOUNTER — Encounter: Payer: Self-pay | Admitting: Family Medicine

## 2020-01-25 ENCOUNTER — Ambulatory Visit (INDEPENDENT_AMBULATORY_CARE_PROVIDER_SITE_OTHER): Payer: Medicare Other | Admitting: Family Medicine

## 2020-01-25 VITALS — BP 132/64 | HR 78 | Temp 97.2°F | Resp 18 | Ht 67.0 in | Wt 194.6 lb

## 2020-01-25 DIAGNOSIS — I10 Essential (primary) hypertension: Secondary | ICD-10-CM

## 2020-01-25 DIAGNOSIS — E1169 Type 2 diabetes mellitus with other specified complication: Secondary | ICD-10-CM

## 2020-01-25 DIAGNOSIS — M25461 Effusion, right knee: Secondary | ICD-10-CM

## 2020-01-25 DIAGNOSIS — Z23 Encounter for immunization: Secondary | ICD-10-CM | POA: Diagnosis not present

## 2020-01-25 DIAGNOSIS — Z9189 Other specified personal risk factors, not elsewhere classified: Secondary | ICD-10-CM

## 2020-01-25 DIAGNOSIS — I712 Thoracic aortic aneurysm, without rupture, unspecified: Secondary | ICD-10-CM

## 2020-01-25 DIAGNOSIS — F17218 Nicotine dependence, cigarettes, with other nicotine-induced disorders: Secondary | ICD-10-CM

## 2020-01-25 DIAGNOSIS — E782 Mixed hyperlipidemia: Secondary | ICD-10-CM

## 2020-01-25 DIAGNOSIS — Z8701 Personal history of pneumonia (recurrent): Secondary | ICD-10-CM | POA: Diagnosis not present

## 2020-01-25 DIAGNOSIS — E119 Type 2 diabetes mellitus without complications: Secondary | ICD-10-CM | POA: Diagnosis not present

## 2020-01-25 DIAGNOSIS — S40812A Abrasion of left upper arm, initial encounter: Secondary | ICD-10-CM

## 2020-01-25 DIAGNOSIS — J438 Other emphysema: Secondary | ICD-10-CM | POA: Diagnosis not present

## 2020-01-25 DIAGNOSIS — L03115 Cellulitis of right lower limb: Secondary | ICD-10-CM

## 2020-01-25 DIAGNOSIS — S80211A Abrasion, right knee, initial encounter: Secondary | ICD-10-CM | POA: Diagnosis not present

## 2020-01-25 MED ORDER — CEPHALEXIN 500 MG PO CAPS: 500.0000 mg | ORAL_CAPSULE | Freq: Two times a day (BID) | ORAL | 0 refills | Status: DC

## 2020-01-25 NOTE — Progress Notes (Addendum)
Subjective:  Patient ID: Wayne [name], male    DOB: 02-11-1946  Age: 74 y.o. MRN: [id number]  Chief Complaint  Patient presents with  . Diabetes  . Hyperlipidemia  . Hypertension   HPI  Patient presents with type 2 diabetes mellitus with diabetic polyneuropathy.  Specifically, this is type 2, non-insulin requiring diabetes without complications.  He tries to eat healthy.   In regard to preventative care, he performs foot self-exams daily and his last ophthalmology exam was in 07/2018.  Currently on no diabetes medicines.  Pt presents with hyperlipidemia. Current treatment includes Lipitor, Aspirin 81 mg daily, and a low cholesterol/low fat diet. He denies experiencing any hypercholesterolemia related symptoms.      Pt presents for follow up of hypertension.  His current cardiac medication regimen includes a calcium channel blocker ( AMLODIPINE 5 mg once daily), Metoprolol XL 25 mg once daily, and an angiotensin receptor blocker ( LOSARTAN 100 mg once daily ).  He is tolerating the medication well without side effects.  Compliance with treatment has been good; he takes his medication as directed, maintains his diet and exercise regimen, and follows up as directed.    Pt has COPD and becomes short of breath with ambulation. Can walk about 1/2 mile Only using albuterol MDI.  Per patient no other inhalers helped.  BPH: Currently on flomax, cialis, and finasteride which helps with urination.  Pt has back pain that runs up his spine under his left arm into his chest. Comes on an intermittent basis.  He has seen Dr Vertell Limber, a chiropractor, and Dr. Lovenia Shuck for an [facility].  [place] on Saturday. Tripped.  Abrasions of right knee, left knee and left elbow.  Past Medical History:  Diagnosis Date  . BPH (benign prostatic hyperplasia)   . COPD (chronic obstructive pulmonary disease) ([place])   . Diabetes mellitus without complication ([place])   . GERD (gastroesophageal reflux disease)   .  Hypertension   . Male erectile disorder   . Meniere's disease   . Mixed hyperlipidemia   . Nontoxic single thyroid nodule   . Somnolence   . Thoracic aortic aneurysm [facility])    Past Surgical History:  Procedure Laterality Date  . CATARACT EXTRACTION Bilateral   . ELBOW SURGERY Right     Family History  Problem Relation Age of Onset  . Heart attack Mother   . Diabetes Father   . COPD Father   . Diverticulitis Brother    Social History   Socioeconomic History  . Marital status: Married    Spouse name: Not on file  . Number of children: 3  . Years of education: Not on file  . Highest education level: Not on file  Occupational History  . Not on file  Tobacco Use  . Smoking status: Current Every Day Smoker    Packs/day: 2.00    Types: Cigarettes  . Smokeless tobacco: Never Used  Substance and Sexual Activity  . Alcohol use: Yes    Comment: occasional  . Drug use: No  . Sexual activity: Not on file  Other Topics Concern  . Not on file  Social History Narrative  . Not on file   Social Determinants of Health   Financial Resource Strain:   . Difficulty of Paying Living Expenses: Not on file  Food Insecurity:   . Worried About Charity fundraiser in the Last Year: Not on file  . Ran Out of Food in the Last Year: Not on  file  Transportation Needs:   . Film/video editor (Medical): Not on file  . Lack of Transportation (Non-Medical): Not on file  Physical Activity:   . Days of Exercise per Week: Not on file  . Minutes of Exercise per Session: Not on file  Stress:   . Feeling of Stress : Not on file  Social Connections:   . Frequency of Communication with Friends and Family: Not on file  . Frequency of Social Gatherings with Friends and Family: Not on file  . Attends Religious Services: Not on file  . Active Member of Clubs or Organizations: Not on file  . Attends Archivist Meetings: Not on file  . Marital Status: Not on file    Review of Systems    Constitutional: Negative for chills, diaphoresis, fatigue and fever.  HENT: Negative for congestion, ear pain and sore throat.   Respiratory: Positive for shortness of breath. Negative for cough.   Cardiovascular: Positive for chest pain (Left side of chest is sore). Negative for leg swelling.  Gastrointestinal: Positive for constipation (prunes, miralax, stool softeners help.). Negative for abdominal pain, diarrhea, nausea and vomiting.  Endocrine: Positive for polydipsia. Negative for polyphagia and polyuria.  Genitourinary: Negative for dysuria and urgency.  Musculoskeletal: Positive for back pain. Negative for arthralgias and myalgias.  Neurological: Positive for light-headedness. Negative for dizziness, weakness and headaches.  Psychiatric/Behavioral: Positive for dysphoric mood. The patient is not nervous/anxious.      Objective:  BP 132/64   Pulse 78   Temp (!) 97.2 F (36.2 C)   Resp 18   Ht 5\' 7"  (1.702 m)   Wt 194 lb 9.6 oz (88.3 kg)   BMI 30.48 kg/m   BP/Weight 01/25/2020 3/81/8299 07/23/1694  Systolic BP 789 381 017  Diastolic BP 64 74 68  Wt. (Lbs) 194.6 190.4 191  BMI 30.48 29.82 29.91    Physical Exam Vitals reviewed.  Constitutional:      Appearance: Normal appearance. He is obese.  Cardiovascular:     Rate and Rhythm: Normal rate and regular rhythm.  Pulmonary:     Effort: Pulmonary effort is normal.     Breath sounds: Normal breath sounds.  Abdominal:     General: Abdomen is flat. Bowel sounds are normal.     Palpations: Abdomen is soft.  Musculoskeletal:        General: Swelling (rt knee. tender rt knee inferiorly. ) present.  Skin:    Findings: Abrasion (Right knee, left lower arm) and erythema (Right knee) present.     Comments: Right knee-Tender from fall   Neurological:     Mental Status: He is alert and oriented to person, place, and time.  Psychiatric:        Mood and Affect: Mood normal.        Behavior: Behavior normal.     Diabetic  Foot Exam - Simple   No data filed       Lab Results  Component Value Date   WBC 8.4 01/25/2020   HGB 14.8 01/25/2020   HCT 44.0 01/25/2020   PLT 288 01/25/2020   GLUCOSE 206 (H) 01/25/2020   CHOL 163 01/25/2020   TRIG 135 01/25/2020   HDL 36 (L) 01/25/2020   LDLDIRECT 170.3 07/02/2006   LDLCALC 103 (H) 01/25/2020   ALT 13 01/25/2020   AST 12 01/25/2020   NA 138 01/25/2020   K 4.0 01/25/2020   CL 99 01/25/2020   CREATININE 0.96 01/25/2020   BUN 11  01/25/2020   CO2 25 01/25/2020   TSH 2.540 10/20/2016   PSA 5.00 (H) 07/02/2006   HGBA1C 7.8 (H) 01/25/2020      Assessment & Plan:  1. Mixed hyperlipidemia Well controlled.  No changes to medicines.  Continue to work on eating a healthy diet and exercise.  Labs drawn today.  - Lipid panel - Ambulatory referral to Chronic Care Management Services  2. Essential hypertension Well controlled.  No changes to medicines.  Continue to work on eating a healthy diet and exercise.  Labs drawn today.  - Comprehensive metabolic panel - Ambulatory referral to Chronic Care Management Services  3. Combined hyperlipidemia associated with type 2 diabetes mellitus (HCC) Control: Good Recommend check feet daily. Recommend annual eye exams. Medicines: none Continue to work on eating a healthy diet and exercise.  Labs drawn today.   - CBC with Differential/Platelet - Hemoglobin A1c - POCT UA - Microalbumin - Ambulatory referral to Chronic Care Management Services  4. Other emphysema ([place]) Start on breztri 2 puffs twice a day.   5. Mild recurrent major depression ([place]) The current medical regimen is effective;  continue present plan and medications.  5. Abrasion of right knee, initial encounter Continue antibiotic ointment.  6. Abrasion of left upper extremity, initial encounter Continue antibiotic ointment.  7. Flu vaccine need - Flu Vaccine QUAD High Dose(Fluad)  8. History of pneumonia as indication for 23-polyvalent  pneumococcal polysaccharide vaccine - Pneumococcal polysaccharide vaccine 23-valent greater than or equal to 2yo subcutaneous/IM  9. Effusion of right knee Risks were discussed including bleeding, infection, atrophy at site of injection, and increased pain.  After consent was obtained, using sterile technique the rt knee was prepped with betadine and alcohol.  The joint was entered and 0 ml's of colored fluid was withdrawn.  No steroids injected. The procedure was well tolerated.   The patient is asked to continue to rest the joint for a few more days before resuming regular activities.  It may be more painful for the first 1-2 days.  Watch for fever, or increased swelling or persistent pain in the joint. Call or return to clinic prn if such symptoms occur or there is failure to improve as anticipated.  10. Cigarette nicotine dependence with other nicotine-induced disorder Recommend wean off tobacco.   11. Cellulitis of right knee  Rx: Keflex.  12. Thoracic aortic aneurysm   Follow-up: Return in about 3 months (around 04/25/2020) for fasting. Please schedule AWV in 03/2020 with Shelle Iron, LPN.Marland Kitchen  An After Visit Summary was printed and given to the patient.  Rochel Brome [facility] [phone]

## 2020-01-25 NOTE — Patient Instructions (Signed)
Recommend quit smoking using Serigne Kubicek method of quitting. Please reconsider Covid vaccination!

## 2020-01-26 LAB — COMPREHENSIVE METABOLIC PANEL
ALT: 13 IU/L (ref 0–44)
AST: 12 IU/L (ref 0–40)
Albumin/Globulin Ratio: 1.8 (ref 1.2–2.2)
Albumin: 4.5 g/dL (ref 3.7–4.7)
Alkaline Phosphatase: 84 IU/L (ref 48–121)
BUN/Creatinine Ratio: 11 (ref 10–24)
BUN: 11 mg/dL (ref 8–27)
Bilirubin Total: 0.5 mg/dL (ref 0.0–1.2)
CO2: 25 mmol/L (ref 20–29)
Calcium: 9.5 mg/dL (ref 8.6–10.2)
Chloride: 99 mmol/L (ref 96–106)
Creatinine, Ser: 0.96 mg/dL (ref 0.76–1.27)
GFR calc Af Amer: 88 mL/min/{1.73_m2} (ref >59)
GFR calc non Af Amer: 76 mL/min/{1.73_m2} (ref >59)
Globulin, Total: 2.5 g/dL (ref 1.5–4.5)
Glucose: 206 mg/dL — ABNORMAL HIGH (ref 65–99)
Potassium: 4 mmol/L (ref 3.5–5.2)
Sodium: 138 mmol/L (ref 134–144)
Total Protein: 7 g/dL (ref 6.0–8.5)

## 2020-01-26 LAB — CBC WITH DIFFERENTIAL/PLATELET
Basophils Absolute: 0 10*3/uL (ref 0.0–0.2)
Basos: 1 %
EOS (ABSOLUTE): 0.2 10*3/uL (ref 0.0–0.4)
Eos: 2 %
Hematocrit: 44 % (ref 37.5–51.0)
Hemoglobin: 14.8 g/dL (ref 13.0–17.7)
Immature Grans (Abs): 0 10*3/uL (ref 0.0–0.1)
Immature Granulocytes: 0 %
Lymphocytes Absolute: 1.5 10*3/uL (ref 0.7–3.1)
Lymphs: 18 %
MCH: 31 pg (ref 26.6–33.0)
MCHC: 33.6 g/dL (ref 31.5–35.7)
MCV: 92 fL (ref 79–97)
Monocytes Absolute: 0.7 10*3/uL (ref 0.1–0.9)
Monocytes: 8 %
Neutrophils Absolute: 6 10*3/uL (ref 1.4–7.0)
Neutrophils: 71 %
Platelets: 288 10*3/uL (ref 150–450)
RBC: 4.77 x10E6/uL (ref 4.14–5.80)
RDW: 12.6 % (ref 11.6–15.4)
WBC: 8.4 10*3/uL (ref 3.4–10.8)

## 2020-01-26 LAB — LIPID PANEL
Chol/HDL Ratio: 4.5 ratio (ref 0.0–5.0)
Cholesterol, Total: 163 mg/dL (ref 100–199)
HDL: 36 mg/dL — ABNORMAL LOW (ref >39)
LDL Chol Calc (NIH): 103 mg/dL — ABNORMAL HIGH (ref 0–99)
Triglycerides: 135 mg/dL (ref 0–149)
VLDL Cholesterol Cal: 24 mg/dL (ref 5–40)

## 2020-01-26 LAB — HEMOGLOBIN A1C
Est. average glucose Bld gHb Est-mCnc: 177 mg/dL
Hgb A1c MFr Bld: 7.8 % — ABNORMAL HIGH (ref 4.8–5.6)

## 2020-01-26 LAB — CARDIOVASCULAR RISK ASSESSMENT

## 2020-01-29 ENCOUNTER — Other Ambulatory Visit: Payer: Self-pay | Admitting: Family Medicine

## 2020-01-29 MED ORDER — EMPAGLIFLOZIN 10 MG PO TABS: 10.0000 mg | ORAL_TABLET | Freq: Every day | ORAL | 0 refills | Status: DC

## 2020-01-30 NOTE — Addendum Note (Signed)
Addended byRochel Brome on: 01/30/2020 09:45 PM   Modules accepted: Orders

## 2020-01-31 ENCOUNTER — Other Ambulatory Visit: Payer: Self-pay

## 2020-01-31 MED ORDER — GLUCOSE BLOOD VI STRP: ORAL_STRIP | 2 refills | Status: DC

## 2020-01-31 MED ORDER — ONETOUCH DELICA LANCETS 33G MISC: 2 refills | Status: DC

## 2020-02-01 ENCOUNTER — Other Ambulatory Visit: Payer: Self-pay

## 2020-02-01 MED ORDER — DAPAGLIFLOZIN PROPANEDIOL 5 MG PO TABS: 5.0000 mg | ORAL_TABLET | Freq: Every day | ORAL | 0 refills | Status: DC

## 2020-02-05 ENCOUNTER — Other Ambulatory Visit: Payer: Self-pay | Admitting: Family Medicine

## 2020-02-05 ENCOUNTER — Other Ambulatory Visit: Payer: Self-pay

## 2020-02-05 MED ORDER — ATORVASTATIN CALCIUM 40 MG PO TABS: 40.0000 mg | ORAL_TABLET | Freq: Every day | ORAL | 0 refills | Status: DC

## 2020-02-05 MED ORDER — ONETOUCH ULTRA 2 W/DEVICE KIT: PACK | 0 refills | Status: AC

## 2020-02-09 ENCOUNTER — Other Ambulatory Visit: Payer: Self-pay

## 2020-02-09 MED ORDER — GLUCOSE BLOOD VI STRP: ORAL_STRIP | 2 refills | Status: DC

## 2020-02-16 ENCOUNTER — Other Ambulatory Visit: Payer: Self-pay

## 2020-02-16 MED ORDER — GLUCOSE BLOOD VI STRP: ORAL_STRIP | 2 refills | Status: DC

## 2020-02-21 ENCOUNTER — Other Ambulatory Visit: Payer: Self-pay

## 2020-02-21 MED ORDER — ONETOUCH DELICA LANCETS 33G MISC: 2 refills | Status: AC

## 2020-02-21 MED ORDER — GLUCOSE BLOOD VI STRP: ORAL_STRIP | 2 refills | Status: AC

## 2020-02-28 ENCOUNTER — Other Ambulatory Visit: Payer: Self-pay | Admitting: Physician Assistant

## 2020-03-07 ENCOUNTER — Ambulatory Visit: Payer: Medicare Other

## 2020-03-07 ENCOUNTER — Other Ambulatory Visit: Payer: Self-pay

## 2020-03-07 DIAGNOSIS — E782 Mixed hyperlipidemia: Secondary | ICD-10-CM

## 2020-03-07 DIAGNOSIS — I10 Essential (primary) hypertension: Secondary | ICD-10-CM

## 2020-03-07 NOTE — Chronic Care Management (AMB) (Signed)
Chronic Care Management Pharmacy  Name: Wayne [name]  MRN: [id number] DOB: 1945-07-07  Chief Complaint/ HPI  Wayne [name],  74 y.o. , male presents for their Follow-Up CCM visit with the clinical pharmacist via telephone due to COVID-19 Pandemic.  PCP : Wayne Brome, MD  Their chronic conditions include: hypertension, emphysema, diabetes, memory loss, vertigo, hyperlipidemia, depression, lumbar back pain.   Office Visits: 01/25/2020 - Begin Breztri 2 puffs twice daily. Flu vaccine and Pneumovax 23 given. Rx for Keflex given for cellulitis of right knee.  12/07/2019 - Patient agreed to a trial of wellbutrin for smoking cessation. Prednisone and tizanidine given for acute back pain.  Consult Visit: n/a Medications: Outpatient Encounter Medications as of 03/07/2020  Medication Sig  . acetaminophen (TYLENOL) 500 MG tablet Take 1 tablet (500 mg total) by mouth every 6 (six) hours as needed. (Patient not taking: Reported on 12/05/2019)  . albuterol (PROVENTIL) (2.5 MG/3ML) 0.083% nebulizer solution Inhale into the lungs. (Patient not taking: Reported on 12/05/2019)  . albuterol (VENTOLIN HFA) 108 (90 Base) MCG/ACT inhaler Inhale 2 puffs into the lungs every 6 (six) hours as needed for wheezing or shortness of breath.  Marland Kitchen amLODipine (NORVASC) 5 MG tablet TAKE 1 TABLET BY MOUTH  DAILY  . aspirin 81 MG chewable tablet Chew 81 mg by mouth daily.  . Aspirin-Salicylamide-Caffeine (BC HEADACHE POWDER PO) Take 1 packet by mouth in the morning and at bedtime.  Marland Kitchen atorvastatin (LIPITOR) 40 MG tablet Take 1 tablet (40 mg total) by mouth daily.  . Blood Glucose Monitoring Suppl (ONE TOUCH ULTRA 2) w/Device KIT Use daily to check blood sugar E11.9  . Budeson-Glycopyrrol-Formoterol (BREZTRI AEROSPHERE) 160-9-4.8 MCG/ACT AERO Inhale into the lungs.  Marland Kitchen buPROPion (WELLBUTRIN [name]) 150 MG 12 hr tablet Take 1 tablet (150 mg total) by mouth 2 (two) times daily.  . cephALEXin (KEFLEX) 500 MG capsule  Take 1 capsule (500 mg total) by mouth 2 (two) times daily.  . dapagliflozin propanediol (FARXIGA) 5 MG TABS tablet Take 1 tablet (5 mg total) by mouth daily before breakfast.  . DULoxetine (CYMBALTA) 60 MG capsule TAKE 1 CAPSULE BY MOUTH  ONCE DAILY  . finasteride (PROSCAR) 5 MG tablet Take 5 mg by mouth at bedtime.   . fluticasone (FLONASE) 50 MCG/ACT nasal spray Place 2 sprays into both nostrils daily as needed for allergies or rhinitis.  . fluticasone (FLONASE) 50 MCG/ACT nasal spray USE 2 SPRAY(S) IN EACH NOSTRIL ONCE DAILY AS NEEDED FOR ALLERGIES OR RHIITIS  . glucose blood test strip 1 each by Other route as needed. Use as instructed  . glucose blood test strip Use new test strip each time when checking blood sugar  . ipratropium-albuterol (DUONEB) 0.5-2.5 (3) MG/3ML SOLN Take 3 mLs by nebulization every 6 (six) hours as needed.  Marland Kitchen losartan (COZAAR) 100 MG tablet TAKE 1 TABLET BY MOUTH  DAILY  . Melatonin 10 MG TABS Take 5 mg by mouth at bedtime.  . metoprolol succinate (TOPROL-XL) 25 MG 24 hr tablet Take 25 mg by mouth daily. (Patient not taking: Reported on 12/05/2019)  . omeprazole (PRILOSEC) 40 MG capsule TAKE 1 CAPSULE BY MOUTH  DAILY  . OneTouch Delica Lancets 02M MISC Use a new lancet each time when checking blood sugar  . predniSONE (DELTASONE) 20 MG tablet Take 1 tablet (20 mg total) by mouth 2 (two) times daily with a meal.  . sildenafil (VIAGRA) 100 MG tablet TAKE 1 TABLET BY MOUTH AS NEEDED APPROXIMATELY 1  HOUR BEFORE SEXUAL ACTIVITY  . tadalafil (CIALIS) 5 MG tablet Once daily for BPH.  . tamsulosin (FLOMAX) 0.4 MG CAPS capsule TAKE 1 CAPSULE BY MOUTH  DAILY  . tiZANidine (ZANAFLEX) 4 MG tablet Take 1 tablet (4 mg total) by mouth 3 (three) times daily. (Patient taking differently: Take 2 mg by mouth. Daily at bedtime and as needed during the day for back pain.)  . traZODone (DESYREL) 50 MG tablet TAKE 2 AND 1/2 TABLETS BY  MOUTH 1 TIME AT NIGHT (Patient taking differently: Take  50 mg by mouth at bedtime. )   No facility-administered encounter medications on file as of 03/07/2020.   Allergies  Allergen Reactions  . Gabapentin Other (See Comments)    Adverse reaction  . Sertraline Other (See Comments)    Adverse reaction  . Metformin Diarrhea and Nausea Only  . Pineapple Hives  . Pravastatin Other (See Comments)  . Lorazepam Other (See Comments)    Shakiness   SDOH Screenings   Alcohol Screen:   . Last Alcohol Screening Score (AUDIT): Not on file  Depression (PHQ2-9): Low Risk   . PHQ-2 Score: 0  Financial Resource Strain:   . Difficulty of Paying Living Expenses: Not on file  Food Insecurity:   . Worried About Charity fundraiser in the Last Year: Not on file  . Ran Out of Food in the Last Year: Not on file  Housing:   . Last Housing Risk Score: Not on file  Physical Activity:   . Days of Exercise per Week: Not on file  . Minutes of Exercise per Session: Not on file  Social Connections:   . Frequency of Communication with Friends and Family: Not on file  . Frequency of Social Gatherings with Friends and Family: Not on file  . Attends Religious Services: Not on file  . Active Member of Clubs or Organizations: Not on file  . Attends Archivist Meetings: Not on file  . Marital Status: Not on file  Stress:   . Feeling of Stress : Not on file  Tobacco Use: High Risk  . Smoking Tobacco Use: Current Every Day Smoker  . Smokeless Tobacco Use: Never Used  Transportation Needs:   . Film/video editor (Medical): Not on file  . Lack of Transportation (Non-Medical): Not on file     Current Diagnosis/Assessment:  Goals Addressed            This Visit's Progress   . Pharmacy Care Plan       CARE PLAN ENTRY (see longitudinal plan of care for additional care plan information)  Current Barriers:  . Chronic Disease Management support, education, and care coordination needs related to Hypertension, Hyperlipidemia, Diabetes, and COPD    Hypertension BP Readings from Last 3 Encounters:  01/25/20 132/64  12/07/19 132/74  10/19/19 124/68   . Pharmacist Clinical Goal(s): o Over the next 90 days, patient will work with PharmD and providers to maintain BP goal <130/80 . Current regimen:  o Amlodipine 5 mg daily o Losartan 100 mg daily . Interventions: o Recommend patient check bp 2-3 times weekly and bring to this week's appointment.  . Patient self care activities - Over the next 90 days, patient will: o Check BP 2-3 times weekly, document, and provide at future appointments o Ensure daily salt intake < 2300 mg/day  Hyperlipidemia Lab Results  Component Value Date/Time   LDLCALC 103 (H) 01/25/2020 11:33 AM   LDLDIRECT 170.3 07/02/2006 08:07 AM   .  Pharmacist Clinical Goal(s): o Over the next 90 days, patient will work with PharmD and providers to achieve LDL goal < 70 . Current regimen:  o Atorvastatin 40 mg daily o Asprin 81 mg daily . Interventions: o Reviewed any changes in diet or lifestyle with patient. Patient denies any changes that could be impacting cholesterol. o Discussed risks of BC powder twice daily for blood thinning.  o Discussed benefits of smoking cessation and patient's willingness to quit. Patient is not ready at this time. . Patient self care activities - Over the next 90 days, patient will: o Continue taking medication as prescribed and reaching out to provider with questions/concerns.   Diabetes Lab Results  Component Value Date/Time   HGBA1C 7.8 (H) 01/25/2020 11:33 AM   HGBA1C 6.6 (H) 10/19/2019 11:48 AM   . Pharmacist Clinical Goal(s): o Over the next 90 days, patient will work with PharmD and providers to achieve A1c goal <7% . Current regimen:  o Farxiga 5 mg daily  . Interventions: o Recommend continuing to stay active in yard and garden each day.  o Recommend continuing to eat healthy diet to maintain good control.  o Discussed risks associated with smoking and options for  smoking cessation.  o Reviewed home blood sugar readings >200 mg/dL fasting and post-prandial. Will discuss with Dr. Tobie Poet option of increasing dose of Farxiga to 10 mg daily.  o Patient feels that Wilder Glade is causing some lightheadedness and recommended trying to change to evening administration.  . Patient self care activities - Over the next 90 days, patient will: o Continue healthy diet and active lifestyle.  o Contact provider with any episodes of hypoglycemia o Increase Farxiga to 10 mg daily if approved by Dr. Tobie Poet.   Emphysema . Pharmacist Clinical Goal(s) o Over the next 90 days, patient will work with PharmD and providers to obtain maintenance inhaler   . Current regimen:  o Duoneb nebulizer solution prn o Breztri 2 puffs twice daily o Albuterol prn rescue inhaler . Interventions: o Recommend patient use a maintenance inhaler daily as preventative.  o Discussed picking up samples and providing information and signature for patient assistance.  o Pharmacist determining cost of albuterol inhaler and coordinating filling with patient if cost effective.  o Discussed smoking cessation options and recommended calling the [place] Quit Line.  o Encouraged COVID vaccine.  . Patient self care activities - Over the next 90 days, patient will: o Patient will pick-up samples of Breztri until patient assistance approved.   Medication management . Pharmacist Clinical Goal(s): o Over the next 90 days, patient will work with PharmD and providers to maintain optimal medication adherence . Current pharmacy: [facility]  . Interventions o Comprehensive medication review performed. o Continue current medication management strategy . Patient self care activities - Over the next 90 days, patient will: o Focus on medication adherence by using pill box.  o Take medications as prescribed o Report any questions or concerns to PharmD and/or provider(s)  Please see past updates related to this  goal by clicking on the "Past Updates" button in the selected goal         COPD / Asthma / Tobacco   Eosinophil count:   Lab Results  Component Value Date/Time   EOSPCT 2 05/13/2017 03:52 PM  %                               Eos (  Absolute):  Lab Results  Component Value Date/Time   EOSABS 0.2 01/25/2020 11:33 AM    Tobacco Status:  Social History   Tobacco Use  Smoking Status Current Every Day Smoker  . Packs/day: 2.00  . Types: Cigarettes  Smokeless Tobacco Never Used    Patient has failed these meds in past: Trelegy Patient is currently uncontrolled on the following medications:   Duoneb nebulizer solution prn   Breztri - 2 puffs twice daily  Albuterol prn rescue inhaler Using maintenance inhaler regularly? No Frequency of rescue inhaler use:  never  We discussed:  proper inhaler technique Patient reports exercise intolerance. Patient states that he produces a whitish phlegm after using Breztri inhaler. Pharmacist will discuss with Dr. Sedalia Muta. Patient states this is occurring daily and notices more after using inhaler. Denies fever, chills or discolored sputum. Denies symptoms of indigestion or acid reflux as well.   Plan  Continue current medications.  ,   Tobacco Abuse   Tobacco Status:  Social History   Tobacco Use  Smoking Status Current Every Day Smoker  . Packs/day: 2.00  . Types: Cigarettes  Smokeless Tobacco Never Used    Patient has failed these meds in past: n/a Patient is currently uncontrolled on the following medications:  . Bupropion [name] 150 mg bid - not taking currently  We discussed:  Provided contact information for [place] [facility]uit Line (1-800-QUIT-NOW) and encouraged patient to reach out to this group for support.  Patient not wanting to take bupropion at this time. Wants to get everything else straightened out before committing to quit.   Plan  Recommend patient call [place] Quit Line for smoking cessation support and begin bupropion prn. .     Diabetes   Recent Relevant Labs: Lab Results  Component Value Date/Time   HGBA1C 7.8 (H) 01/25/2020 11:33 AM   HGBA1C 6.6 (H) 10/19/2019 11:48 AM    Kidney Function Lab Results  Component Value Date/Time   CREATININE 0.96 01/25/2020 11:33 AM   CREATININE 0.84 10/19/2019 11:48 AM   GFRNONAA 76 01/25/2020 11:33 AM   GFRAA 88 01/25/2020 11:33 AM   K 4.0 01/25/2020 11:33 AM   K 4.0 10/19/2019 11:48 AM   Checking BG: daily AM fasting: >200 mg/dL  Patient has failed these meds in past: metformin Patient is currently controlled on the following medications:   Farxiga 5 mg daily   Last diabetic Foot exam: 09/18/2019 Last diabetic Eye exam: uncertain - record not available in chart  We discussed: diet and exercise extensively. Patient's blood sugar >200 mg/dL recently. He feels lightheaded after taking [place].  We discussed taking it in the evening to avoid helping improve symptoms of this. Patient's blood sugar is elevated with 5 mg of Farxiga. Pharmacist will discuss increase to 10 mg with Dr. Sedalia Muta. Patient [place] through [place] [facility] [place] [facility].   Plan  Consider increasing Farxiga to 10 mg daily.    Hypertension   BP today is:  Unsure - not checking  Office blood pressures are  BP Readings from Last 3 Encounters:  01/25/20 132/64  12/07/19 132/74  10/19/19 124/68   Kidney Function Lab Results  Component Value Date/Time   CREATININE 0.96 01/25/2020 11:33 AM   CREATININE 0.84 10/19/2019 11:48 AM   GFRNONAA 76 01/25/2020 11:33 AM   GFRAA 88 01/25/2020 11:33 AM   K 4.0 01/25/2020 11:33 AM   K 4.0 10/19/2019 11:48 AM     Patient has failed these meds in the past: metoprolol succinate Patient  is currently controlled on the following medications:   Amlodipine 5 mg daily  Losartan 100 mg daily  Patient checks BP at home infrequently  Patient home BP readings are ranging: n/a  We discussed diet and exercise extensively. Patient is not checking bp at  home but states well controlled at appointments. Still has complaints of some lightheadedness.   Plan  Continue current medications     Hyperlipidemia    Lipid Panel     Component Value Date/Time   CHOL 163 01/25/2020 1133   TRIG 135 01/25/2020 1133   HDL 36 (L) 01/25/2020 1133   LDLCALC 103 (H) 01/25/2020 1133   LDLDIRECT 170.3 07/02/2006 0807    Hepatic Function Latest Ref Rng & Units 01/25/2020 10/19/2019 07/02/2006  Total Protein 6.0 - 8.5 g/dL 7.0 6.9 6.7  Albumin 3.7 - 4.7 g/dL 4.5 4.2 3.8  AST 0 - 40 IU/L $Remov'12 15 26  'pmHxgd$ ALT 0 - 44 IU/L 13 14 36  Alk Phosphatase 48 - 121 IU/L 84 82 70  Total Bilirubin 0.0 - 1.2 mg/dL 0.5 0.5 0.8  Bilirubin, Direct 0.0 - 0.3 mg/dL - - 0.2     The 10-year ASCVD risk score Mikey Bussing DC Jr., et al., 2013) is: 55.6%   Values used to calculate the score:     Age: 70 years     Sex: Male     Is Non-Hispanic African American: No     Diabetic: Yes     Tobacco smoker: Yes     Systolic Blood Pressure: 161 mmHg     Is BP treated: Yes     HDL Cholesterol: 36 mg/dL     Total Cholesterol: 163 mg/dL   Patient has failed these meds in past: pravastatin Patient is currently uncontrolled on the following medications:  . aspirin 81 mg daily . Atorvastatin 40 mg daily  We discussed:  diet and exercise extensively.Patient denies medication related problems with increased dose. Patient's cholesterol remains above goal for heart disease <55. Patient is willing to increase dose if Dr. Tobie Poet approves.   Plan  Consider increasing Atorvastatin 80 mg daily.    GERD   Patient has failed these meds in past: n/a Patient is currently controlled on the following medications:  . Omeprazole 40 mg daily   We discussed:  Patient had significant acid reflux symptoms in the past. States good control with omeprazole. Patient denies sour taste or burning with productive phlegm.   Plan  Continue current medications   BPH   Patient has failed these meds in past:  n/a Patient is currently controlled on the following medications:  . Finasteride 5 mg daily at bedtime . Tamsulosin 0.4 mg daily  We discussed:   Patient reports that urinary symptoms are controlled. He has seen Dr. Nila Nephew previously.   Plan  Continue current medications   Neuropathy   Patient has failed these meds in past: lorazepam, nortriptyline Patient is currently controlled on the following medications:  . Duloxetine 60 mg daily  We discussed:  Patient reports worsened effects since sugar has increased. Encouraged improved blood sugar control.   Plan  Continue current medications   Insomnia   Patient has failed these meds in past: n/a Patient is currently controlled on the following medications:   Trazodone 50 mg - 2 tablets at night  Melatonin 5 mg - 2 to 3 tablets qhs  We discussed:  Sleeping better with current medications.   Plan  Continue current medications    Vaccines  Reviewed and discussed patient's vaccination history.  Encouraged patient to get COVID shot in office. Discussed the benefits. Patient is not interested at this time.   Immunization History  Administered Date(s) Administered  . Fluad Quad(high Dose 65+) 01/25/2020  . Influenza-Unspecified 03/18/2018  . Pneumococcal Polysaccharide-23 01/25/2020    Plan  Recommended patient receive annual flu vaccine in office.   Medication Management   [facility] medications Uses pill box? Yes Pt endorses good compliance  We discussed: Wife fills pill box each week. Patient uses mail order which is the most cost effective option.   Plan  Continue current medication management strategy    Follow up: 3 month phone visit

## 2020-03-07 NOTE — Patient Instructions (Signed)
Visit Information  Goals Addressed            This Visit's Progress   . Pharmacy Care Plan       CARE PLAN ENTRY (see longitudinal plan of care for additional care plan information)  Current Barriers:  . Chronic Disease Management support, education, and care coordination needs related to Hypertension, Hyperlipidemia, Diabetes, and COPD   Hypertension BP Readings from Last 3 Encounters:  01/25/20 132/64  12/07/19 132/74  10/19/19 124/68   . Pharmacist Clinical Goal(s): o Over the next 90 days, patient will work with PharmD and providers to maintain BP goal <130/80 . Current regimen:  o Amlodipine 5 mg daily o Losartan 100 mg daily . Interventions: o Recommend patient check bp 2-3 times weekly and bring to this week's appointment.  . Patient self care activities - Over the next 90 days, patient will: o Check BP 2-3 times weekly, document, and provide at future appointments o Ensure daily salt intake < 2300 mg/day  Hyperlipidemia Lab Results  Component Value Date/Time   LDLCALC 103 (H) 01/25/2020 11:33 AM   LDLDIRECT 170.3 07/02/2006 08:07 AM   . Pharmacist Clinical Goal(s): o Over the next 90 days, patient will work with PharmD and providers to achieve LDL goal < 70 . Current regimen:  o Atorvastatin 40 mg daily o Asprin 81 mg daily . Interventions: o Reviewed any changes in diet or lifestyle with patient. Patient denies any changes that could be impacting cholesterol. o Discussed risks of BC powder twice daily for blood thinning.  o Discussed benefits of smoking cessation and patient's willingness to quit. Patient is not ready at this time. . Patient self care activities - Over the next 90 days, patient will: o Continue taking medication as prescribed and reaching out to provider with questions/concerns.   Diabetes Lab Results  Component Value Date/Time   HGBA1C 7.8 (H) 01/25/2020 11:33 AM   HGBA1C 6.6 (H) 10/19/2019 11:48 AM   . Pharmacist Clinical  Goal(s): o Over the next 90 days, patient will work with PharmD and providers to achieve A1c goal <7% . Current regimen:  o Farxiga 5 mg daily  . Interventions: o Recommend continuing to stay active in yard and garden each day.  o Recommend continuing to eat healthy diet to maintain good control.  o Discussed risks associated with smoking and options for smoking cessation.  o Reviewed home blood sugar readings >200 mg/dL fasting and post-prandial. Will discuss with Dr. Tobie Poet option of increasing dose of Farxiga to 10 mg daily.  o Patient feels that Wilder Glade is causing some lightheadedness and recommended trying to change to evening administration.  . Patient self care activities - Over the next 90 days, patient will: o Continue healthy diet and active lifestyle.  o Contact provider with any episodes of hypoglycemia o Increase Farxiga to 10 mg daily if approved by Dr. Tobie Poet.   Emphysema . Pharmacist Clinical Goal(s) o Over the next 90 days, patient will work with PharmD and providers to obtain maintenance inhaler   . Current regimen:  o Duoneb nebulizer solution prn o Breztri 2 puffs twice daily o Albuterol prn rescue inhaler . Interventions: o Recommend patient use a maintenance inhaler daily as preventative.  o Discussed picking up samples and providing information and signature for patient assistance.  o Pharmacist determining cost of albuterol inhaler and coordinating filling with patient if cost effective.  o Discussed smoking cessation options and recommended calling the [place] Quit Line.  o Encouraged COVID  vaccine.  . Patient self care activities - Over the next 90 days, patient will: o Patient will pick-up samples of Breztri until patient assistance approved.   Medication management . Pharmacist Clinical Goal(s): o Over the next 90 days, patient will work with PharmD and providers to maintain optimal medication adherence . Current pharmacy: [facility]   . Interventions o Comprehensive medication review performed. o Continue current medication management strategy . Patient self care activities - Over the next 90 days, patient will: o Focus on medication adherence by using pill box.  o Take medications as prescribed o Report any questions or concerns to PharmD and/or provider(s)  Please see past updates related to this goal by clicking on the "Past Updates" button in the selected goal         Patient verbalizes understanding of instructions provided today.   Telephone follow up appointment with pharmacy team member scheduled for:05/2020  Sara Beth Malary Aylesworth, PharmD, [facility] Clinical Pharmacist [place] [phone] (office) [phone] (mobile)

## 2020-03-29 ENCOUNTER — Telehealth: Payer: Self-pay | Admitting: Family Medicine

## 2020-03-29 NOTE — Progress Notes (Signed)
°  Chronic Care Management   Outreach Note  03/29/2020 Name: Wayne [name] MRN: [id number] DOB: Oct 19, 1945  Referred by: Rochel Brome, MD Reason for referral : Chronic Care Management   An unsuccessful telephone outreach was attempted today. The patient was referred to the pharmacist for assistance with care management and care coordination.   Follow Up Plan:   Hilario Quarry  Upstream Scheduler

## 2020-04-01 ENCOUNTER — Other Ambulatory Visit: Payer: Self-pay | Admitting: Physician Assistant

## 2020-04-01 ENCOUNTER — Other Ambulatory Visit: Payer: Self-pay | Admitting: Family Medicine

## 2020-04-01 ENCOUNTER — Telehealth: Payer: Self-pay

## 2020-04-01 MED ORDER — RYBELSUS 3 MG PO TABS
1.0000 | ORAL_TABLET | Freq: Every day | ORAL | Status: DC
Start: 2020-04-01 — End: 2020-06-27

## 2020-04-01 NOTE — Chronic Care Management (AMB) (Signed)
Patient's wife calling in today with concerns of elevated blood sugar readings. Patient is currently taking Farxiga 10 mg daily. Reports diet is same. Patient has complaints of increased urination as well. Patient's wife reports that he has also lost weight.   Fasting blood sugar readings reported: 260, 140, 190, 274 mg/dL.   Lab Results  Component Value Date   CREATININE 0.96 01/25/2020   BUN 11 01/25/2020   GFRNONAA 76 01/25/2020   GFRAA 88 01/25/2020   NA 138 01/25/2020   K 4.0 01/25/2020   CALCIUM 9.5 01/25/2020   CO2 25 01/25/2020    Lab Results  Component Value Date/Time   HGBA1C 7.8 (H) 01/25/2020 11:33 AM   HGBA1C 6.6 (H) 10/19/2019 11:48 AM     Patient has tried metformin in the past but experienced diarrhea. Patient is willing to begin metformin xr version. If approved, pharmacist will counsel on eating with medication to prevent diarrhea.   Sherre Poot, PharmD, [facility] Clinical Pharmacist [facility] [phone] (office) [phone] (mobile)

## 2020-04-02 ENCOUNTER — Telehealth: Payer: Self-pay

## 2020-04-02 NOTE — Chronic Care Management (AMB) (Addendum)
Chronic Care Management Pharmacy Assistant   Name: Wayne [name]  MRN: [id number] DOB: 21-Sep-1945  Reason for Encounter: Patient Assistance Coordination/ Disease State Call  PCP : Rochel Brome, MD   04/01/2020- Patient assistance application filled out for Rybelsus 3 mg from [employer]. Form sent to Donette Larry, CPP for patient and provider signature.   Allergies:   Allergies  Allergen Reactions   Gabapentin Other (See Comments)    Adverse reaction   Sertraline Other (See Comments)    Adverse reaction   Metformin Diarrhea and Nausea Only   Pineapple Hives   Pravastatin Other (See Comments)   Lorazepam Other (See Comments)    Shakiness    Medications: Outpatient Encounter Medications as of 04/02/2020  Medication Sig   acetaminophen (TYLENOL) 500 MG tablet Take 1 tablet (500 mg total) by mouth every 6 (six) hours as needed. (Patient not taking: Reported on 12/05/2019)   albuterol (PROVENTIL) (2.5 MG/3ML) 0.083% nebulizer solution Inhale into the lungs. (Patient not taking: Reported on 12/05/2019)   albuterol (VENTOLIN HFA) 108 (90 Base) MCG/ACT inhaler Inhale 2 puffs into the lungs every 6 (six) hours as needed for wheezing or shortness of breath.   amLODipine (NORVASC) 5 MG tablet TAKE 1 TABLET BY MOUTH  DAILY   aspirin 81 MG chewable tablet Chew 81 mg by mouth daily.   Aspirin-Salicylamide-Caffeine (BC HEADACHE POWDER PO) Take 1 packet by mouth in the morning and at bedtime.   atorvastatin (LIPITOR) 40 MG tablet Take 1 tablet (40 mg total) by mouth daily.   Blood Glucose Monitoring Suppl (ONE TOUCH ULTRA 2) w/Device KIT Use daily to check blood sugar E11.9   Budeson-Glycopyrrol-Formoterol (BREZTRI AEROSPHERE) 160-9-4.8 MCG/ACT AERO Inhale into the lungs.   buPROPion (WELLBUTRIN SR) 150 MG 12 hr tablet Take 1 tablet (150 mg total) by mouth 2 (two) times daily.   cephALEXin (KEFLEX) 500 MG capsule Take 1 capsule (500 mg total) by mouth 2 (two) times daily.    dapagliflozin propanediol (FARXIGA) 5 MG TABS tablet Take 1 tablet (5 mg total) by mouth daily before breakfast. (Patient taking differently: Take 10 mg by mouth daily. )   DULoxetine (CYMBALTA) 60 MG capsule TAKE 1 CAPSULE BY MOUTH  ONCE DAILY   finasteride (PROSCAR) 5 MG tablet Take 5 mg by mouth at bedtime.    fluticasone (FLONASE) 50 MCG/ACT nasal spray Place 2 sprays into both nostrils daily as needed for allergies or rhinitis.   fluticasone (FLONASE) 50 MCG/ACT nasal spray USE 2 SPRAY(S) IN EACH NOSTRIL ONCE DAILY AS NEEDED FOR ALLERGIES OR RHIITIS   glucose blood test strip 1 each by Other route as needed. Use as instructed   glucose blood test strip Use new test strip each time when checking blood sugar   ipratropium-albuterol (DUONEB) 0.5-2.5 (3) MG/3ML SOLN Take 3 mLs by nebulization every 6 (six) hours as needed.   losartan (COZAAR) 100 MG tablet TAKE 1 TABLET BY MOUTH  DAILY   Melatonin 10 MG TABS Take 5 mg by mouth at bedtime.   metoprolol succinate (TOPROL-XL) 25 MG 24 hr tablet Take 25 mg by mouth daily. (Patient not taking: Reported on 12/05/2019)   omeprazole (PRILOSEC) 40 MG capsule TAKE 1 CAPSULE BY MOUTH  DAILY   OneTouch Delica Lancets 09M MISC Use a new lancet each time when checking blood sugar   predniSONE (DELTASONE) 20 MG tablet Take 1 tablet (20 mg total) by mouth 2 (two) times daily with a meal.   Semaglutide (RYBELSUS)  3 MG TABS Take 1 tablet by mouth daily.   sildenafil (VIAGRA) 100 MG tablet TAKE 1 TABLET BY MOUTH AS NEEDED APPROXIMATELY  1  HOUR  BEFORE  SEXUAL  ACTIVITY   tadalafil (CIALIS) 5 MG tablet Once daily for BPH.   tamsulosin (FLOMAX) 0.4 MG CAPS capsule TAKE 1 CAPSULE BY MOUTH  DAILY   tiZANidine (ZANAFLEX) 4 MG tablet Take 1 tablet (4 mg total) by mouth 3 (three) times daily. (Patient taking differently: Take 2 mg by mouth. Daily at bedtime and as needed during the day for back pain.)   traZODone (DESYREL) 50 MG tablet TAKE 2 AND 1/2 TABLETS BY  MOUTH 1  TIME AT NIGHT (Patient taking differently: Take 50 mg by mouth at bedtime. )   No facility-administered encounter medications on file as of 04/02/2020.    Current Diagnosis: Patient Active Problem List   Diagnosis Date Noted   Encounter for smoking cessation counseling 12/07/2019   Acute thoracic back pain 12/07/2019   Thoracic aortic aneurysm without rupture ([place]) 12/07/2019   Thyroid nodule 12/07/2019   Other emphysema ([place]) 10/28/2019   Mild recurrent major depression ([place]) 10/28/2019   Lumbar back pain 10/28/2019   Overweight with body mass index (BMI) of 29 to 29.9 in adult 10/28/2019   Mixed hyperlipidemia    Hypertension    Diabetes mellitus without complication ([place])    Memory loss 10/20/2016   Other headache syndrome 10/20/2016   Vertigo 10/20/2016   Abnormal brain MRI 10/20/2016   Mild cognitive impairment 10/20/2016   HYPERTENSION 05/30/2007   Recent Relevant Labs: Lab Results  Component Value Date/Time   HGBA1C 7.8 (H) 01/25/2020 11:33 AM   HGBA1C 6.6 (H) 10/19/2019 11:48 AM    Kidney Function Lab Results  Component Value Date/Time   CREATININE 0.96 01/25/2020 11:33 AM   CREATININE 0.84 10/19/2019 11:48 AM   GFRNONAA 76 01/25/2020 11:33 AM   GFRAA 88 01/25/2020 11:33 AM   04/10/2020- Called patient to check to and see how blood sugars are doing and how patient is tolerating Rybelsus. Patient states he is tolerating medication just fine, blood sugars are about the same or a little lower, but patient is still having trouble with urinary symptoms of stinging and burning, see blood and pus when he urinates. Patient states he completed antibiotics treatment given by Dr Holly Bodily on 04/04/20 of Amoxicillin but still having urinary problems. Patient also complained about labs he had done on 04/04/20, blood work was not processed correctly, patient went yesterday to repeat labs for PSA and awaiting lab results. Patient is concerned about continued urinary stinging, blood and  pus. He is not sure what is going and is was frustrated with not having a result or resolution with this problem. Patient aware that I will relay this information to Donette Larry, CPP to discuss with CPP.    Current antihyperglycemic regimen:  Rybelsus 3 mg- 1 tablet daily What recent interventions/DTPs have been made to improve glycemic control:  Farxiga 10 mg discontinued, patient is taking Rybelsus 3 mg daily to help with blood sugars. Patient continues to watch diet.  Have there been any recent hospitalizations or ED visits since last visit with CPP? No Patient denies hypoglycemic symptoms, including Pale, Sweaty, Shaky, Hungry and Nervous/irritable Patient denies hyperglycemic symptoms, including blurry vision, excessive thirst, fatigue, polyuria and weakness How often are you checking your blood sugar? twice daily What are your blood sugars ranging?  Fasting: 170 Before meals: n/a After meals: n/a Bedtime: 236 During  the week, how often does your blood glucose drop below 70? Never Are you checking your feet daily/regularly? Yes  Adherence Review: Is the patient currently on a STATIN medication? Yes- Atorvastatin 40 mg Is the patient currently on ACE/ARB medication? Yes- Losartan 100 mg Does the patient have >5 day gap between last estimated fill dates? No    Follow-Up:  Patient Assistance Coordination and Pharmacist Review   Per Pharm D, unable to contact discuss with Dr. Tobie Poet patient symptoms, advised to give patient on call provider information. Also advised to let patient know that she apologizes he is going through continued urinary problems, she also expects his sugars to come down with Rybelsus but it might take some time.  Called patient back, spoke with him and his wife Caren Griffins, they are aware of pharmacist advisement. They were given on call provider phone number [phone] and will call if symptoms can not be tolerated over the holiday and weekend. Patient will continue to  monitor blood sugars and patient is aware that we will follow up with him to check on symptoms.  04/16/2020- Patient was able to speak to get another medication sent in on 11/24 and Urology referral reordered Urgent.  Donette Larry, CPP notified.  Pattricia Boss, [place] Pharmacist Assistant [phone]

## 2020-04-04 ENCOUNTER — Other Ambulatory Visit: Payer: Self-pay

## 2020-04-04 ENCOUNTER — Ambulatory Visit (INDEPENDENT_AMBULATORY_CARE_PROVIDER_SITE_OTHER): Payer: Medicare Other | Admitting: Family Medicine

## 2020-04-04 ENCOUNTER — Encounter: Payer: Self-pay | Admitting: Family Medicine

## 2020-04-04 VITALS — BP 122/72 | HR 90 | Temp 97.7°F | Ht 67.0 in | Wt 189.4 lb

## 2020-04-04 DIAGNOSIS — R319 Hematuria, unspecified: Secondary | ICD-10-CM

## 2020-04-04 DIAGNOSIS — R32 Unspecified urinary incontinence: Secondary | ICD-10-CM | POA: Diagnosis not present

## 2020-04-04 DIAGNOSIS — R309 Painful micturition, unspecified: Secondary | ICD-10-CM

## 2020-04-04 LAB — POCT URINALYSIS DIPSTICK
Bilirubin, UA: NEGATIVE
Blood, UA: POSITIVE
Glucose, UA: POSITIVE — AB
Ketones, UA: NEGATIVE
Nitrite, UA: NEGATIVE
Protein, UA: POSITIVE — AB
Spec Grav, UA: 1.015 (ref 1.010–1.025)
Urobilinogen, UA: 0.2 E.U./dL
pH, UA: 5.5 (ref 5.0–8.0)

## 2020-04-04 MED ORDER — AMOXICILLIN 250 MG PO CAPS: 250.0000 mg | ORAL_CAPSULE | Freq: Two times a day (BID) | ORAL | 0 refills | Status: DC

## 2020-04-04 NOTE — Patient Instructions (Signed)
Urinary Tract Infection, Adult A urinary tract infection (UTI) is an infection of any part of the urinary tract. The urinary tract includes:  The kidneys.  The ureters.  The bladder.  The urethra. These organs make, store, and get rid of pee (urine) in the body. What are the causes? This is caused by germs (bacteria) in your genital area. These germs grow and cause swelling (inflammation) of your urinary tract. What increases the risk? You are more likely to develop this condition if:  You have a small, thin tube (catheter) to drain pee.  You cannot control when you pee or poop (incontinence).  You are male, and: ? You use these methods to prevent pregnancy:  A medicine that kills sperm (spermicide).  A device that blocks sperm (diaphragm). ? You have low levels of a male hormone (estrogen). ? You are pregnant.  You have genes that add to your risk.  You are sexually active.  You take antibiotic medicines.  You have trouble peeing because of: ? A prostate that is bigger than normal, if you are male. ? A blockage in the part of your body that drains pee from the bladder (urethra). ? A kidney stone. ? A nerve condition that affects your bladder (neurogenic bladder). ? Not getting enough to drink. ? Not peeing often enough.  You have other conditions, such as: ? Diabetes. ? A weak disease-fighting system (immune system). ? Sickle cell disease. ? Gout. ? Injury of the spine. What are the signs or symptoms? Symptoms of this condition include:  Needing to pee right away (urgently).  Peeing often.  Peeing small amounts often.  Pain or burning when peeing.  Blood in the pee.  Pee that smells bad or not like normal.  Trouble peeing.  Pee that is cloudy.  Fluid coming from the vagina, if you are male.  Pain in the belly or lower back. Other symptoms include:  Throwing up (vomiting).  No urge to eat.  Feeling mixed up (confused).  Being tired  and grouchy (irritable).  A fever.  Watery poop (diarrhea). How is this treated? This condition may be treated with:  Antibiotic medicine.  Other medicines.  Drinking enough water. Follow these instructions at home:  Medicines  Take over-the-counter and prescription medicines only as told by your doctor.  If you were prescribed an antibiotic medicine, take it as told by your doctor. Do not stop taking it even if you start to feel better. General instructions  Make sure you: ? Pee until your bladder is empty. ? Do not hold pee for a long time. ? Empty your bladder after sex. ? Wipe from front to back after pooping if you are a male. Use each tissue one time when you wipe.  Drink enough fluid to keep your pee pale yellow.  Keep all follow-up visits as told by your doctor. This is important. Contact a doctor if:  You do not get better after 1-2 days.  Your symptoms go away and then come back. Get help right away if:  You have very bad back pain.  You have very bad pain in your lower belly.  You have a fever.  You are sick to your stomach (nauseous).  You are throwing up. Summary  A urinary tract infection (UTI) is an infection of any part of the urinary tract.  This condition is caused by germs in your genital area.  There are many risk factors for a UTI. These include having a small, thin   tube to drain pee and not being able to control when you pee or poop.  Treatment includes antibiotic medicines for germs.  Drink enough fluid to keep your pee pale yellow. This information is not intended to replace advice given to you by your health care provider. Make sure you discuss any questions you have with your health care provider. Document Revised: 04/21/2018 Document Reviewed: 11/11/2017 Elsevier Patient Education  2020 Elsevier Inc.  

## 2020-04-04 NOTE — Progress Notes (Addendum)
Acute Office Visit  Subjective:    Patient ID: Wayne [name], male    DOB: 12/06/1945, 74 y.o.   MRN: [id number]  Chief Complaint  Patient presents with  . Dysuria    HPI Patient is in today for  pain with urination. Pt noted color change in urine to-brown for several days. No odor. Pt with nocturia x3 . Pt is  taking Farxiga 32m over the past month-increased from 564m Pt started on Rybelsus this morning by SaClarise Cruzeth/Dr. Cox  as fasting glucose 190 and after meals 260 last night.Pt can not tolerate metformin. Pt denies fever, abdominal or back pain(different than previous). Pt states he has no h/o of prior urinary or prostate infections. Pt with no h/o of renal disease. Pt with no h/o kidney stones.   Past Medical History:  Diagnosis Date  . BPH (benign prostatic hyperplasia)   . COPD (chronic obstructive pulmonary disease) ([facility]  . Diabetes mellitus without complication ([facility]  . GERD (gastroesophageal reflux disease)   . Hypertension   . Male erectile disorder   . Meniere's disease   . Mixed hyperlipidemia   . Nontoxic single thyroid nodule   . Somnolence   . [facility]    Past Surgical History:  Procedure Laterality Date  . CATARACT EXTRACTION Bilateral   . ELBOW SURGERY Right     Family History  Problem Relation Age of Onset  . Heart attack Mother   . Diabetes Father   . COPD Father   . Diverticulitis Brother     Social History   Socioeconomic History  . Marital status: Married    Spouse name: Not on file  . Number of children: 3  . Years of education: Not on file  . Highest education level: Not on file  Occupational History  . Not on file  Tobacco Use  . Smoking status: Current Every Day Smoker    Packs/day: 2.00    Types: Cigarettes  . Smokeless tobacco: Never Used  Substance and Sexual Activity  . Alcohol use: Yes    Comment: occasional  . Drug use: No  . Sexual activity: Not on file  Other Topics Concern  . Not on  file  Social History Narrative  . Not on file   Social Determinants of Health   Financial Resource Strain:   . Difficulty of Paying Living Expenses: Not on file  Food Insecurity:   . Worried About RuCharity fundraisern the Last Year: Not on file  . Ran Out of Food in the Last Year: Not on file  Transportation Needs:   . Lack of Transportation (Medical): Not on file  . Lack of Transportation (Non-Medical): Not on file  Physical Activity:   . Days of Exercise per Week: Not on file  . Minutes of Exercise per Session: Not on file  Stress:   . Feeling of Stress : Not on file  Social Connections:   . Frequency of Communication with Friends and Family: Not on file  . Frequency of Social Gatherings with Friends and Family: Not on file  . Attends Religious Services: Not on file  . Active Member of Clubs or Organizations: Not on file  . Attends ClArchivisteetings: Not on file  . Marital Status: Not on file  Intimate Partner Violence:   . Fear of Current or Ex-Partner: Not on file  . Emotionally Abused: Not on file  . Physically Abused: Not on file  .  Sexually Abused: Not on file    Outpatient Medications Prior to Visit  Medication Sig Dispense Refill  . acetaminophen (TYLENOL) 500 MG tablet Take 1 tablet (500 mg total) by mouth every 6 (six) hours as needed. 30 tablet 0  . albuterol (PROVENTIL) (2.5 MG/3ML) 0.083% nebulizer solution Inhale into the lungs.     Marland Kitchen albuterol (VENTOLIN HFA) 108 (90 Base) MCG/ACT inhaler Inhale 2 puffs into the lungs every 6 (six) hours as needed for wheezing or shortness of breath. 8.5 g 2  . amLODipine (NORVASC) 5 MG tablet TAKE 1 TABLET BY MOUTH  DAILY 90 tablet 1  . aspirin 81 MG chewable tablet Chew 81 mg by mouth daily.    . Aspirin-Salicylamide-Caffeine (BC HEADACHE POWDER PO) Take 1 packet by mouth in the morning and at bedtime.    Marland Kitchen atorvastatin (LIPITOR) 40 MG tablet Take 1 tablet (40 mg total) by mouth daily. 90 tablet 0  . Blood  Glucose Monitoring Suppl (ONE TOUCH ULTRA 2) w/Device KIT Use daily to check blood sugar E11.9 1 kit 0  . Budeson-Glycopyrrol-Formoterol (BREZTRI AEROSPHERE) 160-9-4.8 MCG/ACT AERO Inhale into the lungs.    Marland Kitchen buPROPion (WELLBUTRIN SR) 150 MG 12 hr tablet Take 1 tablet (150 mg total) by mouth 2 (two) times daily. 60 tablet 1  . dapagliflozin propanediol (FARXIGA) 5 MG TABS tablet Take 1 tablet (5 mg total) by mouth daily before breakfast. (Patient taking differently: Take 10 mg by mouth daily. ) 120 tablet 0  . DULoxetine (CYMBALTA) 60 MG capsule TAKE 1 CAPSULE BY MOUTH  ONCE DAILY 90 capsule 3  . finasteride (PROSCAR) 5 MG tablet Take 5 mg by mouth at bedtime.     . fluticasone (FLONASE) 50 MCG/ACT nasal spray Place 2 sprays into both nostrils daily as needed for allergies or rhinitis. 1 g 1  . fluticasone (FLONASE) 50 MCG/ACT nasal spray USE 2 SPRAY(S) IN EACH NOSTRIL ONCE DAILY AS NEEDED FOR ALLERGIES OR RHIITIS 16 g 0  . glucose blood test strip 1 each by Other route as needed. Use as instructed    . glucose blood test strip Use new test strip each time when checking blood sugar 100 each 2  . ipratropium-albuterol (DUONEB) 0.5-2.5 (3) MG/3ML SOLN Take 3 mLs by nebulization every 6 (six) hours as needed.    Marland Kitchen losartan (COZAAR) 100 MG tablet TAKE 1 TABLET BY MOUTH  DAILY 90 tablet 3  . Melatonin 10 MG TABS Take 5 mg by mouth at bedtime.    . metoprolol succinate (TOPROL-XL) 25 MG 24 hr tablet Take 25 mg by mouth daily.     Marland Kitchen omeprazole (PRILOSEC) 40 MG capsule TAKE 1 CAPSULE BY MOUTH  DAILY 90 capsule 3  . OneTouch Delica Lancets 45Y MISC Use a new lancet each time when checking blood sugar 100 each 2  . Semaglutide (RYBELSUS) 3 MG TABS Take 1 tablet by mouth daily. 30 tablet   . sildenafil (VIAGRA) 100 MG tablet TAKE 1 TABLET BY MOUTH AS NEEDED APPROXIMATELY  1  HOUR  BEFORE  SEXUAL  ACTIVITY 4 tablet 0  . tadalafil (CIALIS) 5 MG tablet Once daily for BPH. 30 tablet 5  . tamsulosin (FLOMAX) 0.4  MG CAPS capsule TAKE 1 CAPSULE BY MOUTH  DAILY 90 capsule 3  . tiZANidine (ZANAFLEX) 4 MG tablet Take 1 tablet (4 mg total) by mouth 3 (three) times daily. (Patient taking differently: Take 2 mg by mouth. Daily at bedtime and as needed during the day for back  pain.) 90 tablet 0  . traZODone (DESYREL) 50 MG tablet TAKE 2 AND 1/2 TABLETS BY  MOUTH 1 TIME AT NIGHT (Patient taking differently: Take 50 mg by mouth at bedtime. ) 225 tablet 3  . cephALEXin (KEFLEX) 500 MG capsule Take 1 capsule (500 mg total) by mouth 2 (two) times daily. 14 capsule 0  . predniSONE (DELTASONE) 20 MG tablet Take 1 tablet (20 mg total) by mouth 2 (two) times daily with a meal. 10 tablet 0   No facility-administered medications prior to visit.    Allergies  Allergen Reactions  . Gabapentin Other (See Comments)    Adverse reaction  . Sertraline Other (See Comments)    Adverse reaction  . Metformin Diarrhea and Nausea Only  . Pineapple Hives  . Pravastatin Other (See Comments)  . Lorazepam Other (See Comments)    Shakiness    Review of Systems  Respiratory: Positive for shortness of breath.   Endocrine:       DM-poorly controlled  Genitourinary: Positive for dysuria, frequency and urgency. Negative for difficulty urinating, flank pain and hematuria.  Musculoskeletal: Negative for back pain.       Objective:    Physical Exam Constitutional:      Appearance: Normal appearance.  Cardiovascular:     Rate and Rhythm: Normal rate and regular rhythm.     Pulses: Normal pulses.     Heart sounds: Normal heart sounds.  Pulmonary:     Effort: Pulmonary effort is normal.     Breath sounds: Examination of the right-lower field reveals decreased breath sounds. Examination of the left-lower field reveals decreased breath sounds. Decreased breath sounds present.  Abdominal:     Palpations: Abdomen is soft.  Neurological:     Mental Status: He is alert.     BP 122/72 (BP Location: Right Arm, Patient Position:  Sitting, Cuff Size: Normal)   Pulse 90   Temp 97.7 F (36.5 C) (Temporal)   Ht _0  (1.702 m)   Wt 189 lb 6.4 oz (85.9 kg)   SpO2 93%   BMI 29.66 kg/m  Wt Readings from Last 3 Encounters:  04/04/20 189 lb 6.4 oz (85.9 kg)  01/25/20 194 lb 9.6 oz (88.3 kg)  12/07/19 190 lb 6.4 oz (86.4 kg)    Health Maintenance Due  Topic Date Due  . Hepatitis C Screening  Never done  . FOOT EXAM  Never done  . OPHTHALMOLOGY EXAM  Never done  . COVID-19 Vaccine (1) Never done  . TETANUS/TDAP  Never done    There are no preventive care reminders to display for this patient.   Lab Results  Component Value Date   TSH 2.540 10/20/2016   Lab Results  Component Value Date   WBC 8.4 01/25/2020   HGB 14.8 01/25/2020   HCT 44.0 01/25/2020   MCV 92 01/25/2020   PLT 288 01/25/2020   Lab Results  Component Value Date   NA 138 01/25/2020   K 4.0 01/25/2020   CO2 25 01/25/2020   GLUCOSE 206 (H) 01/25/2020   BUN 11 01/25/2020   CREATININE 0.96 01/25/2020   BILITOT 0.5 01/25/2020   ALKPHOS 84 01/25/2020   AST 12 01/25/2020   ALT 13 01/25/2020   PROT 7.0 01/25/2020   ALBUMIN 4.5 01/25/2020   CALCIUM 9.5 01/25/2020   ANIONGAP 11 05/13/2017   Lab Results  Component Value Date   CHOL 163 01/25/2020   Lab Results  Component Value Date   HDL 36 (L)  01/25/2020   Lab Results  Component Value Date   LDLCALC 103 (H) 01/25/2020   Lab Results  Component Value Date   TRIG 135 01/25/2020   Lab Results  Component Value Date   CHOLHDL 4.5 01/25/2020   Lab Results  Component Value Date   HGBA1C 7.8 (H) 01/25/2020       Assessment & Plan:  1. Painful urination - POCT urinalysis dipstick Amoxil-rx - Urine Culture-pending Concern for possible infection due to [place] switched to Rybelsus   Whitt Auletta Hannah Beat, MD   11/22-d/w pts wife concerns about incontinence of urine, brown urine, stinging with urination. Pt denies back and abdominal pain. Pt with nocturia and urgency. Agreed  to PSA tomorrow. Pt seen by urology in the past. PSA 7.6 in 2013-6.7. pt with PSA 4.8 in 2018 and 3.6 in 2019. + assay for cancer risk and biopsy was recommended. Pt declined and has not been seen back in urology. Pt with incontinence of urine in the past-now worsening. D/w pt would recommend [facility] for additional evaluation.  Concern for worsening incontinence, nocturia, proteinuria, hematuria, h/o of  Elevated PSA and need for biopsy 2 years ago. REFERRAL TO [facility] 11/30-appt Dr. Abner Greenspan 8:15 tomorrow-PSA 5.1, continued concerns with incontinence of urine. Burning with urination noted with Rybelsus-pt stopped taking medication and glucose noted 216 this morning.

## 2020-04-07 ENCOUNTER — Other Ambulatory Visit: Payer: Self-pay | Admitting: Family Medicine

## 2020-04-07 LAB — URINE CULTURE

## 2020-04-09 ENCOUNTER — Other Ambulatory Visit: Payer: Self-pay | Admitting: Family Medicine

## 2020-04-09 DIAGNOSIS — R809 Proteinuria, unspecified: Secondary | ICD-10-CM

## 2020-04-09 NOTE — Addendum Note (Signed)
Addended by: Maryruth Hancock on: 04/09/2020 05:38 PM   Modules accepted: Orders

## 2020-04-10 ENCOUNTER — Other Ambulatory Visit: Payer: Self-pay | Admitting: Family Medicine

## 2020-04-10 ENCOUNTER — Other Ambulatory Visit: Payer: Medicare Other

## 2020-04-10 ENCOUNTER — Other Ambulatory Visit: Payer: Self-pay

## 2020-04-10 DIAGNOSIS — R809 Proteinuria, unspecified: Secondary | ICD-10-CM | POA: Diagnosis not present

## 2020-04-10 MED ORDER — CIPROFLOXACIN HCL 500 MG PO TABS: 500.0000 mg | ORAL_TABLET | Freq: Two times a day (BID) | ORAL | 0 refills | Status: DC

## 2020-04-11 LAB — PSA: Prostate Specific Ag, Serum: 5.1 ng/mL — ABNORMAL HIGH (ref 0.0–4.0)

## 2020-04-15 ENCOUNTER — Other Ambulatory Visit: Payer: Self-pay | Admitting: Family Medicine

## 2020-04-15 DIAGNOSIS — R809 Proteinuria, unspecified: Secondary | ICD-10-CM

## 2020-04-15 DIAGNOSIS — R32 Unspecified urinary incontinence: Secondary | ICD-10-CM

## 2020-04-15 DIAGNOSIS — R972 Elevated prostate specific antigen [PSA]: Secondary | ICD-10-CM

## 2020-04-15 DIAGNOSIS — R319 Hematuria, unspecified: Secondary | ICD-10-CM

## 2020-04-15 DIAGNOSIS — R309 Painful micturition, unspecified: Secondary | ICD-10-CM

## 2020-04-16 ENCOUNTER — Telehealth: Payer: Self-pay

## 2020-04-16 NOTE — Telephone Encounter (Signed)
Dr. Holly Bodily [facility] was contacted this morning about the STAT referral again, she informed me that she was calling the patient that she was sorry it wasn't done yesterday. I requested her to let us know ASAP when that appointment was scheduled an the urgency of the need of the appointment.

## 2020-04-17 DIAGNOSIS — N3000 Acute cystitis without hematuria: Secondary | ICD-10-CM | POA: Diagnosis not present

## 2020-04-17 DIAGNOSIS — R310 Gross hematuria: Secondary | ICD-10-CM | POA: Diagnosis not present

## 2020-04-18 ENCOUNTER — Ambulatory Visit: Payer: Medicare Other | Admitting: Family Medicine

## 2020-04-18 ENCOUNTER — Ambulatory Visit: Payer: Medicare Other

## 2020-04-19 ENCOUNTER — Telehealth: Payer: Self-pay

## 2020-04-19 NOTE — Telephone Encounter (Signed)
Called pt to check on urology appointment. States they checked his bladder and it was empty. States they also checked his prostate and said it was ok. Will be sent for a scan and some sort of scope. States urology also tested urine but he has not heard results yet. He says symptoms are still the same and he has peed in a cup several time and has seen "skin with black tails."

## 2020-04-23 ENCOUNTER — Telehealth: Payer: Self-pay

## 2020-04-23 NOTE — Chronic Care Management (AMB) (Signed)
Chronic Care Management Pharmacy Assistant   Name: Wayne [name]  MRN: [id number] DOB: 24-Aug-1945  Reason for Encounter: Medication Review    PCP : Rochel Brome, MD  Allergies:   Allergies  Allergen Reactions   Gabapentin Other (See Comments)    Adverse reaction   Sertraline Other (See Comments)    Adverse reaction   Metformin Diarrhea and Nausea Only   Pineapple Hives   Pravastatin Other (See Comments)   Lorazepam Other (See Comments)    Shakiness    Medications: Outpatient Encounter Medications as of 04/23/2020  Medication Sig   acetaminophen (TYLENOL) 500 MG tablet Take 1 tablet (500 mg total) by mouth every 6 (six) hours as needed.   albuterol (PROVENTIL) (2.5 MG/3ML) 0.083% nebulizer solution Inhale into the lungs.    albuterol (VENTOLIN HFA) 108 (90 Base) MCG/ACT inhaler Inhale 2 puffs into the lungs every 6 (six) hours as needed for wheezing or shortness of breath.   amLODipine (NORVASC) 5 MG tablet TAKE 1 TABLET BY MOUTH  DAILY   amoxicillin (AMOXIL) 250 MG capsule Take 1 capsule (250 mg total) by mouth 2 (two) times daily.   aspirin 81 MG chewable tablet Chew 81 mg by mouth daily.   Aspirin-Salicylamide-Caffeine (BC HEADACHE POWDER PO) Take 1 packet by mouth in the morning and at bedtime.   atorvastatin (LIPITOR) 40 MG tablet TAKE 1 TABLET BY MOUTH  DAILY   Blood Glucose Monitoring Suppl (ONE TOUCH ULTRA 2) w/Device KIT Use daily to check blood sugar E11.9   Budeson-Glycopyrrol-Formoterol (BREZTRI AEROSPHERE) 160-9-4.8 MCG/ACT AERO Inhale into the lungs.   buPROPion (WELLBUTRIN SR) 150 MG 12 hr tablet Take 1 tablet (150 mg total) by mouth 2 (two) times daily.   ciprofloxacin (CIPRO) 500 MG tablet Take 1 tablet (500 mg total) by mouth 2 (two) times daily.   dapagliflozin propanediol (FARXIGA) 5 MG TABS tablet Take 1 tablet (5 mg total) by mouth daily before breakfast. (Patient taking differently: Take 10 mg by mouth daily. )   DULoxetine  (CYMBALTA) 60 MG capsule TAKE 1 CAPSULE BY MOUTH  ONCE DAILY   finasteride (PROSCAR) 5 MG tablet Take 5 mg by mouth at bedtime.    fluticasone (FLONASE) 50 MCG/ACT nasal spray Place 2 sprays into both nostrils daily as needed for allergies or rhinitis.   fluticasone (FLONASE) 50 MCG/ACT nasal spray USE 2 SPRAY(S) IN EACH NOSTRIL ONCE DAILY AS NEEDED FOR ALLERGIES OR RHIITIS   glucose blood test strip 1 each by Other route as needed. Use as instructed   glucose blood test strip Use new test strip each time when checking blood sugar   ipratropium-albuterol (DUONEB) 0.5-2.5 (3) MG/3ML SOLN Take 3 mLs by nebulization every 6 (six) hours as needed.   losartan (COZAAR) 100 MG tablet TAKE 1 TABLET BY MOUTH  DAILY   Melatonin 10 MG TABS Take 5 mg by mouth at bedtime.   metoprolol succinate (TOPROL-XL) 25 MG 24 hr tablet Take 25 mg by mouth daily.    omeprazole (PRILOSEC) 40 MG capsule TAKE 1 CAPSULE BY MOUTH  DAILY   OneTouch Delica Lancets 23F MISC Use a new lancet each time when checking blood sugar   Semaglutide (RYBELSUS) 3 MG TABS Take 1 tablet by mouth daily.   sildenafil (VIAGRA) 100 MG tablet TAKE 1 TABLET BY MOUTH AS NEEDED APPROXIMATELY  1  HOUR  BEFORE  SEXUAL  ACTIVITY   tadalafil (CIALIS) 5 MG tablet Once daily for BPH.   tamsulosin (FLOMAX) 0.4  MG CAPS capsule TAKE 1 CAPSULE BY MOUTH  DAILY   tiZANidine (ZANAFLEX) 4 MG tablet Take 1 tablet (4 mg total) by mouth 3 (three) times daily. (Patient taking differently: Take 2 mg by mouth. Daily at bedtime and as needed during the day for back pain.)   traZODone (DESYREL) 50 MG tablet TAKE 2 AND 1/2 TABLETS BY  MOUTH 1 TIME AT NIGHT (Patient taking differently: Take 50 mg by mouth at bedtime. )   No facility-administered encounter medications on file as of 04/23/2020.    Current Diagnosis: Patient Active Problem List   Diagnosis Date Noted   Painful urination 04/04/2020   Encounter for smoking cessation counseling 12/07/2019    Acute thoracic back pain 12/07/2019   Thoracic aortic aneurysm without rupture ([place]) 12/07/2019   Thyroid nodule 12/07/2019   Other emphysema ([place]) 10/28/2019   Mild recurrent major depression ([place]) 10/28/2019   Lumbar back pain 10/28/2019   Overweight with body mass index (BMI) of 29 to 29.9 in adult 10/28/2019   Mixed hyperlipidemia    Hypertension    Diabetes mellitus without complication ([place])    Chronic coronary artery disease 03/06/2019   Chest pain in adult 02/03/2019   Tobacco abuse 02/03/2019   Memory loss 10/20/2016   Other headache syndrome 10/20/2016   Vertigo 10/20/2016   Abnormal brain MRI 10/20/2016   Mild cognitive impairment 10/20/2016   HYPERTENSION 05/30/2007      Follow-Up:  Patient Assistance Coordination - New application form filled out to [employer] for [employer].  Awaiting provider and patient signature to fax.   Elly Modena Owens Shark, [place] Notified  Judithann Sheen, [facility] Clinical Pharmacist Assistant [phone]

## 2020-05-01 ENCOUNTER — Ambulatory Visit: Payer: Medicare Other | Admitting: Family Medicine

## 2020-05-02 DIAGNOSIS — R310 Gross hematuria: Secondary | ICD-10-CM | POA: Diagnosis not present

## 2020-05-04 ENCOUNTER — Other Ambulatory Visit: Payer: Self-pay | Admitting: Family Medicine

## 2020-05-09 ENCOUNTER — Telehealth: Payer: Self-pay

## 2020-05-09 NOTE — Telephone Encounter (Signed)
Pt's wife called asking if his diabetic medication could have caused the kidney problems. States that is when his symptoms started. Please advise.

## 2020-05-09 NOTE — Telephone Encounter (Signed)
Please clarify but unsure which medicine he means. I thought he was off farxiga. kc

## 2020-05-09 NOTE — Telephone Encounter (Signed)
Urologist wants the patient hold  in the diabetic medication samples until he finishes the studies and medicine for his kidneys. Dr Tobie Poet said that it is fine.

## 2020-05-14 ENCOUNTER — Other Ambulatory Visit: Payer: Self-pay

## 2020-05-14 MED ORDER — FINASTERIDE 5 MG PO TABS
5.0000 mg | ORAL_TABLET | Freq: Every day | ORAL | 0 refills | Status: DC
Start: 2020-05-14 — End: 2020-05-21

## 2020-05-16 ENCOUNTER — Telehealth: Payer: Self-pay

## 2020-05-16 NOTE — Progress Notes (Unsigned)
Chronic Care Management Pharmacy Assistant   Name: Wayne [name]  MRN: [id number] DOB: 1946/03/10  Reason for Encounter: Disease State  PCP : Rochel Brome, MD  Allergies:   Allergies  Allergen Reactions  . Gabapentin Other (See Comments)    Adverse reaction  . Sertraline Other (See Comments)    Adverse reaction  . Metformin Diarrhea and Nausea Only  . Pineapple Hives  . Pravastatin Other (See Comments)  . Lorazepam Other (See Comments)    Shakiness    Medications: Outpatient Encounter Medications as of 05/16/2020  Medication Sig  . acetaminophen (TYLENOL) 500 MG tablet Take 1 tablet (500 mg total) by mouth every 6 (six) hours as needed.  Marland Kitchen albuterol (PROVENTIL) (2.5 MG/3ML) 0.083% nebulizer solution Inhale into the lungs.   Marland Kitchen albuterol (VENTOLIN HFA) 108 (90 Base) MCG/ACT inhaler Inhale 2 puffs into the lungs every 6 (six) hours as needed for wheezing or shortness of breath.  Marland Kitchen amLODipine (NORVASC) 5 MG tablet TAKE 1 TABLET BY MOUTH  DAILY  . amoxicillin (AMOXIL) 250 MG capsule Take 1 capsule (250 mg total) by mouth 2 (two) times daily.  Marland Kitchen aspirin 81 MG chewable tablet Chew 81 mg by mouth daily.  . Aspirin-Salicylamide-Caffeine (BC HEADACHE POWDER PO) Take 1 packet by mouth in the morning and at bedtime.  Marland Kitchen atorvastatin (LIPITOR) 40 MG tablet TAKE 1 TABLET BY MOUTH  DAILY  . Blood Glucose Monitoring Suppl (ONE TOUCH ULTRA 2) w/Device KIT Use daily to check blood sugar E11.9  . Budeson-Glycopyrrol-Formoterol (BREZTRI AEROSPHERE) 160-9-4.8 MCG/ACT AERO Inhale into the lungs.  Marland Kitchen buPROPion (WELLBUTRIN SR) 150 MG 12 hr tablet Take 1 tablet (150 mg total) by mouth 2 (two) times daily.  . ciprofloxacin (CIPRO) 500 MG tablet Take 1 tablet (500 mg total) by mouth 2 (two) times daily.  . dapagliflozin propanediol (FARXIGA) 5 MG TABS tablet Take 1 tablet (5 mg total) by mouth daily before breakfast. (Patient taking differently: Take 10 mg by mouth daily. )  . DULoxetine  (CYMBALTA) 60 MG capsule TAKE 1 CAPSULE BY MOUTH  ONCE DAILY  . finasteride (PROSCAR) 5 MG tablet Take 1 tablet (5 mg total) by mouth at bedtime.  . fluticasone (FLONASE) 50 MCG/ACT nasal spray Place 2 sprays into both nostrils daily as needed for allergies or rhinitis.  . fluticasone (FLONASE) 50 MCG/ACT nasal spray USE 2 SPRAY(S) IN EACH NOSTRIL ONCE DAILY AS NEEDED FOR ALLERGIES OR RHIITIS  . glucose blood test strip 1 each by Other route as needed. Use as instructed  . glucose blood test strip Use new test strip each time when checking blood sugar  . ipratropium-albuterol (DUONEB) 0.5-2.5 (3) MG/3ML SOLN Take 3 mLs by nebulization every 6 (six) hours as needed.  Marland Kitchen losartan (COZAAR) 100 MG tablet TAKE 1 TABLET BY MOUTH  DAILY  . Melatonin 10 MG TABS Take 5 mg by mouth at bedtime.  . metoprolol succinate (TOPROL-XL) 25 MG 24 hr tablet Take 25 mg by mouth daily.   Marland Kitchen omeprazole (PRILOSEC) 40 MG capsule TAKE 1 CAPSULE BY MOUTH  DAILY  . OneTouch Delica Lancets 18E MISC Use a new lancet each time when checking blood sugar  . Semaglutide (RYBELSUS) 3 MG TABS Take 1 tablet by mouth daily.  . sildenafil (VIAGRA) 100 MG tablet TAKE 1 TABLET BY MOUTH AS NEEDED APPROXIMATELY  1  HOUR  PRIOR  TO  SEXUAL  ACTIVITY  . tadalafil (CIALIS) 5 MG tablet Once daily for BPH.  . tamsulosin (  FLOMAX) 0.4 MG CAPS capsule TAKE 1 CAPSULE BY MOUTH  DAILY  . tiZANidine (ZANAFLEX) 4 MG tablet Take 1 tablet (4 mg total) by mouth 3 (three) times daily. (Patient taking differently: Take 2 mg by mouth. Daily at bedtime and as needed during the day for back pain.)  . traZODone (DESYREL) 50 MG tablet TAKE 2 AND 1/2 TABLETS BY  MOUTH 1 TIME AT NIGHT (Patient taking differently: Take 50 mg by mouth at bedtime. )   No facility-administered encounter medications on file as of 05/16/2020.    Current Diagnosis: Patient Active Problem List   Diagnosis Date Noted  . Painful urination 04/04/2020  . Encounter for smoking cessation  counseling 12/07/2019  . Acute thoracic back pain 12/07/2019  . Thoracic aortic aneurysm without rupture ([place]) 12/07/2019  . Thyroid nodule 12/07/2019  . Other emphysema ([place]) 10/28/2019  . Mild recurrent major depression ([place]) 10/28/2019  . Lumbar back pain 10/28/2019  . Overweight with body mass index (BMI) of 29 to 29.9 in adult 10/28/2019  . Mixed hyperlipidemia   . Hypertension   . Diabetes mellitus without complication ([place])   . Chronic coronary artery disease 03/06/2019  . Chest pain in adult 02/03/2019  . Tobacco abuse 02/03/2019  . Memory loss 10/20/2016  . Other headache syndrome 10/20/2016  . Vertigo 10/20/2016  . Abnormal brain MRI 10/20/2016  . Mild cognitive impairment 10/20/2016  . HYPERTENSION 05/30/2007   Recent Relevant Labs: Lab Results  Component Value Date/Time   HGBA1C 7.8 (H) 01/25/2020 11:33 AM   HGBA1C 6.6 (H) 10/19/2019 11:48 AM    Kidney Function Lab Results  Component Value Date/Time   CREATININE 0.96 01/25/2020 11:33 AM   CREATININE 0.84 10/19/2019 11:48 AM   GFRNONAA 76 01/25/2020 11:33 AM   GFRAA 88 01/25/2020 11:33 AM    . Current antihyperglycemic regimen:  o Metoprolol succinate 60m 24hr tab                   losartan (COZAAR) 100 MG tablet                  amLODipine (NORVASC) 5 MG tablet  . What recent interventions/DTPs have been made to improve glycemic control:  o None noted . Have there been any recent hospitalizations or ED visits since last visit with CPP? No . Patient {reports/denies:24182} hypoglycemic symptoms, including {Hypoglycemic Symptoms:3049003} . Patient {reports/denies:24182} hyperglycemic symptoms, including {symptoms; hyperglycemia:17903} . How often are you checking your blood sugar? {BG Testing frequency:23922} . What are your blood sugars ranging?  o Fasting: *** o Before meals: *** o After meals: *** o Bedtime: *** . During the week, how often does your blood glucose drop below 70?  {LowBGfrequency:24142} . Are you checking your feet daily/regularly?   Adherence Review: Is the patient currently on a STATIN medication? Yes Is the patient currently on ACE/ARB medication? No Does the patient have >5 day gap between last estimated fill dates? Cpp to review    JGeorgiana Shore,CDoddridgePharmacist Assistant 3[place]    Follow-Up:  Pharmacist Review

## 2020-05-20 ENCOUNTER — Telehealth: Payer: Self-pay

## 2020-05-20 NOTE — Progress Notes (Signed)
Chronic Care Management Pharmacy Assistant   Name: Nikolaos Maddocks  MRN: [id number] DOB: 20-Sep-1945  Reason for Encounter: Disease State for Diabetes  Patient Questions:  1.  Have you seen any other providers since your last visit? No    2.  Any changes in your medicines or health? Yes, per patient wife, she stated that his urologist took him off the [place] due to causing issues, he is scheduled to see the urologist again Jan. 13, 2022    PCP : Rochel Brome, MD  Allergies:   Allergies  Allergen Reactions   Gabapentin Other (See Comments)    Adverse reaction   Sertraline Other (See Comments)    Adverse reaction   Metformin Diarrhea and Nausea Only   Pineapple Hives   Pravastatin Other (See Comments)   Lorazepam Other (See Comments)    Shakiness    Medications: Outpatient Encounter Medications as of 05/20/2020  Medication Sig   acetaminophen (TYLENOL) 500 MG tablet Take 1 tablet (500 mg total) by mouth every 6 (six) hours as needed.   albuterol (PROVENTIL) (2.5 MG/3ML) 0.083% nebulizer solution Inhale into the lungs.    albuterol (VENTOLIN HFA) 108 (90 Base) MCG/ACT inhaler Inhale 2 puffs into the lungs every 6 (six) hours as needed for wheezing or shortness of breath.   amLODipine (NORVASC) 5 MG tablet TAKE 1 TABLET BY MOUTH  DAILY   amoxicillin (AMOXIL) 250 MG capsule Take 1 capsule (250 mg total) by mouth 2 (two) times daily.   aspirin 81 MG chewable tablet Chew 81 mg by mouth daily.   Aspirin-Salicylamide-Caffeine (BC HEADACHE POWDER PO) Take 1 packet by mouth in the morning and at bedtime.   atorvastatin (LIPITOR) 40 MG tablet TAKE 1 TABLET BY MOUTH  DAILY   Blood Glucose Monitoring Suppl (ONE TOUCH ULTRA 2) w/Device KIT Use daily to check blood sugar E11.9   Budeson-Glycopyrrol-Formoterol (BREZTRI AEROSPHERE) 160-9-4.8 MCG/ACT AERO Inhale into the lungs.   buPROPion (WELLBUTRIN SR) 150 MG 12 hr tablet Take 1 tablet (150 mg total) by mouth 2 (two)  times daily.   ciprofloxacin (CIPRO) 500 MG tablet Take 1 tablet (500 mg total) by mouth 2 (two) times daily.   dapagliflozin propanediol (FARXIGA) 5 MG TABS tablet Take 1 tablet (5 mg total) by mouth daily before breakfast. (Patient taking differently: Take 10 mg by mouth daily. )   DULoxetine (CYMBALTA) 60 MG capsule TAKE 1 CAPSULE BY MOUTH  ONCE DAILY   finasteride (PROSCAR) 5 MG tablet Take 1 tablet (5 mg total) by mouth at bedtime.   fluticasone (FLONASE) 50 MCG/ACT nasal spray Place 2 sprays into both nostrils daily as needed for allergies or rhinitis.   fluticasone (FLONASE) 50 MCG/ACT nasal spray USE 2 SPRAY(S) IN EACH NOSTRIL ONCE DAILY AS NEEDED FOR ALLERGIES OR RHIITIS   glucose blood test strip 1 each by Other route as needed. Use as instructed   glucose blood test strip Use new test strip each time when checking blood sugar   ipratropium-albuterol (DUONEB) 0.5-2.5 (3) MG/3ML SOLN Take 3 mLs by nebulization every 6 (six) hours as needed.   losartan (COZAAR) 100 MG tablet TAKE 1 TABLET BY MOUTH  DAILY   Melatonin 10 MG TABS Take 5 mg by mouth at bedtime.   metoprolol succinate (TOPROL-XL) 25 MG 24 hr tablet Take 25 mg by mouth daily.    omeprazole (PRILOSEC) 40 MG capsule TAKE 1 CAPSULE BY MOUTH  DAILY   OneTouch Delica Lancets 93G MISC Use a  new lancet each time when checking blood sugar   Semaglutide (RYBELSUS) 3 MG TABS Take 1 tablet by mouth daily.   sildenafil (VIAGRA) 100 MG tablet TAKE 1 TABLET BY MOUTH AS NEEDED APPROXIMATELY  1  HOUR  PRIOR  TO  SEXUAL  ACTIVITY   tadalafil (CIALIS) 5 MG tablet Once daily for BPH.   tamsulosin (FLOMAX) 0.4 MG CAPS capsule TAKE 1 CAPSULE BY MOUTH  DAILY   tiZANidine (ZANAFLEX) 4 MG tablet Take 1 tablet (4 mg total) by mouth 3 (three) times daily. (Patient taking differently: Take 2 mg by mouth. Daily at bedtime and as needed during the day for back pain.)   traZODone (DESYREL) 50 MG tablet TAKE 2 AND 1/2 TABLETS BY  MOUTH 1  TIME AT NIGHT (Patient taking differently: Take 50 mg by mouth at bedtime. )   No facility-administered encounter medications on file as of 05/20/2020.    Current Diagnosis: Patient Active Problem List   Diagnosis Date Noted   Painful urination 04/04/2020   Encounter for smoking cessation counseling 12/07/2019   Acute thoracic back pain 12/07/2019   Thoracic aortic aneurysm without rupture ([facility]) 12/07/2019   Thyroid nodule 12/07/2019   Other emphysema ([facility]) 10/28/2019   Mild recurrent major depression ([facility]) 10/28/2019   Lumbar back pain 10/28/2019   Overweight with body mass index (BMI) of 29 to 29.9 in adult 10/28/2019   Mixed hyperlipidemia    Hypertension    Diabetes mellitus without complication ([facility])    Chronic coronary artery disease 03/06/2019   Chest pain in adult 02/03/2019   Tobacco abuse 02/03/2019   Memory loss 10/20/2016   Other headache syndrome 10/20/2016   Vertigo 10/20/2016   Abnormal brain MRI 10/20/2016   Mild cognitive impairment 10/20/2016   HYPERTENSION 05/30/2007    Recent Relevant Labs: Lab Results  Component Value Date/Time   HGBA1C 7.8 (H) 01/25/2020 11:33 AM   HGBA1C 6.6 (H) 10/19/2019 11:48 AM    Kidney Function Lab Results  Component Value Date/Time   CREATININE 0.96 01/25/2020 11:33 AM   CREATININE 0.84 10/19/2019 11:48 AM   GFRNONAA 76 01/25/2020 11:33 AM   GFRAA 88 01/25/2020 11:33 AM     Current antihyperglycemic regimen:   Per patient wife, he is not currently on any diabetic medications, she stated his urologist took him off the [place] due to having issues, but said she had to pick up some sample meds at the office, but wasn't sure what they were.  Per patient wife he returns to the Urologist on Jan. 13, 2022     What recent interventions/DTPs have been made to improve glycemic control:  Farxiga, Metformin   Have there been any recent hospitalizations or ED visits since last visit with CPP? No      Patient denies hypoglycemic symptoms.    Patient reports hyperglycemic symptoms, including excessive thirst and fatigue     How often are you checking your blood sugar? once daily     What are your blood sugars ranging?  o Fasting: Per patient wife, he is ranging between 190 to 287 in the morning before breakfast. o Before meals: None o After meals: None o Bedtime: None o   During the week, how often does your blood glucose drop below 70? Never    Are you checking your feet daily/regularly?  Per patient wife, she stated he is not checking his feet, he does have daily neuropathy symptoms of stinging and burning that are not well managed on his current  medication regimen.   Adherence Review: Is the patient currently on a STATIN medication? Yes   Is the patient currently on ACE/ARB medication? No   Does the patient have >5 day gap between last estimated fill dates? No   Follow-Up:  Pharmacist Review  Donette Larry, CPP   Notified  Clarita Leber, [place] Pharmacist Assistant [phone]

## 2020-05-21 ENCOUNTER — Other Ambulatory Visit: Payer: Self-pay

## 2020-05-21 MED ORDER — FINASTERIDE 5 MG PO TABS: 5.0000 mg | ORAL_TABLET | Freq: Every day | ORAL | 2 refills | Status: AC

## 2020-05-22 ENCOUNTER — Other Ambulatory Visit: Payer: Self-pay

## 2020-05-22 MED ORDER — ALBUTEROL SULFATE HFA 108 (90 BASE) MCG/ACT IN AERS: 2.0000 | INHALATION_SPRAY | Freq: Four times a day (QID) | RESPIRATORY_TRACT | 6 refills | Status: DC | PRN

## 2020-05-23 ENCOUNTER — Telehealth: Payer: Medicare Other

## 2020-05-23 ENCOUNTER — Other Ambulatory Visit: Payer: Self-pay

## 2020-05-23 NOTE — Chronic Care Management (AMB) (Deleted)
Chronic Care Management Pharmacy  Name: Wayne [name]  MRN: [id number] DOB: 02/06/1946  Chief Complaint/ HPI  Wayne [name],  75 y.o. , male presents for their Follow-Up CCM visit with the clinical pharmacist via telephone due to COVID-19 Pandemic.  PCP : Rochel Brome, MD  Their chronic conditions include: hypertension, emphysema, diabetes, memory loss, vertigo, hyperlipidemia, depression, lumbar back pain.   Office Visits: 04/04/2020 - referral to urology. Stop taking [place].  Consult Visit: 04/17/2020 - Urology - keflex and diflucan. Send urine culture. CT ordered. May consider biopsy of prostate after infection.  Medications: Outpatient Encounter Medications as of 05/23/2020  Medication Sig  . acetaminophen (TYLENOL) 500 MG tablet Take 1 tablet (500 mg total) by mouth every 6 (six) hours as needed.  Marland Kitchen albuterol (PROVENTIL) (2.5 MG/3ML) 0.083% nebulizer solution Inhale into the lungs.   Marland Kitchen albuterol (VENTOLIN HFA) 108 (90 Base) MCG/ACT inhaler Inhale 2 puffs into the lungs every 6 (six) hours as needed for wheezing or shortness of breath.  Marland Kitchen amLODipine (NORVASC) 5 MG tablet TAKE 1 TABLET BY MOUTH  DAILY  . amoxicillin (AMOXIL) 250 MG capsule Take 1 capsule (250 mg total) by mouth 2 (two) times daily.  Marland Kitchen aspirin 81 MG chewable tablet Chew 81 mg by mouth daily.  . Aspirin-Salicylamide-Caffeine (BC HEADACHE POWDER PO) Take 1 packet by mouth in the morning and at bedtime.  Marland Kitchen atorvastatin (LIPITOR) 40 MG tablet TAKE 1 TABLET BY MOUTH  DAILY  . Blood Glucose Monitoring Suppl (ONE TOUCH ULTRA 2) w/Device KIT Use daily to check blood sugar E11.9  . Budeson-Glycopyrrol-Formoterol (BREZTRI AEROSPHERE) 160-9-4.8 MCG/ACT AERO Inhale into the lungs.  Marland Kitchen buPROPion (WELLBUTRIN [name]) 150 MG 12 hr tablet Take 1 tablet (150 mg total) by mouth 2 (two) times daily.  . ciprofloxacin (CIPRO) 500 MG tablet Take 1 tablet (500 mg total) by mouth 2 (two) times daily.  . dapagliflozin propanediol  (FARXIGA) 5 MG TABS tablet Take 1 tablet (5 mg total) by mouth daily before breakfast. (Patient taking differently: Take 10 mg by mouth daily. )  . DULoxetine (CYMBALTA) 60 MG capsule TAKE 1 CAPSULE BY MOUTH  ONCE DAILY  . finasteride (PROSCAR) 5 MG tablet Take 1 tablet (5 mg total) by mouth at bedtime.  . fluticasone (FLONASE) 50 MCG/ACT nasal spray Place 2 sprays into both nostrils daily as needed for allergies or rhinitis.  . fluticasone (FLONASE) 50 MCG/ACT nasal spray USE 2 SPRAY(S) IN EACH NOSTRIL ONCE DAILY AS NEEDED FOR ALLERGIES OR RHIITIS  . glucose blood test strip 1 each by Other route as needed. Use as instructed  . glucose blood test strip Use new test strip each time when checking blood sugar  . ipratropium-albuterol (DUONEB) 0.5-2.5 (3) MG/3ML SOLN Take 3 mLs by nebulization every 6 (six) hours as needed.  Marland Kitchen losartan (COZAAR) 100 MG tablet TAKE 1 TABLET BY MOUTH  DAILY  . Melatonin 10 MG TABS Take 5 mg by mouth at bedtime.  . metoprolol succinate (TOPROL-XL) 25 MG 24 hr tablet Take 25 mg by mouth daily.   Marland Kitchen omeprazole (PRILOSEC) 40 MG capsule TAKE 1 CAPSULE BY MOUTH  DAILY  . OneTouch Delica Lancets 78G MISC Use a new lancet each time when checking blood sugar  . Semaglutide (RYBELSUS) 3 MG TABS Take 1 tablet by mouth daily.  . sildenafil (VIAGRA) 100 MG tablet TAKE 1 TABLET BY MOUTH AS NEEDED APPROXIMATELY  1  HOUR  PRIOR  TO  SEXUAL  ACTIVITY  .  tadalafil (CIALIS) 5 MG tablet Once daily for BPH.  . tamsulosin (FLOMAX) 0.4 MG CAPS capsule TAKE 1 CAPSULE BY MOUTH  DAILY  . tiZANidine (ZANAFLEX) 4 MG tablet Take 1 tablet (4 mg total) by mouth 3 (three) times daily. (Patient taking differently: Take 2 mg by mouth. Daily at bedtime and as needed during the day for back pain.)  . traZODone (DESYREL) 50 MG tablet TAKE 2 AND 1/2 TABLETS BY  MOUTH 1 TIME AT NIGHT (Patient taking differently: Take 50 mg by mouth at bedtime. )   No facility-administered encounter medications on file as of  05/23/2020.   Allergies  Allergen Reactions  . Gabapentin Other (See Comments)    Adverse reaction  . Sertraline Other (See Comments)    Adverse reaction  . Metformin Diarrhea and Nausea Only  . Pineapple Hives  . Pravastatin Other (See Comments)  . Lorazepam Other (See Comments)    Shakiness   SDOH Screenings   Alcohol Screen: Not on file  Depression (PHQ2-9): Low Risk   . PHQ-2 Score: 0  Financial Resource Strain: Not on file  Food Insecurity: Not on file  Housing: Not on file  Physical Activity: Not on file  Social Connections: Not on file  Stress: Not on file  Tobacco Use: High Risk  . Smoking Tobacco Use: Current Every Day Smoker  . Smokeless Tobacco Use: Never Used  Transportation Needs: Not on file     Current Diagnosis/Assessment:  Goals Addressed   None     COPD / Asthma / Tobacco   Eosinophil count:   Lab Results  Component Value Date/Time   EOSPCT 2 05/13/2017 03:52 PM  %                               Eos (Absolute):  Lab Results  Component Value Date/Time   EOSABS 0.2 01/25/2020 11:33 AM    Tobacco Status:  Social History   Tobacco Use  Smoking Status Current Every Day Smoker  . Packs/day: 2.00  . Types: Cigarettes  Smokeless Tobacco Never Used    Patient has failed these meds in past: Trelegy Patient is currently uncontrolled on the following medications:   Duoneb nebulizer solution prn   Breztri - 2 puffs twice daily  Albuterol prn rescue inhaler Using maintenance inhaler regularly? No Frequency of rescue inhaler use:  never  We discussed:  proper inhaler technique Patient reports exercise intolerance. Patient states that he produces a whitish phlegm after using Breztri inhaler. Pharmacist will discuss with Dr. Tobie Poet. Patient states this is occurring daily and notices more after using inhaler. Denies fever, chills or discolored sputum. Denies symptoms of indigestion or acid reflux as well.   Plan  Continue current medications.  ,    Tobacco Abuse   Tobacco Status:  Social History   Tobacco Use  Smoking Status Current Every Day Smoker  . Packs/day: 2.00  . Types: Cigarettes  Smokeless Tobacco Never Used    Patient has failed these meds in past: n/a Patient is currently uncontrolled on the following medications:  . Bupropion [name] 150 mg bid - not taking currently  We discussed:  Provided contact information for [place] [facility] (1-800-QUIT-NOW) and encouraged patient to reach out to this group for support.  Patient not wanting to take bupropion at this time. Wants to get everything else straightened out before committing to quit.   Plan  Recommend patient call [place] [facility] for  smoking cessation support and begin bupropion prn. .    Diabetes   Recent Relevant Labs: Lab Results  Component Value Date/Time   HGBA1C 7.8 (H) 01/25/2020 11:33 AM   HGBA1C 6.6 (H) 10/19/2019 11:48 AM    Kidney Function Lab Results  Component Value Date/Time   CREATININE 0.96 01/25/2020 11:33 AM   CREATININE 0.84 10/19/2019 11:48 AM   GFRNONAA 76 01/25/2020 11:33 AM   GFRAA 88 01/25/2020 11:33 AM   K 4.0 01/25/2020 11:33 AM   K 4.0 10/19/2019 11:48 AM   Checking BG: daily AM fasting: >200 mg/dL  Patient has failed these meds in past: metformin, farxiga, rybelsus Patient is currently controlled on the following medications:   ***  Last diabetic Foot exam: 09/18/2019 Last diabetic Eye exam: uncertain - record not available in chart  We discussed: diet and exercise extensively. Patient's blood sugar >200 mg/dL recently. He feels lightheaded after taking [place].  We discussed taking it in the evening to avoid helping improve symptoms of this. Patient's blood sugar is elevated with 5 mg of Farxiga. Pharmacist will discuss increase to 10 mg with Dr. Tobie Poet. Patient receiving medication through [facility].   Plan  Consider increasing Farxiga to 10 mg daily.    Hypertension   BP today is:  Unsure - not  checking  Office blood pressures are  BP Readings from Last 3 Encounters:  04/04/20 122/72  01/25/20 132/64  12/07/19 132/74   Kidney Function Lab Results  Component Value Date/Time   CREATININE 0.96 01/25/2020 11:33 AM   CREATININE 0.84 10/19/2019 11:48 AM   GFRNONAA 76 01/25/2020 11:33 AM   GFRAA 88 01/25/2020 11:33 AM   K 4.0 01/25/2020 11:33 AM   K 4.0 10/19/2019 11:48 AM     Patient has failed these meds in the past: metoprolol succinate Patient is currently controlled on the following medications:   Amlodipine 5 mg daily  Losartan 100 mg daily  Patient checks BP at home infrequently  Patient home BP readings are ranging: n/a  We discussed diet and exercise extensively. Patient is not checking bp at home but states well controlled at appointments. Still has complaints of some lightheadedness.   Plan  Continue current medications     Hyperlipidemia    Lipid Panel     Component Value Date/Time   CHOL 163 01/25/2020 1133   TRIG 135 01/25/2020 1133   HDL 36 (L) 01/25/2020 1133   LDLCALC 103 (H) 01/25/2020 1133   LDLDIRECT 170.3 07/02/2006 0807    Hepatic Function Latest Ref Rng & Units 01/25/2020 10/19/2019 07/02/2006  Total Protein 6.0 - 8.5 g/dL 7.0 6.9 6.7  Albumin 3.7 - 4.7 g/dL 4.5 4.2 3.8  AST 0 - 40 IU/L $Remov'12 15 26  'ScIbbd$ ALT 0 - 44 IU/L 13 14 36  Alk Phosphatase 48 - 121 IU/L 84 82 70  Total Bilirubin 0.0 - 1.2 mg/dL 0.5 0.5 0.8  Bilirubin, Direct 0.0 - 0.3 mg/dL - - 0.2     The 10-year ASCVD risk score Mikey Bussing DC Jr., et al., 2013) is: 50.6%   Values used to calculate the score:     Age: 43 years     Sex: Male     Is Non-Hispanic African American: No     Diabetic: Yes     Tobacco smoker: Yes     Systolic Blood Pressure: 299 mmHg     Is BP treated: Yes     HDL Cholesterol: 36 mg/dL  Total Cholesterol: 163 mg/dL   Patient has failed these meds in past: pravastatin Patient is currently uncontrolled on the following medications:  . aspirin 81 mg  daily . Atorvastatin 40 mg daily  We discussed:  diet and exercise extensively.Patient denies medication related problems with increased dose. Patient's cholesterol remains above goal for heart disease <55. Patient is willing to increase dose if Dr. Tobie Poet approves.   Plan  Consider increasing Atorvastatin 80 mg daily.    GERD   Patient has failed these meds in past: n/a Patient is currently controlled on the following medications:  . Omeprazole 40 mg daily   We discussed:  Patient had significant acid reflux symptoms in the past. States good control with omeprazole. Patient denies sour taste or burning with productive phlegm.   Plan  Continue current medications   BPH   Patient has failed these meds in past: n/a Patient is currently controlled on the following medications:  . Finasteride 5 mg daily at bedtime . Tamsulosin 0.4 mg daily  We discussed:   Patient reports that urinary symptoms are controlled. He has seen Dr. Nila Nephew previously.   Plan  Continue current medications   Neuropathy   Patient has failed these meds in past: lorazepam, nortriptyline Patient is currently controlled on the following medications:  . Duloxetine 60 mg daily  We discussed:  Patient reports worsened effects since sugar has increased. Encouraged improved blood sugar control.   Plan  Continue current medications   Insomnia   Patient has failed these meds in past: n/a Patient is currently controlled on the following medications:   Trazodone 50 mg - 2 tablets at night  Melatonin 5 mg - 2 to 3 tablets qhs  We discussed:  Sleeping better with current medications.   Plan  Continue current medications    Vaccines   Reviewed and discussed patient's vaccination history.  Encouraged patient to get COVID shot in office. Discussed the benefits. Patient is not interested at this time.   Immunization History  Administered Date(s) Administered  . Fluad Quad(high Dose 65+) 01/25/2020  .  Influenza-Unspecified 03/18/2018  . Pneumococcal Polysaccharide-23 01/25/2020    Plan  Recommended patient receive annual flu vaccine in office.   Medication Management   [facility] Uses pill box? Yes Pt endorses good compliance  We discussed: Wife fills pill box each week. Patient uses mail order which is the most cost effective option.   Plan  Continue current medication management strategy    Follow up: 3 month phone visit

## 2020-05-27 ENCOUNTER — Other Ambulatory Visit: Payer: Self-pay

## 2020-05-27 MED ORDER — TIZANIDINE HCL 4 MG PO TABS
4.0000 mg | ORAL_TABLET | Freq: Three times a day (TID) | ORAL | 0 refills | Status: DC
Start: 2020-05-27 — End: 2020-06-05

## 2020-05-30 DIAGNOSIS — R310 Gross hematuria: Secondary | ICD-10-CM | POA: Diagnosis not present

## 2020-05-30 DIAGNOSIS — D414 Neoplasm of uncertain behavior of bladder: Secondary | ICD-10-CM | POA: Diagnosis not present

## 2020-06-05 ENCOUNTER — Other Ambulatory Visit: Payer: Self-pay | Admitting: Family Medicine

## 2020-06-05 MED ORDER — TIZANIDINE HCL 4 MG PO TABS: 4.0000 mg | ORAL_TABLET | Freq: Three times a day (TID) | ORAL | 0 refills | Status: DC

## 2020-06-12 ENCOUNTER — Ambulatory Visit: Payer: Medicare Other | Admitting: Nurse Practitioner

## 2020-06-27 ENCOUNTER — Ambulatory Visit (INDEPENDENT_AMBULATORY_CARE_PROVIDER_SITE_OTHER): Payer: Medicare Other

## 2020-06-27 ENCOUNTER — Other Ambulatory Visit: Payer: Self-pay

## 2020-06-27 ENCOUNTER — Other Ambulatory Visit: Payer: Self-pay | Admitting: Family Medicine

## 2020-06-27 DIAGNOSIS — E782 Mixed hyperlipidemia: Secondary | ICD-10-CM

## 2020-06-27 DIAGNOSIS — E119 Type 2 diabetes mellitus without complications: Secondary | ICD-10-CM | POA: Diagnosis not present

## 2020-06-27 DIAGNOSIS — J438 Other emphysema: Secondary | ICD-10-CM | POA: Diagnosis not present

## 2020-06-27 NOTE — Progress Notes (Signed)
Chronic Care Management Pharmacy Note  07/09/2020 Name:  Wayne [name] MRN:  [id number] DOB:  10-Nov-1945   Plan Recommendations:   Encouraged patient to schedule labs and visit with Dr. Tobie Poet. Transferred patient to receptionist but appointment not scheduled.   Subjective: Wayne [name] is an 75 y.o. year old male who is a primary patient of Cox, Kirsten, MD.  The CCM team was consulted for assistance with disease management and care coordination needs.    Engaged with patient by telephone for follow up visit in response to provider referral for pharmacy case management and/or care coordination services.   Consent to Services:  The patient was given information about Chronic Care Management services, agreed to services, and gave verbal consent prior to initiation of services.  Please see initial visit note for detailed documentation.   Patient Care Team: Rochel Brome, MD as PCP - General (Family Medicine) Burnice Logan, [facility] as Pharmacist (Pharmacist)  Recent office visits: No recent  Recent consult visits: 05/30/2020 - urology - urine culture ordered. Antibiotics prescribed.  04/17/2020 - urology visit for UTI and urinary symptoms.   Hospital visits: None in previous 6 months  Objective:  Lab Results  Component Value Date   CREATININE 0.96 01/25/2020   BUN 11 01/25/2020   GFRNONAA 76 01/25/2020   GFRAA 88 01/25/2020   NA 138 01/25/2020   K 4.0 01/25/2020   CALCIUM 9.5 01/25/2020   CO2 25 01/25/2020    Lab Results  Component Value Date/Time   HGBA1C 7.8 (H) 01/25/2020 11:33 AM   HGBA1C 6.6 (H) 10/19/2019 11:48 AM    Last diabetic Eye exam: No results found for: HMDIABEYEEXA  Last diabetic Foot exam: No results found for: HMDIABFOOTEX   Lab Results  Component Value Date   CHOL 163 01/25/2020   HDL 36 (L) 01/25/2020   LDLCALC 103 (H) 01/25/2020   LDLDIRECT 170.3 07/02/2006   TRIG 135 01/25/2020   CHOLHDL 4.5 01/25/2020    Hepatic Function  Latest Ref Rng & Units 01/25/2020 10/19/2019 07/02/2006  Total Protein 6.0 - 8.5 g/dL 7.0 6.9 6.7  Albumin 3.7 - 4.7 g/dL 4.5 4.2 3.8  AST 0 - 40 IU/L $Remov'12 15 26  'ezCflz$ ALT 0 - 44 IU/L 13 14 36  Alk Phosphatase 48 - 121 IU/L 84 82 70  Total Bilirubin 0.0 - 1.2 mg/dL 0.5 0.5 0.8  Bilirubin, Direct 0.0 - 0.3 mg/dL - - 0.2    Lab Results  Component Value Date/Time   TSH 2.540 10/20/2016 03:26 PM   TSH 1.64 07/02/2006 08:07 AM    CBC Latest Ref Rng & Units 01/25/2020 10/19/2019 05/13/2017  WBC 3.4 - 10.8 x10E3/uL 8.4 8.4 7.3  Hemoglobin 13.0 - 17.7 g/dL 14.8 15.6 17.1(H)  Hematocrit 37.5 - 51.0 % 44.0 46.3 48.6  Platelets 150 - 450 x10E3/uL 288 293 219    No results found for: VD25OH  Clinical ASCVD: Yes  The 10-year ASCVD risk score Mikey Bussing DC Jr., et al., 2013) is: 50.6%   Values used to calculate the score:     Age: 59 years     Sex: Male     Is Non-Hispanic African American: No     Diabetic: Yes     Tobacco smoker: Yes     Systolic Blood Pressure: 623 mmHg     Is BP treated: Yes     HDL Cholesterol: 36 mg/dL     Total Cholesterol: 163 mg/dL    Depression screen [facility] 2/9  01/28/2020 10/19/2019  Decreased Interest 0 0  Down, Depressed, Hopeless 0 0  PHQ - 2 Score 0 0     Social History   Tobacco Use  Smoking Status Current Every Day Smoker  . Packs/day: 2.00  . Types: Cigarettes  Smokeless Tobacco Never Used   BP Readings from Last 3 Encounters:  04/04/20 122/72  01/25/20 132/64  12/07/19 132/74   Pulse Readings from Last 3 Encounters:  04/04/20 90  01/25/20 78  12/07/19 87   Wt Readings from Last 3 Encounters:  04/04/20 189 lb 6.4 oz (85.9 kg)  01/25/20 194 lb 9.6 oz (88.3 kg)  12/07/19 190 lb 6.4 oz (86.4 kg)    Assessment/Interventions: Review of patient past medical history, allergies, medications, health status, including review of consultants reports, laboratory and other test data, was performed as part of comprehensive evaluation and provision of chronic care  management services.   SDOH:  (Social Determinants of Health) assessments and interventions performed: Yes   CCM Care Plan  Allergies  Allergen Reactions  . Gabapentin Other (See Comments)    Adverse reaction  . Sertraline Other (See Comments)    Adverse reaction  . Metformin Diarrhea and Nausea Only  . Pineapple Hives  . Pravastatin Other (See Comments)  . Lorazepam Other (See Comments)    Shakiness    Medications Reviewed Today    Reviewed by Burnice Logan, [facility] (Pharmacist) on 06/27/20 at 1042  Med List Status: <None>  Medication Order Taking? Sig Documenting Provider Last Dose Status Informant  acetaminophen (TYLENOL) 500 MG tablet [id number] Yes Take 1 tablet (500 mg total) by mouth every 6 (six) hours as needed. Waynetta Pean, PA-C Taking Active   albuterol (PROVENTIL) (2.5 MG/3ML) 0.083% nebulizer solution [id number] Yes Inhale into the lungs.  [provider] Taking Active   albuterol (VENTOLIN HFA) 108 (90 Base) MCG/ACT inhaler [id number] Yes Inhale into the lungs every 6 (six) hours as needed. [provider] Taking Active   amLODipine (NORVASC) 5 MG tablet [id number] Yes TAKE 1 TABLET BY MOUTH  DAILY Cox, Kirsten, MD Taking Active   amoxicillin (AMOXIL) 250 MG capsule [id number] No Take 1 capsule (250 mg total) by mouth 2 (two) times daily.  Patient not taking: Reported on 06/27/2020   Maryruth Hancock, MD Not Taking Consider Medication Status and Discontinue   aspirin 81 MG chewable tablet [id number] Yes Chew 81 mg by mouth daily. [provider] Taking Active Spouse/Significant Other  Aspirin-Salicylamide-Caffeine (BC HEADACHE POWDER PO) [id number] Yes Take 1 packet by mouth in the morning and at bedtime. [provider] Taking Active   atorvastatin (LIPITOR) 40 MG tablet [id number] Yes TAKE 1 TABLET BY MOUTH  DAILY Cox, Kirsten, MD Taking Active   Blood Glucose Monitoring Suppl (ONE TOUCH ULTRA 2) w/Device KIT [id number] Yes Use daily to check  blood sugar E11.9 Cox, Kirsten, MD Taking Active   Budeson-Glycopyrrol-Formoterol (BREZTRI AEROSPHERE) 160-9-4.8 MCG/ACT AERO [id number] Yes Inhale into the lungs. [provider] Taking Active   buPROPion [facility] [name]) 150 MG 12 hr tablet [id number] Yes Take 1 tablet (150 mg total) by mouth 2 (two) times daily. Corum, Rex Kras, MD Taking Active   dapagliflozin propanediol (FARXIGA) 5 MG TABS tablet [id number]  Take 1 tablet (5 mg total) by mouth daily before breakfast.  Patient taking differently: Take 10 mg by mouth daily.   Cox, Kirsten, MD  Consider Medication Status and Discontinue (Discontinued by provider)   DULoxetine (CYMBALTA) 60 MG capsule [id number] Yes TAKE 1  CAPSULE BY MOUTH  ONCE DAILY Cox, Kirsten, MD Taking Active   finasteride (PROSCAR) 5 MG tablet [id number] Yes Take 1 tablet (5 mg total) by mouth at bedtime. Abigail Miyamoto, MD Taking Active   fluticasone [facility]) 50 MCG/ACT nasal spray [id number] Yes Place 2 sprays into both nostrils daily as needed for allergies or rhinitis. Cox, Kirsten, MD Taking Active   glucose blood test strip [id number] Yes 1 each by Other route as needed. Use as instructed [provider] Taking Active   glucose blood test strip [id number] Yes Use new test strip each time when checking blood sugar Cox, Kirsten, MD Taking Active   ipratropium-albuterol (DUONEB) 0.5-2.5 (3) MG/3ML SOLN [id number] Yes Take 3 mLs by nebulization every 6 (six) hours as needed. [provider] Taking Active   losartan (COZAAR) 100 MG tablet [id number] Yes TAKE 1 TABLET BY MOUTH  DAILY Cox, Kirsten, MD Taking Active   Melatonin 10 MG TABS [id number] Yes Take 5 mg by mouth at bedtime. [provider] Taking Active   metoprolol succinate (TOPROL-XL) 25 MG 24 hr tablet [id number] Yes Take 25 mg by mouth daily.  [provider] Taking Active   omeprazole (PRILOSEC) 40 MG capsule [id number] Yes TAKE 1 CAPSULE BY MOUTH  DAILY Cox, Kirsten, MD  Taking Active   OneTouch Delica Lancets 33G [place] [id number] Yes Use a new lancet each time when checking blood sugar Cox, Kirsten, MD Taking Active   sildenafil (VIAGRA) 100 MG tablet [id number] No TAKE 1 TABLET BY MOUTH AS NEEDED APPROXIMATELY 1 HOUR PRIOR TO SEXUAL ACTIVITY  Patient not taking: Reported on 06/27/2020   Blane Ohara, MD Not Taking Active   tadalafil (CIALIS) 5 MG tablet [id number] No Once daily for BPH.  Patient not taking: Reported on 06/27/2020   Blane Ohara, MD Not Taking Active   tamsulosin (FLOMAX) 0.4 MG CAPS capsule [id number] Yes TAKE 1 CAPSULE BY MOUTH  DAILY Cox, Kirsten, MD Taking Active   tiZANidine (ZANAFLEX) 4 MG tablet [id number] Yes Take 1 tablet (4 mg total) by mouth 3 (three) times daily. Cox, Kirsten, MD Taking Active   traZODone (DESYREL) 50 MG tablet [id number] Yes TAKE 2 AND 1/2 TABLETS BY  MOUTH 1 TIME AT NIGHT  Patient taking differently: Take 50 mg by mouth at bedtime.   CoxFritzi Mandes, MD Taking Active           Patient Active Problem List   Diagnosis Date Noted  . Painful urination 04/04/2020  . Encounter for smoking cessation counseling 12/07/2019  . Acute thoracic back pain 12/07/2019  . Thoracic aortic aneurysm without rupture (HCC) 12/07/2019  . Thyroid nodule 12/07/2019  . Other emphysema (HCC) 10/28/2019  . Mild recurrent major depression (HCC) 10/28/2019  . Lumbar back pain 10/28/2019  . Overweight with body mass index (BMI) of 29 to 29.9 in adult 10/28/2019  . Mixed hyperlipidemia   . Hypertension   . Diabetes mellitus without complication (HCC)   . Chronic coronary artery disease 03/06/2019  . Chest pain in adult 02/03/2019  . Tobacco abuse 02/03/2019  . Memory loss 10/20/2016  . Other headache syndrome 10/20/2016  . Vertigo 10/20/2016  . Abnormal brain MRI 10/20/2016  . Mild cognitive impairment 10/20/2016  . HYPERTENSION 05/30/2007    Immunization History  Administered Date(s) Administered  . Fluad Quad(high Dose 65+)  01/25/2020  . Influenza-Unspecified 03/18/2018  . Pneumococcal Polysaccharide-23 01/25/2020    Conditions to be addressed/monitored:  Hypertension, Hyperlipidemia, Diabetes and COPD  Care Plan : CCM Pharmacy  Care Plan  Updates made by Earvin Hansen, RPH since 07/09/2020 12:00 AM    Problem: [facility] AMB "PATIENT-SPECIFIC PROBLEM"   Priority: High  Onset Date: 06/27/2020    Long-Range Goal: Disease Management   Start Date: 06/27/2020  Expected End Date: 06/27/2021  This Visit's Progress: On track  Priority: High  Note:     Current Barriers:  . Unable to independently afford treatment regimen   Pharmacist Clinical Goal(s):  Marland Kitchen Over the next 90 days, patient will achieve control of diabetes as evidenced by a1c through collaboration with PharmD and provider.    Interventions: . 1:1 collaboration with Blane Ohara, MD regarding development and update of comprehensive plan of care as evidenced by provider attestation and co-signature . Inter-disciplinary care team collaboration (see longitudinal plan of care) . Comprehensive medication review performed; medication list updated in electronic medical record  Hyperlipidemia: (LDL goal < 55) -uncontrolled -Current treatment: . Atorvastatin 40 mg daily  -Medications previously tried: none reported  -Current dietary patterns: enjoys meat, potatoes, vegetables. -Current exercise habits: patient staggering and unsteady. PT did not help in the past.  -Educated on Cholesterol goals;  Benefits of statin for ASCVD risk reduction; Importance of limiting foods high in cholesterol; Exercise goal of 150 minutes per week; -Counseled on diet and exercise extensively Recommended to continue current medication  Recommended scheduling updated fasting labs and visit with Dr. Sedalia Muta.   Diabetes (A1c goal <7%) -uncontrolled -Current medications: . Diet and lifestyle -Medications previously tried: metformin, farxiga, rybelsus  -Current home glucose  readings . fasting glucose: 123 mg/dL . post prandial glucose: none reported -Denies hypoglycemic/hyperglycemic symptoms -Current meal patterns:  . Enjoys meat and potatoes but eats variety of vegetables.  -Current exercise: limited due to balance.  -Educated onA1c and blood sugar goals; Complications of diabetes including kidney damage, retinal damage, and cardiovascular disease; Benefits of routine self-monitoring of blood sugar; -Counseled to check feet daily and get yearly eye exams -Counseled on diet and exercise extensively  Tobacco use (Goal reduce/discontinue smoking ) -uncontrolled -Current treatment  . None  -Patient smokes Within 30 minutes of waking -Patient triggers include: craving the taste of a cigarette -On a scale of 1-10, reports MOTIVATION to quit is 0 -On a scale of 1-10, reports CONFIDENCE in quitting is 0 -Previous quit attempts: has tried wellbutrin in the past. -Provided contact information for [employer] [facility]uit Line (1-800-QUIT-NOW) and encouraged patient to reach out to this group for support. -Offered patietn support once ready to stop smoking.   Patient Goals/Self-Care Activities . Over the next 90 days, patient will:  - take medications as prescribed check glucose daily, document, and provide at future appointments  Follow Up Plan: Telephone follow up appointment with care management team member scheduled for: 11/26/2020      Medication Assistance: Breztri[place]ained through [place] and [place] medication assistance program.  Enrollment ends 05/17/2021  Patient's preferred pharmacy is:  [facility] [address] [place] - [address] [facility] [place] [address] Phone: [phone] Fax: [phone]  [facility] [place], [place] - [address] [place], [address] [place], [address] [place] [address] Phone: [phone] Fax: [phone]  Uses pill box? Yes Pt endorses good compliance  We discussed: Current  pharmacy is preferred with insurance plan and patient is satisfied with pharmacy services Patient decided to: Continue current medication management strategy  Care Plan and Follow Up Patient Decision:  Patient agrees to Care Plan and Follow-up.  Plan:  Telephone follow up appointment with care management team member scheduled for:  12/2020

## 2020-06-28 ENCOUNTER — Other Ambulatory Visit: Payer: Self-pay | Admitting: Family Medicine

## 2020-07-09 NOTE — Patient Instructions (Addendum)
Visit Information  Goals Addressed            This Visit's Progress   . Improve My Heart Health-Coronary Artery Disease       Timeframe:  Long-Range Goal Priority:  High Start Date:           06/27/2020                  Expected End Date:        06/27/21               Follow Up Date 11/26/2021    - be open to making changes - if I have chest pain, call for help - learn about small changes that will make a big difference    Why is this important?    Lifestyle changes are key to improving the blood flow to your heart. Think about the things you can change and set a goal to live healthy.   Remember, when the blood vessels to your heart start to get clogged you may not have any symptoms.   Over time, they can get worse.   Don't ignore the signs, like chest pain, and get help right away.     Notes:     . Learn More About My Health       Timeframe:  Long-Range Goal Priority:  High Start Date:                 06/27/2020            Expected End Date: 06/27/2021                       Follow Up Date 11/26/2021    - repeat what I heard to make sure I understand - bring a list of my medicines to the visit - speak up when I don't understand    Why is this important?    The best way to learn about your health and care is by talking to the doctor and nurse.   They will answer your questions and give you information in the way that you like best.    Notes:     Marland Kitchen Manage My Medicine       Timeframe:  Long-Range Goal Priority:  High Start Date:        06/27/2020                     Expected End Date:          07/10/2021             Follow Up Date 11/26/2020    - call for medicine refill 2 or 3 days before it runs out - use a pillbox to sort medicine    Why is this important?   . These steps will help you keep on track with your medicines.   Notes:       Patient Care Plan: CCM Pharmacy Care Plan    Problem Identified: [facility] AMB "PATIENT-SPECIFIC PROBLEM"    Priority: High  Onset Date: 06/27/2020    Long-Range Goal: Disease Management   Start Date: 06/27/2020  Expected End Date: 06/27/2021  This Visit's Progress: On track  Priority: High  Note:     Current Barriers:  . Unable to independently afford treatment regimen   Pharmacist Clinical Goal(s):  Marland Kitchen Over the next 90 days, patient will achieve control of diabetes as evidenced by a1c through collaboration with  PharmD and provider.    Interventions: . 1:1 collaboration with Rochel Brome, MD regarding development and update of comprehensive plan of care as evidenced by provider attestation and co-signature . Inter-disciplinary care team collaboration (see longitudinal plan of care) . Comprehensive medication review performed; medication list updated in electronic medical record  Hyperlipidemia: (LDL goal < 55) -uncontrolled -Current treatment: . Atorvastatin 40 mg daily  -Medications previously tried: none reported  -Current dietary patterns: enjoys meat, potatoes, vegetables. -Current exercise habits: patient staggering and unsteady. PT did not help in the past.  -Educated on Cholesterol goals;  Benefits of statin for ASCVD risk reduction; Importance of limiting foods high in cholesterol; Exercise goal of 150 minutes per week; -Counseled on diet and exercise extensively Recommended to continue current medication  Recommended scheduling updated fasting labs and visit with Dr. Tobie Poet.   Diabetes (A1c goal <7%) -uncontrolled -Current medications: . Diet and lifestyle -Medications previously tried: metformin, farxiga, rybelsus  -Current home glucose readings . fasting glucose: 123 mg/dL . post prandial glucose: none reported -Denies hypoglycemic/hyperglycemic symptoms -Current meal patterns:  . Enjoys meat and potatoes but eats variety of vegetables.  -Current exercise: limited due to balance.  -Educated onA1c and blood sugar goals; Complications of diabetes including kidney  damage, retinal damage, and cardiovascular disease; Benefits of routine self-monitoring of blood sugar; -Counseled to check feet daily and get yearly eye exams -Counseled on diet and exercise extensively  Tobacco use (Goal reduce/discontinue smoking ) -uncontrolled -Current treatment  . None  -Patient smokes Within 30 minutes of waking -Patient triggers include: craving the taste of a cigarette -On a scale of 1-10, reports MOTIVATION to quit is 0 -On a scale of 1-10, reports CONFIDENCE in quitting is 0 -Previous quit attempts: has tried wellbutrin in the past. -Provided contact information for [place] [facility] (1-800-QUIT-NOW) and encouraged patient to reach out to this group for support. -Offered patietn support once ready to stop smoking.   Patient Goals/Self-Care Activities . Over the next 90 days, patient will:  - take medications as prescribed check glucose daily, document, and provide at future appointments  Follow Up Plan: Telephone follow up appointment with care management team member scheduled for: 11/26/2020      The patient verbalized understanding of instructions, educational materials, and care plan provided today and declined offer to receive copy of patient instructions, educational materials, and care plan.  Telephone follow up appointment with pharmacy team member scheduled for: 11/2020  Burnice Logan, [facility]  [email or web address]/docs/default-[email or web address]-[email or web address]-the-grocery-store-[email or web address]?[email or web address]">  Carbohydrate Counting for Diabetes Mellitus, Adult Carbohydrate counting is a method of keeping track of how many carbohydrates you eat. Eating carbohydrates naturally increases the amount of sugar (glucose) in the blood. Counting how many carbohydrates you eat improves your blood glucose control, which helps you manage your diabetes. It is important to know how many carbohydrates you can safely have in each meal. This is different for every  person. A dietitian can help you make a meal plan and calculate how many carbohydrates you should have at each meal and snack. What foods contain carbohydrates? Carbohydrates are found in the following foods:  Grains, such as breads and cereals.  Dried beans and soy products.  Starchy vegetables, such as potatoes, peas, and corn.  Fruit and fruit juices.  Milk and yogurt.  Sweets and snack foods, such as cake, cookies, candy, chips, and soft drinks.   How do I count carbohydrates in foods? There are two ways to count carbohydrates in food. You can read food labels  or learn standard serving sizes of foods. You can use either of the methods or a combination of both. Using the Nutrition Facts label The Nutrition Facts list is included on the labels of almost all packaged foods and beverages in the [place] It includes:  The serving size.  Information about nutrients in each serving, including the grams (g) of carbohydrate per serving. To use the Nutrition Facts:  Decide how many servings you will have.  Multiply the number of servings by the number of carbohydrates per serving.  The resulting number is the total amount of carbohydrates that you will be having. Learning the standard serving sizes of foods When you eat carbohydrate foods that are not packaged or do not include Nutrition Facts on the label, you need to measure the servings in order to count the amount of carbohydrates.  Measure the foods that you will eat with a food scale or measuring cup, if needed.  Decide how many standard-size servings you will eat.  Multiply the number of servings by 15. For foods that contain carbohydrates, one serving equals 15 g of carbohydrates. ? For example, if you eat 2 cups or 10 oz (300 g) of strawberries, you will have eaten 2 servings and 30 g of carbohydrates (2 servings x 15 g = 30 g).  For foods that have more than one food mixed, such as soups and casseroles, you must count the  carbohydrates in each food that is included. The following list contains standard serving sizes of common carbohydrate-rich foods. Each of these servings has about 15 g of carbohydrates:  1 slice of bread.  1 six-inch (15 cm) tortilla.  ? cup or 2 oz (53 g) cooked rice or pasta.   cup or 3 oz (85 g) cooked or canned, drained and rinsed beans or lentils.   cup or 3 oz (85 g) starchy vegetable, such as peas, corn, or squash.   cup or 4 oz (120 g) hot cereal.   cup or 3 oz (85 g) boiled or mashed potatoes, or  or 3 oz (85 g) of a large baked potato.   cup or 4 fl oz (118 mL) fruit juice.  1 cup or 8 fl oz (237 mL) milk.  1 small or 4 oz (106 g) apple.   or 2 oz (63 g) of a medium banana.  1 cup or 5 oz (150 g) strawberries.  3 cups or 1 oz (24 g) popped popcorn. What is an example of carbohydrate counting? To calculate the number of carbohydrates in this sample meal, follow the steps shown below. Sample meal  3 oz (85 g) chicken breast.  ? cup or 4 oz (106 g) Emri Sample rice.   cup or 3 oz (85 g) corn.  1 cup or 8 fl oz (237 mL) milk.  1 cup or 5 oz (150 g) strawberries with sugar-free whipped topping. Carbohydrate calculation 1. Identify the foods that contain carbohydrates: ? Rice. ? Corn. ? Milk. ? Strawberries. 2. Calculate how many servings you have of each food: ? 2 servings rice. ? 1 serving corn. ? 1 serving milk. ? 1 serving strawberries. 3. Multiply each number of servings by 15 g: ? 2 servings rice x 15 g = 30 g. ? 1 serving corn x 15 g = 15 g. ? 1 serving milk x 15 g = 15 g. ? 1 serving strawberries x 15 g = 15 g. 4. Add together all of the amounts to find the total grams of  carbohydrates eaten: ? 30 g + 15 g + 15 g + 15 g = 75 g of carbohydrates total. What are tips for following this plan? Shopping  Develop a meal plan and then make a shopping list.  Buy fresh and frozen vegetables, fresh and frozen fruit, dairy, eggs, beans, lentils, and  whole grains.  Look at food labels. Choose foods that have more fiber and less sugar.  Avoid processed foods and foods with added sugars. Meal planning  Aim to have the same amount of carbohydrates at each meal and for each snack time.  Plan to have regular, balanced meals and snacks. Where to find more information  American Diabetes Association: www.diabetes.org  Centers for Disease Control and Prevention: http://www.wolf.info/ Summary  Carbohydrate counting is a method of keeping track of how many carbohydrates you eat.  Eating carbohydrates naturally increases the amount of sugar (glucose) in the blood.  Counting how many carbohydrates you eat improves your blood glucose control, which helps you manage your diabetes.  A dietitian can help you make a meal plan and calculate how many carbohydrates you should have at each meal and snack. This information is not intended to replace advice given to you by your health care provider. Make sure you discuss any questions you have with your health care provider. Document Revised: 05/04/2019 Document Reviewed: 05/05/2019 Elsevier Patient Education  2021 [employer].

## 2020-07-25 ENCOUNTER — Telehealth: Payer: Self-pay

## 2020-07-25 NOTE — Progress Notes (Signed)
Chronic Care Management Pharmacy Assistant   Name: Wayne [name]  MRN: [id number] DOB: 1945/06/26   Reason for Encounter: Disease State for diabetes  Recent office visits:  06/27/20-[facility]  Recent consult visits:  None  Hospital visits:  None in previous 6 months  Medications: Outpatient Encounter Medications as of 07/25/2020  Medication Sig   sildenafil (VIAGRA) 100 MG tablet TAKE 1 TABLET BY MOUTH AS NEEDED APPROXIMATELY 1 HOUR PRIOR TO SEXUAL ACTIVITY (Patient not taking: Reported on 06/27/2020)   acetaminophen (TYLENOL) 500 MG tablet Take 1 tablet (500 mg total) by mouth every 6 (six) hours as needed.   albuterol (PROVENTIL) (2.5 MG/3ML) 0.083% nebulizer solution Inhale into the lungs.    albuterol (VENTOLIN HFA) 108 (90 Base) MCG/ACT inhaler Inhale into the lungs every 6 (six) hours as needed.   amLODipine (NORVASC) 5 MG tablet TAKE 1 TABLET BY MOUTH  DAILY   amoxicillin (AMOXIL) 250 MG capsule Take 1 capsule (250 mg total) by mouth 2 (two) times daily. (Patient not taking: Reported on 06/27/2020)   aspirin 81 MG chewable tablet Chew 81 mg by mouth daily.   Aspirin-Salicylamide-Caffeine (BC HEADACHE POWDER PO) Take 1 packet by mouth in the morning and at bedtime.   atorvastatin (LIPITOR) 40 MG tablet TAKE 1 TABLET BY MOUTH  DAILY   Blood Glucose Monitoring Suppl (ONE TOUCH ULTRA 2) w/Device KIT Use daily to check blood sugar E11.9   Budeson-Glycopyrrol-Formoterol (BREZTRI AEROSPHERE) 160-9-4.8 MCG/ACT AERO Inhale into the lungs.   buPROPion (WELLBUTRIN SR) 150 MG 12 hr tablet Take 1 tablet (150 mg total) by mouth 2 (two) times daily.   dapagliflozin propanediol (FARXIGA) 5 MG TABS tablet Take 1 tablet (5 mg total) by mouth daily before breakfast. (Patient taking differently: Take 10 mg by mouth daily.)   DULoxetine (CYMBALTA) 60 MG capsule TAKE 1 CAPSULE BY MOUTH  ONCE DAILY   finasteride (PROSCAR) 5 MG tablet Take 1 tablet (5 mg total) by mouth at bedtime.    fluticasone (FLONASE) 50 MCG/ACT nasal spray Place 2 sprays into both nostrils daily as needed for allergies or rhinitis.   glucose blood test strip 1 each by Other route as needed. Use as instructed   glucose blood test strip Use new test strip each time when checking blood sugar   ipratropium-albuterol (DUONEB) 0.5-2.5 (3) MG/3ML SOLN Take 3 mLs by nebulization every 6 (six) hours as needed.   losartan (COZAAR) 100 MG tablet TAKE 1 TABLET BY MOUTH  DAILY   Melatonin 10 MG TABS Take 5 mg by mouth at bedtime.   metoprolol succinate (TOPROL-XL) 25 MG 24 hr tablet Take 25 mg by mouth daily.    omeprazole (PRILOSEC) 40 MG capsule TAKE 1 CAPSULE BY MOUTH  DAILY   OneTouch Delica Lancets 94W MISC Use a new lancet each time when checking blood sugar   tadalafil (CIALIS) 5 MG tablet Once daily for BPH. (Patient not taking: Reported on 06/27/2020)   tamsulosin (FLOMAX) 0.4 MG CAPS capsule TAKE 1 CAPSULE BY MOUTH  DAILY   tiZANidine (ZANAFLEX) 4 MG tablet Take 1 tablet (4 mg total) by mouth 3 (three) times daily.   traZODone (DESYREL) 50 MG tablet TAKE 2 AND 1/2 TABLETS BY  MOUTH 1 TIME AT NIGHT (Patient taking differently: Take 50 mg by mouth at bedtime.)   No facility-administered encounter medications on file as of 07/25/2020.    Recent Relevant Labs: Lab Results  Component Value Date/Time   HGBA1C 7.8 (H) 01/25/2020 11:33 AM  HGBA1C 6.6 (H) 10/19/2019 11:48 AM    Kidney Function Lab Results  Component Value Date/Time   CREATININE 0.96 01/25/2020 11:33 AM   CREATININE 0.84 10/19/2019 11:48 AM   GFRNONAA 76 01/25/2020 11:33 AM   GFRAA 88 01/25/2020 11:33 AM     Current antihyperglycemic regimen:  Diet/exercise   What recent interventions/DTPs have been made to improve glycemic control:  Patient had me speak with his wife, she stated he is not checking his blood sugar levels often, he is not watching his diet, and has limited mobility due to balance issues.   Have  there been any recent hospitalizations or ED visits since last visit with [facility]PP? No     Patient denies hypoglycemic symptoms, including None    Patient reports hyperglycemic symptoms, including blurry vision and weakness and headaches.   How often are you checking your blood sugar? Per patients wife he is not checking his glucose levels on a regular basis.   What are your blood sugars ranging? When patient takes his blood sugars they are between 164-174, today after a honeybun it was 314.   During the week, how often does your blood glucose drop below 70? Never    Are you checking your feet daily/regularly? Patient is checking his feet, he is having some numbness and they get cold to the touch.  I encouraged the patients wife to call and make a follow up appointment with Dr. Tobie Poet, since the patient is having some issues with his feet and his blood sugars are elevated, and he is having headaches and dizziness.   Adherence Review: Is the patient currently on a STATIN medication? Yes Is the patient currently on ACE/ARB medication? Yes Does the patient have >5 day gap between last estimated fill dates? No  Clarita Leber, [place] Pharmacist Assistant [phone]

## 2020-08-06 ENCOUNTER — Telehealth: Payer: Self-pay

## 2020-08-06 ENCOUNTER — Other Ambulatory Visit: Payer: Self-pay | Admitting: Family Medicine

## 2020-08-06 NOTE — Telephone Encounter (Signed)
Pharmacist coordinated with front office manager to help coordinate patient scheduling follow-up visit to address chronic conditions.   Sherre Poot, PharmD, [facility] Clinical Pharmacist [facility] [phone] (office) [phone] (mobile)

## 2020-08-06 NOTE — Telephone Encounter (Signed)
I spoke with [name] this morning regarding [name]. Pt is overdue for his chronic fasting f.up. [name] stated that [name] is not home and will have to speak with [name] about getting an apt scheduled.

## 2020-08-13 ENCOUNTER — Encounter: Payer: Self-pay | Admitting: Nurse Practitioner

## 2020-08-13 ENCOUNTER — Other Ambulatory Visit: Payer: Self-pay

## 2020-08-13 ENCOUNTER — Ambulatory Visit (INDEPENDENT_AMBULATORY_CARE_PROVIDER_SITE_OTHER): Payer: Medicare Other | Admitting: Nurse Practitioner

## 2020-08-13 VITALS — BP 130/78 | HR 62 | Temp 97.4°F | Ht 70.0 in | Wt 185.0 lb

## 2020-08-13 DIAGNOSIS — I712 Thoracic aortic aneurysm, without rupture, unspecified: Secondary | ICD-10-CM

## 2020-08-13 DIAGNOSIS — E119 Type 2 diabetes mellitus without complications: Secondary | ICD-10-CM | POA: Diagnosis not present

## 2020-08-13 DIAGNOSIS — Z125 Encounter for screening for malignant neoplasm of prostate: Secondary | ICD-10-CM

## 2020-08-13 DIAGNOSIS — E1140 Type 2 diabetes mellitus with diabetic neuropathy, unspecified: Secondary | ICD-10-CM

## 2020-08-13 DIAGNOSIS — F330 Major depressive disorder, recurrent, mild: Secondary | ICD-10-CM

## 2020-08-13 DIAGNOSIS — E782 Mixed hyperlipidemia: Secondary | ICD-10-CM

## 2020-08-13 DIAGNOSIS — I10 Essential (primary) hypertension: Secondary | ICD-10-CM | POA: Diagnosis not present

## 2020-08-13 DIAGNOSIS — F17218 Nicotine dependence, cigarettes, with other nicotine-induced disorders: Secondary | ICD-10-CM | POA: Diagnosis not present

## 2020-08-13 DIAGNOSIS — K439 Ventral hernia without obstruction or gangrene: Secondary | ICD-10-CM | POA: Diagnosis not present

## 2020-08-13 DIAGNOSIS — R634 Abnormal weight loss: Secondary | ICD-10-CM

## 2020-08-13 LAB — POCT UA - MICROALBUMIN
Creatinine, POC: 200 mg/dL
Microalbumin Ur, POC: 100 mg/L

## 2020-08-13 NOTE — Patient Instructions (Addendum)
We will call you with lab results and make a decision on medications for diabetes and neuropathy We will reschedule your MRI for thoracic aneurysm Continue all medications Follow-up in 3 months, fasting  Managing the Challenge of Quitting Smoking Quitting smoking is a physical and mental challenge. You will face cravings, withdrawal symptoms, and temptation. Before quitting, work with your health care provider to make a plan that can help you manage quitting. Preparation can help you quit and keep you from giving in. How to manage lifestyle changes Managing stress Stress can make you want to smoke, and wanting to smoke may cause stress. It is important to find ways to manage your stress. You might try some of the following:  Practice relaxation techniques. ? Breathe slowly and deeply, in through your nose and out through your mouth. ? Listen to music. ? Soak in a bath or take a shower. ? Imagine a peaceful place or vacation.  Get some support. ? Talk with family or friends about your stress. ? Join a support group. ? Talk with a counselor or therapist.  Get some physical activity. ? Go for a walk, run, or bike ride. ? Play a favorite sport. ? Practice yoga.   Medicines Talk with your health care provider about medicines that might help you deal with cravings and make quitting easier for you. Relationships Social situations can be difficult when you are quitting smoking. To manage this, you can:  Avoid parties and other social situations where people might be smoking.  Avoid alcohol.  Leave right away if you have the urge to smoke.  Explain to your family and friends that you are quitting smoking. Ask for support and let them know you might be a bit grumpy.  Plan activities where smoking is not an option. General instructions Be aware that many people gain weight after they quit smoking. However, not everyone does. To keep from gaining weight, have a plan in place before you  quit and stick to the plan after you quit. Your plan should include:  Having healthy snacks. When you have a craving, it may help to: ? Eat popcorn, carrots, celery, or other cut vegetables. ? Chew sugar-free gum.  Changing how you eat. ? Eat small portion sizes at meals. ? Eat 4-6 small meals throughout the day instead of 1-2 large meals a day. ? Be mindful when you eat. Do not watch television or do other things that might distract you as you eat.  Exercising regularly. ? Make time to exercise each day. If you do not have time for a long workout, do short bouts of exercise for 5-10 minutes several times a day. ? Do some form of strengthening exercise, such as weight lifting. ? Do some exercise that gets your heart beating and causes you to breathe deeply, such as walking fast, running, swimming, or biking. This is very important.  Drinking plenty of water or other low-calorie or no-calorie drinks. Drink 6-8 glasses of water daily.   How to recognize withdrawal symptoms Your body and mind may experience discomfort as you try to get used to not having nicotine in your system. These effects are called withdrawal symptoms. They may include:  Feeling hungrier than normal.  Having trouble concentrating.  Feeling irritable or restless.  Having trouble sleeping.  Feeling depressed.  Craving a cigarette. To manage withdrawal symptoms:  Avoid places, people, and activities that trigger your cravings.  Remember why you want to quit.  Get plenty of sleep.  Avoid coffee and other caffeinated drinks. These may worsen some of your symptoms. These symptoms may surprise you. But be assured that they are normal to have when quitting smoking. How to manage cravings Come up with a plan for how to deal with your cravings. The plan should include the following:  A definition of the specific situation you want to deal with.  An alternative action you will take.  A clear idea for how this  action will help.  The name of someone who might help you with this. Cravings usually last for 5-10 minutes. Consider taking the following actions to help you with your plan to deal with cravings:  Keep your mouth busy. ? Chew sugar-free gum. ? Suck on hard candies or a straw. ? Brush your teeth.  Keep your hands and body busy. ? Change to a different activity right away. ? Squeeze or play with a ball. ? Do an activity or a hobby, such as making bead jewelry, practicing needlepoint, or working with wood. ? Mix up your normal routine. ? Take a short exercise break. Go for a quick walk or run up and down stairs.  Focus on doing something kind or helpful for someone else.  Call a friend or family member to talk during a craving.  Join a support group.  Contact a quitline. Where to find support To get help or find a support group:  Call the [facility]'s Smoking Quitline: 1-800-QUIT NOW (574) 727-0800)  Visit the website of the Substance Abuse and [place]: ktimeonline.com  Text QUIT to SmokefreeTXT: 440347 Where to find more information Visit these websites to find more information on quitting smoking:  [place]: www.smokefree.gov  American Lung Association: www.lung.org  American Cancer Society: www.cancer.org  Centers for Disease Control and Prevention: http://www.wolf.info/  American Heart Association: www.heart.org Contact a health care provider if:  You want to change your plan for quitting.  The medicines you are taking are not helping.  Your eating feels out of control or you cannot sleep. Get help right away if:  You feel depressed or become very anxious. Summary  Quitting smoking is a physical and mental challenge. You will face cravings, withdrawal symptoms, and temptation to smoke again. Preparation can help you as you go through these challenges.  Try different techniques to manage stress, handle social  situations, and prevent weight gain.  You can deal with cravings by keeping your mouth busy (such as by chewing gum), keeping your hands and body busy, calling family or friends, or contacting a quitline for people who want to quit smoking.  You can deal with withdrawal symptoms by avoiding places where people smoke, getting plenty of rest, and avoiding drinks with caffeine. This information is not intended to replace advice given to you by your health care provider. Make sure you discuss any questions you have with your health care provider. Document Revised: 02/21/2019 Document Reviewed: 02/21/2019 Elsevier Patient Education  [place].  Thoracic Aortic Aneurysm  An aneurysm is a bulge in an artery. It happens when blood pushes up against a weakened or damaged artery wall. A thoracic aortic aneurysm is an aneurysm that occurs in the first part of the aorta, between the heart and the diaphragm. The aorta is the main artery of the body. It supplies blood from the heart to the rest of the body. Some aneurysms may not cause symptoms or problems. However, a thoracic aortic aneurysm can cause two serious problems:  It  can enlarge and burst (rupture).  It can cause blood to flow between the layers of the wall of the aorta through a tear (aortic dissection). Both of these problems are medical emergencies. They can cause bleeding inside the body and can be life-threatening if they are not diagnosed and treated right away. What are the causes? The exact cause of this condition is not known. What increases the risk? The following factors may make you more likely to develop this condition:  Being 51 years of age or older.  Having a family history of aneurysms.  Using tobacco.  Having any of these conditions: ? Hardening of the arteries caused by the buildup of fat and other substances in the lining of a blood vessel (arteriosclerosis). ? Inflammation of the walls of an artery  (arteritis). ? A genetic disease that weakens the body's connective tissue, such as Marfan syndrome. ? An injury or trauma to the aorta. ? High blood pressure (hypertension). ? High cholesterol. ? An infection from bacteria, such as syphilis or staphylococcus, in the wall of the aorta (infectious aortitis). What are the signs or symptoms? Symptoms of this condition vary depending on the size of the aneurysm and how fast it is growing. Most grow slowly and do not cause symptoms. When symptoms do occur, they may include:  Pain in the chest, back, sides, or abdomen. The pain may vary in intensity. Sudden, severe pain may indicate that the aneurysm has ruptured.  Hoarseness.  Cough.  Shortness of breath.  Swallowing problems.  Swelling in the face, arms, or legs.  Fever.  Unexplained weight loss. How is this diagnosed? This condition may be diagnosed with:  An ultrasound.  X-rays.  CT scan.  MRI.  A test to check the arteries for damage or blockages (angiogram). Most unruptured thoracic aortic aneurysms cause no symptoms, so they are often found during exams for other conditions. How is this treated? Treatment for this condition depends on:  The size of the aneurysm.  How fast the aneurysm is growing.  Your age.  Risk factors for rupture. Small aneurysms (2.2 inches, or 5.5 cm, or less) may be managed with:  Medicines to: ? Control blood pressure. ? Manage pain. ? Fight infection.  Regular monitoring. This may include an ultrasound or CT scan every year or every 6 months to see if the aneurysm is getting bigger. Large or fast-growing aneurysms may be treated with surgery. Follow these instructions at home: Eating and drinking  Eat a healthy diet. Your health care provider may recommend that you: ? Lower your salt (sodium) intake. In some people, too much salt can raise blood pressure and increase the risk for thoracic aortic aneurysm. ? Avoid foods that are  high in saturated fat and cholesterol, such as red meat and full-fat dairy. ? Eat a diet that is low in sugar. ? Increase your fiber intake by including whole grains, vegetables, and fruits in your diet. Eating these foods may help to lower your blood pressure.  Do not drink alcohol if your health care provider tells you not to drink.  If you drink alcohol: ? Limit how much you use to:  0-1 drink a day for women.  0-2 drinks a day for men. ? Be aware of how much alcohol is in your drink. In the [place] one drink equals one 12 oz bottle of beer (355 mL), one 5 oz glass of wine (148 mL), or one 1 oz glass of hard liquor (44 mL).  Lifestyle  Do not use any products that contain nicotine or tobacco, such as cigarettes, e-cigarettes, and chewing tobacco. If you need help quitting, ask your health care provider.  Maintain a healthy weight.  Check your blood pressure regularly. Follow your health care provider's instructions on how to keep your blood pressure within normal limits.  Have your blood sugar (glucose) level and cholesterol levels checked regularly. Follow your health care provider's instructions on how to keep levels within normal limits. Activity  Stay physically active and exercise regularly. Talk with your health care provider about how often you should exercise and ask which types of exercise are best for you.  Avoid heavy lifting and activities that take a lot of effort. Ask your health care provider what activities are safe for you.   General instructions  Take over-the-counter and prescription medicines only as told by your health care provider.  Talk with your health care provider about regular screenings to see if the aneurysm is getting bigger.  Keep all follow-up visits as told by your health care provider. This is important. Contact a health care provider if you have:  Unexplained weight loss. Get help right away if you have:  Pain in your upper back, neck, or  abdomen. This pain may move into your chest and arms.  Trouble swallowing.  A cough or hoarseness.  Shortness of breath. Summary  A thoracic aortic aneurysm is an aneurysm that occurs in the first part of the aorta, between the heart and the diaphragm.  As a thoracic aortic aneurysm becomes larger, it can burst (rupture), or blood can flow between the layers of the wall of the aorta through a tear (aorticdissection). These conditions can be life-threatening if they are not diagnosed and treated right away.  If you have a thoracic aortic aneurysm, its growth will be closely monitored. Surgical repair may be needed for larger or faster-growing aneurysms. This information is not intended to replace advice given to you by your health care provider. Make sure you discuss any questions you have with your health care provider. Document Revised: 12/21/2017 Document Reviewed: 12/22/2017 Elsevier Patient Education  [place].

## 2020-08-13 NOTE — Progress Notes (Addendum)
Established Patient Office Visit  Subjective:  Patient ID: Wayne [name], male    DOB: 1946/02/22  Age: 75 y.o. MRN: [id number]  CC: Fasting follow-up COPD, hypertension, and hyperlipidemia   HPI Wayne [name] Sr is a 75 year old Caucasian male that presents for follow-up COPD, hypertension, and hyperlipidemia. He is accompanied by his wife that helps supplement health history.   COPD, Follow up Wayne [name] has a history of COPD for several years. He is currently smoking 2 ppd since teenager. Counseling provided for smoking cessation to prolong life and decrease costs. He has declined nicotine patches for assistance with smoking cessation today. States " I want to get some things straight first before I quit smoking." He tells me that he experiences dyspnea intermittently with household tasks. He has lost 9-pounds since last visit 67-month ago. Denies acute exacerbations or hospitalizations since last visit.  He was last seen for this 6 months ago. Changes made include Breztri inhaler, Duoneb nebulizer   He reports good compliance with treatment. He is not having side effects.  he uses rescue inhaler 1 per days. He IS experiencing cough and fatigue. He is NOT experiencing weight increase. he reports breathing is Unchanged.  Hypertension,follow-up He was last seen for hypertension 6 months ago.  BP at that visit was 132/64. Management since that visit includes Metoprolol 25 mg, Norvasc 5 mg, and Losartan 100 mg  He reports good compliance with treatment. He is not having side effects.  He is following a Regular diet. He is not exercising. He does smoke.  Use of agents associated with hypertension: none.   Outside blood pressures are Symptoms: No chest pain No chest pressure  No palpitations No syncope  Yes dyspnea No orthopnea  No paroxysmal nocturnal dyspnea No lower extremity edema   Pertinent labs: Lab Results  Component Value Date   CHOL 163 01/25/2020   HDL 36  (L) 01/25/2020   LDLCALC 103 (H) 01/25/2020   LDLDIRECT 170.3 07/02/2006   TRIG 135 01/25/2020   CHOLHDL 4.5 01/25/2020   Lab Results  Component Value Date   NA 138 01/25/2020   K 4.0 01/25/2020   CREATININE 0.96 01/25/2020   GFRNONAA 76 01/25/2020   GFRAA 88 01/25/2020   GLUCOSE 206 (H) 01/25/2020     The 10-year ASCVD risk score (Mikey BussingDC Jr., et al., 2013) is: 54.6%   Lipid/Cholesterol, Follow-up  Last lipid panel Other pertinent labs  Lab Results  Component Value Date   CHOL 163 01/25/2020   HDL 36 (L) 01/25/2020   LDLCALC 103 (H) 01/25/2020   LDLDIRECT 170.3 07/02/2006   TRIG 135 01/25/2020   CHOLHDL 4.5 01/25/2020   Lab Results  Component Value Date   ALT 13 01/25/2020   AST 12 01/25/2020   PLT 288 01/25/2020   TSH 2.540 10/20/2016     He was last seen for this 6 months ago.  Management since that visit includes Lipitor 40 mg.  He reports good compliance with treatment. He is not having side effects.   Type 2 Diabetes Mellitus Wayne [name] has a history of type 2 diabetes mellitus for several years. He is not currently taking medications. States blood glucose range from 140s- 170s. He tells me that FIrancaused a fungal infection in his urinary tract. He states Metformin and Rybelsus was discontinued due to intolerance. He has declined Januvia and Insulin. Last HgbA1C 7.8 on 01/25/2020. He is experiencing neuropathy to bilateral feet. He tells me he is ambulating with a cane  because he is concerned he will fall. He states he discontinued Gabapentin due to dizziness. He states he is performing daily foot checks after showering.       Thoracic aortic aneurysm       Wayne [name] tells me he has two thoracic aortic aneurysms. He states he is overdue for imaging study to monitor aneurysms. He denies chest pain or                         discomfort related to aneurysms.        Ventral hernia       Wayne [name] tells me that he has developed a painful intermittent bulge to his right upper  abdomen.He states he can "push the bulge back in". Ventral hernia              suspected.   Past Medical History:  Diagnosis Date  . BPH (benign prostatic hyperplasia)   . COPD (chronic obstructive pulmonary disease) ([place])   . Diabetes mellitus without complication ([place])   . GERD (gastroesophageal reflux disease)   . Hypertension   . Male erectile disorder   . Meniere's disease   . Mixed hyperlipidemia   . Nontoxic single thyroid nodule   . Somnolence   . Thoracic aortic aneurysm [facility])     Past Surgical History:  Procedure Laterality Date  . CATARACT EXTRACTION Bilateral   . ELBOW SURGERY Right     Family History  Problem Relation Age of Onset  . Heart attack Mother   . Diabetes Father   . COPD Father   . Diverticulitis Brother     Social History   Socioeconomic History  . Marital status: Married    Spouse name: Not on file  . Number of children: 3  . Years of education: Not on file  . Highest education level: Not on file  Occupational History  . Not on file  Tobacco Use  . Smoking status: Current Every Day Smoker    Packs/day: 2.00    Types: Cigarettes  . Smokeless tobacco: Never Used  Substance and Sexual Activity  . Alcohol use: Yes    Comment: occasional  . Drug use: No  . Sexual activity: Not on file  Other Topics Concern  . Not on file  Social History Narrative  . Not on file    Outpatient Medications Prior to Visit  Medication Sig Dispense Refill  . acetaminophen (TYLENOL) 500 MG tablet Take 1 tablet (500 mg total) by mouth every 6 (six) hours as needed. 30 tablet 0  . albuterol (PROVENTIL) (2.5 MG/3ML) 0.083% nebulizer solution Inhale into the lungs.     Marland Kitchen albuterol (VENTOLIN HFA) 108 (90 Base) MCG/ACT inhaler Inhale into the lungs every 6 (six) hours as needed.    Marland Kitchen amLODipine (NORVASC) 5 MG tablet TAKE 1 TABLET BY MOUTH  DAILY 90 tablet 1  . amoxicillin (AMOXIL) 250 MG capsule Take 1 capsule (250 mg total) by mouth 2 (two) times daily. (Patient  not taking: Reported on 06/27/2020) 10 capsule 0  . aspirin 81 MG chewable tablet Chew 81 mg by mouth daily.    . Aspirin-Salicylamide-Caffeine (BC HEADACHE POWDER PO) Take 1 packet by mouth in the morning and at bedtime.    Marland Kitchen atorvastatin (LIPITOR) 40 MG tablet TAKE 1 TABLET BY MOUTH  DAILY 90 tablet 1  . Blood Glucose Monitoring Suppl (ONE TOUCH ULTRA 2) w/Device KIT Use daily to check blood sugar E11.9 1 kit 0  .  Budeson-Glycopyrrol-Formoterol (BREZTRI AEROSPHERE) 160-9-4.8 MCG/ACT AERO Inhale into the lungs.    Marland Kitchen buPROPion (WELLBUTRIN SR) 150 MG 12 hr tablet Take 1 tablet (150 mg total) by mouth 2 (two) times daily. 60 tablet 1  . DULoxetine (CYMBALTA) 60 MG capsule TAKE 1 CAPSULE BY MOUTH  ONCE DAILY 90 capsule 3  . finasteride (PROSCAR) 5 MG tablet Take 1 tablet (5 mg total) by mouth at bedtime. 90 tablet 2  . fluticasone (FLONASE) 50 MCG/ACT nasal spray Place 2 sprays into both nostrils daily as needed for allergies or rhinitis. 1 g 1  . glucose blood test strip 1 each by Other route as needed. Use as instructed    . glucose blood test strip Use new test strip each time when checking blood sugar 100 each 2  . ipratropium-albuterol (DUONEB) 0.5-2.5 (3) MG/3ML SOLN Take 3 mLs by nebulization every 6 (six) hours as needed.    Marland Kitchen losartan (COZAAR) 100 MG tablet TAKE 1 TABLET BY MOUTH  DAILY 90 tablet 3  . Melatonin 10 MG TABS Take 5 mg by mouth at bedtime.    . metoprolol succinate (TOPROL-XL) 25 MG 24 hr tablet Take 25 mg by mouth daily.     Marland Kitchen omeprazole (PRILOSEC) 40 MG capsule TAKE 1 CAPSULE BY MOUTH  DAILY 90 capsule 3  . OneTouch Delica Lancets 28Z MISC Use a new lancet each time when checking blood sugar 100 each 2  . sildenafil (VIAGRA) 100 MG tablet TAKE 1 TABLET BY MOUTH AS NEEDED APPROXIMATELY 1 HOUR PRIOR TO SEXUAL ACTIVITY 10 tablet 0  . tamsulosin (FLOMAX) 0.4 MG CAPS capsule TAKE 1 CAPSULE BY MOUTH  DAILY 90 capsule 1  . tiZANidine (ZANAFLEX) 4 MG tablet Take 1 tablet (4 mg total)  by mouth 3 (three) times daily. 90 tablet 0  . traZODone (DESYREL) 50 MG tablet TAKE 2 AND 1/2 TABLETS BY  MOUTH 1 TIME AT NIGHT (Patient taking differently: Take 50 mg by mouth at bedtime.) 225 tablet 3   No facility-administered medications prior to visit.    Allergies  Allergen Reactions  . Gabapentin Other (See Comments)    Adverse reaction  . Sertraline Other (See Comments)    Adverse reaction  . Metformin Diarrhea and Nausea Only  . Pineapple Hives  . Pravastatin Other (See Comments)  . Lorazepam Other (See Comments)    Shakiness    ROS Review of Systems  Constitutional: Positive for fatigue and unexpected weight change. Negative for appetite change.  HENT: Negative for congestion, ear pain, rhinorrhea, sinus pressure, sinus pain and tinnitus.   Eyes: Negative for pain.  Respiratory: Positive for cough and shortness of breath (with physical activity).   Cardiovascular: Negative for chest pain, palpitations and leg swelling.  Gastrointestinal: Positive for abdominal pain (related to possible ventral hernia). Negative for constipation, diarrhea, nausea and vomiting.  Endocrine: Negative for cold intolerance, heat intolerance, polydipsia, polyphagia and polyuria.  Genitourinary: Negative for dysuria, frequency and hematuria.  Musculoskeletal: Positive for back pain (chronic) and gait problem. Negative for arthralgias, joint swelling and myalgias.  Skin: Negative for rash.  Allergic/Immunologic: Negative for environmental allergies.  Neurological: Positive for dizziness and numbness (bilateral feet). Negative for headaches.  Hematological: Negative for adenopathy.  Psychiatric/Behavioral: Positive for sleep disturbance (insomnia). Negative for decreased concentration. The patient is not nervous/anxious.       Objective:    Physical Exam Vitals reviewed.  Constitutional:      Appearance: Normal appearance.  HENT:     Head: Normocephalic.  Right Ear: Tympanic membrane  normal.     Left Ear: Tympanic membrane normal.     Mouth/Throat:     Mouth: Mucous membranes are moist.  Cardiovascular:     Rate and Rhythm: Normal rate and regular rhythm.     Pulses: Normal pulses.          Dorsalis pedis pulses are 2+ on the right side and 2+ on the left side.       Posterior tibial pulses are 2+ on the right side and 2+ on the left side.     Heart sounds: Normal heart sounds.  Pulmonary:     Effort: Pulmonary effort is normal.     Breath sounds: Rhonchi present.  Abdominal:     General: Bowel sounds are normal.     Palpations: Abdomen is soft.     Hernia: A hernia (ventral hernia to right upper abd) is present. Hernia is present in the ventral area.    Musculoskeletal:        General: Tenderness present.     Right foot: No deformity.     Left foot: No deformity.  Feet:     Right foot:     Protective Sensation: 8 sites tested. 8 sites sensed.     Skin integrity: Skin integrity normal.     Toenail Condition: Right toenails are abnormally thick.     Left foot:     Protective Sensation: 8 sites tested. 8 sites sensed.     Skin integrity: Skin integrity normal.     Toenail Condition: Left toenails are abnormally thick.  Skin:    General: Skin is warm and dry.     Capillary Refill: Capillary refill takes less than 2 seconds.  Neurological:     General: No focal deficit present.     Mental Status: He is alert and oriented to person, place, and time.  Psychiatric:        Mood and Affect: Mood normal.        Behavior: Behavior normal.    BP 130/78 (BP Location: Left Arm, Patient Position: Sitting)   Pulse 62   Temp (!) 97.4 F (36.3 C) (Temporal)   Ht _0  (1.778 m)   Wt 185 lb (83.9 kg)   SpO2 93%   BMI 26.54 kg/m   Wt Readings from Last 3 Encounters:  04/04/20 189 lb 6.4 oz (85.9 kg)  01/25/20 194 lb 9.6 oz (88.3 kg)  12/07/19 190 lb 6.4 oz (86.4 kg)     Health Maintenance Due  Topic Date Due  . Hepatitis C Screening  Never done  .  COVID-19 Vaccine (1) Never done  . FOOT EXAM  08/13/2020  . OPHTHALMOLOGY EXAM  2020  . TETANUS/TDAP  Never done  . HEMOGLOBIN A1C  07/24/2020     Lab Results  Component Value Date   TSH 2.540 10/20/2016   Lab Results  Component Value Date   WBC 8.4 01/25/2020   HGB 14.8 01/25/2020   HCT 44.0 01/25/2020   MCV 92 01/25/2020   PLT 288 01/25/2020   Lab Results  Component Value Date   NA 138 01/25/2020   K 4.0 01/25/2020   CO2 25 01/25/2020   GLUCOSE 206 (H) 01/25/2020   BUN 11 01/25/2020   CREATININE 0.96 01/25/2020   BILITOT 0.5 01/25/2020   ALKPHOS 84 01/25/2020   AST 12 01/25/2020   ALT 13 01/25/2020   PROT 7.0 01/25/2020   ALBUMIN 4.5 01/25/2020   CALCIUM 9.5 01/25/2020  ANIONGAP 11 05/13/2017   Lab Results  Component Value Date   CHOL 163 01/25/2020   Lab Results  Component Value Date   HDL 36 (L) 01/25/2020   Lab Results  Component Value Date   LDLCALC 103 (H) 01/25/2020   Lab Results  Component Value Date   TRIG 135 01/25/2020   Lab Results  Component Value Date   CHOLHDL 4.5 01/25/2020   Lab Results  Component Value Date   HGBA1C 7.8 (H) 01/25/2020      Assessment & Plan:   1. Primary hypertension-well controlled - CBC With Diff/Platelet - Comprehensive metabolic panel -Continue Metoprolol 25 mg, Norvasc 5 mg, and Losartan 100 mg  2. Mixed hyperlipidemia-well controlled - Lipid panel -Continue Lipitor 40 mg daily  3. Mild recurrent major depression (HCC)-well controlled -Continue Wellbutrin as prescribed -PHQ-9 score 0 in office today  4. Prostate cancer screening - PSA  5. Thoracic aortic aneurysm without rupture ([place]) - MR ANGIO CHEST W WO CONTRAST  6. Ventral hernia without obstruction or gangrene -Avoid heavy lifting -Pt is a poor surgical candidate due to advanced COPD  7. Cigarette nicotine dependence with other nicotine-induced disorder -Smoking cessation counseling provided -Pt declined assistance with smoking  cessation and nicotine patches  8. Weight loss -Small frequent meals -Glucerna supplemental shakes daily  9. Type 2 diabetes mellitus with diabetic neuropathy, without long-term current use of insulin (HCC) - Hemoglobin A1c - POCT UA - Microalbumin -Low carb, heart healthy diet -Recommend Januvia 100 mg daily if pt will agree, he has refused in the past   We will call you with lab results and make a decision on medications for diabetes and neuropathy We will reschedule your MRI for thoracic aneurysm Continue all medications Follow-up in 3 months, fasting  Follow-up:  43-month  Signed, SRip Harbour NP

## 2020-08-14 ENCOUNTER — Other Ambulatory Visit: Payer: Self-pay | Admitting: Nurse Practitioner

## 2020-08-14 DIAGNOSIS — G629 Polyneuropathy, unspecified: Secondary | ICD-10-CM

## 2020-08-14 LAB — CBC WITH DIFF/PLATELET
Basophils Absolute: 0.1 10*3/uL (ref 0.0–0.2)
Basos: 1 %
EOS (ABSOLUTE): 0.2 10*3/uL (ref 0.0–0.4)
Eos: 3 %
Hematocrit: 42.5 % (ref 37.5–51.0)
Hemoglobin: 14.3 g/dL (ref 13.0–17.7)
Immature Grans (Abs): 0 10*3/uL (ref 0.0–0.1)
Immature Granulocytes: 0 %
Lymphocytes Absolute: 1.4 10*3/uL (ref 0.7–3.1)
Lymphs: 20 %
MCH: 29.9 pg (ref 26.6–33.0)
MCHC: 33.6 g/dL (ref 31.5–35.7)
MCV: 89 fL (ref 79–97)
Monocytes Absolute: 0.6 10*3/uL (ref 0.1–0.9)
Monocytes: 9 %
Neutrophils Absolute: 4.8 10*3/uL (ref 1.4–7.0)
Neutrophils: 67 %
Platelets: 272 10*3/uL (ref 150–450)
RBC: 4.78 x10E6/uL (ref 4.14–5.80)
RDW: 13.1 % (ref 11.6–15.4)
WBC: 7.1 10*3/uL (ref 3.4–10.8)

## 2020-08-14 LAB — COMPREHENSIVE METABOLIC PANEL
ALT: 14 IU/L (ref 0–44)
AST: 17 IU/L (ref 0–40)
Albumin/Globulin Ratio: 1.5 (ref 1.2–2.2)
Albumin: 4.4 g/dL (ref 3.7–4.7)
Alkaline Phosphatase: 81 IU/L (ref 44–121)
BUN/Creatinine Ratio: 11 (ref 10–24)
BUN: 10 mg/dL (ref 8–27)
Bilirubin Total: 0.6 mg/dL (ref 0.0–1.2)
CO2: 24 mmol/L (ref 20–29)
Calcium: 9.4 mg/dL (ref 8.6–10.2)
Chloride: 99 mmol/L (ref 96–106)
Creatinine, Ser: 0.9 mg/dL (ref 0.76–1.27)
Globulin, Total: 2.9 g/dL (ref 1.5–4.5)
Glucose: 146 mg/dL — ABNORMAL HIGH (ref 65–99)
Potassium: 4.1 mmol/L (ref 3.5–5.2)
Sodium: 141 mmol/L (ref 134–144)
Total Protein: 7.3 g/dL (ref 6.0–8.5)
eGFR: 89 mL/min/{1.73_m2} (ref >59)

## 2020-08-14 LAB — HEMOGLOBIN A1C
Est. average glucose Bld gHb Est-mCnc: 157 mg/dL
Hgb A1c MFr Bld: 7.1 % — ABNORMAL HIGH (ref 4.8–5.6)

## 2020-08-14 LAB — LIPID PANEL
Chol/HDL Ratio: 3.5 ratio (ref 0.0–5.0)
Cholesterol, Total: 121 mg/dL (ref 100–199)
HDL: 35 mg/dL — ABNORMAL LOW (ref >39)
LDL Chol Calc (NIH): 68 mg/dL (ref 0–99)
Triglycerides: 92 mg/dL (ref 0–149)
VLDL Cholesterol Cal: 18 mg/dL (ref 5–40)

## 2020-08-14 LAB — CARDIOVASCULAR RISK ASSESSMENT

## 2020-08-14 LAB — PSA: Prostate Specific Ag, Serum: 3.8 ng/mL (ref 0.0–4.0)

## 2020-08-16 LAB — B12 AND FOLATE PANEL
Folate: 4.7 ng/mL (ref >3.0)
Vitamin B-12: 228 pg/mL — ABNORMAL LOW (ref 232–1245)

## 2020-08-16 LAB — SPECIMEN STATUS REPORT

## 2020-08-17 ENCOUNTER — Other Ambulatory Visit: Payer: Self-pay | Admitting: Nurse Practitioner

## 2020-08-17 DIAGNOSIS — E538 Deficiency of other specified B group vitamins: Secondary | ICD-10-CM

## 2020-08-17 MED ORDER — CYANOCOBALAMIN 1000 MCG/ML IJ SOLN: 1000.0000 ug | INTRAMUSCULAR | 1 refills | Status: DC

## 2020-08-21 ENCOUNTER — Ambulatory Visit (INDEPENDENT_AMBULATORY_CARE_PROVIDER_SITE_OTHER): Payer: Medicare Other

## 2020-08-21 ENCOUNTER — Other Ambulatory Visit: Payer: Self-pay

## 2020-08-21 DIAGNOSIS — G629 Polyneuropathy, unspecified: Secondary | ICD-10-CM | POA: Diagnosis not present

## 2020-08-21 DIAGNOSIS — E538 Deficiency of other specified B group vitamins: Secondary | ICD-10-CM

## 2020-08-21 MED ORDER — CYANOCOBALAMIN 1000 MCG/ML IJ SOLN
1000.0000 ug | Freq: Once | INTRAMUSCULAR | Status: AC
  Administered 2020-08-21: 1000 ug via INTRAMUSCULAR

## 2020-08-23 ENCOUNTER — Other Ambulatory Visit: Payer: Self-pay | Admitting: Nurse Practitioner

## 2020-08-23 DIAGNOSIS — I712 Thoracic aortic aneurysm, without rupture, unspecified: Secondary | ICD-10-CM

## 2020-08-29 ENCOUNTER — Other Ambulatory Visit: Payer: Self-pay | Admitting: Family Medicine

## 2020-08-29 DIAGNOSIS — I712 Thoracic aortic aneurysm, without rupture: Secondary | ICD-10-CM | POA: Diagnosis not present

## 2020-08-29 DIAGNOSIS — I288 Other diseases of pulmonary vessels: Secondary | ICD-10-CM | POA: Diagnosis not present

## 2020-08-29 DIAGNOSIS — I728 Aneurysm of other specified arteries: Secondary | ICD-10-CM | POA: Diagnosis not present

## 2020-08-29 DIAGNOSIS — I7789 Other specified disorders of arteries and arterioles: Secondary | ICD-10-CM | POA: Diagnosis not present

## 2020-09-03 ENCOUNTER — Other Ambulatory Visit: Payer: Self-pay | Admitting: Family Medicine

## 2020-09-17 ENCOUNTER — Other Ambulatory Visit: Payer: Self-pay | Admitting: Family Medicine

## 2020-09-18 ENCOUNTER — Ambulatory Visit (INDEPENDENT_AMBULATORY_CARE_PROVIDER_SITE_OTHER): Payer: Medicare Other | Admitting: Family Medicine

## 2020-09-18 ENCOUNTER — Other Ambulatory Visit: Payer: Self-pay

## 2020-09-18 VITALS — BP 110/70 | HR 80 | Temp 97.7°F | Resp 20 | Ht 70.0 in | Wt 188.0 lb

## 2020-09-18 DIAGNOSIS — M25551 Pain in right hip: Secondary | ICD-10-CM | POA: Diagnosis not present

## 2020-09-18 DIAGNOSIS — M25561 Pain in right knee: Secondary | ICD-10-CM | POA: Diagnosis not present

## 2020-09-18 DIAGNOSIS — G8929 Other chronic pain: Secondary | ICD-10-CM

## 2020-09-18 DIAGNOSIS — M79671 Pain in right foot: Secondary | ICD-10-CM

## 2020-09-18 DIAGNOSIS — E538 Deficiency of other specified B group vitamins: Secondary | ICD-10-CM

## 2020-09-18 DIAGNOSIS — M1711 Unilateral primary osteoarthritis, right knee: Secondary | ICD-10-CM | POA: Diagnosis not present

## 2020-09-18 DIAGNOSIS — M79672 Pain in left foot: Secondary | ICD-10-CM

## 2020-09-18 MED ORDER — SITAGLIPTIN PHOSPHATE 100 MG PO TABS: 100.0000 mg | ORAL_TABLET | Freq: Every day | ORAL | 3 refills | Status: DC

## 2020-09-18 NOTE — Progress Notes (Signed)
Acute Office Visit  Subjective:    Patient ID: Wayne [name], male    DOB: [birth year], 75 y.o.   MRN: [id number]  Chief Complaint  Patient presents with  . Hip Pain    Right     HPI Patient is in today for right hip pain, right foot pain Is on top of foot and left foot hurts in medial foot and into left toe. No radicular pain. No back pain.  Diabetes: Needs januvia samples.   Past Medical History:  Diagnosis Date  . BPH (benign prostatic hyperplasia)   . COPD (chronic obstructive pulmonary disease) ([place])   . Diabetes mellitus without complication ([place])   . GERD (gastroesophageal reflux disease)   . Hypertension   . Male erectile disorder   . Meniere's disease   . Mixed hyperlipidemia   . Nontoxic single thyroid nodule   . Somnolence   . Thoracic aortic aneurysm [facility])     Past Surgical History:  Procedure Laterality Date  . CATARACT EXTRACTION Bilateral   . ELBOW SURGERY Right     Family History  Problem Relation Age of Onset  . Heart attack Mother   . Diabetes Father   . COPD Father   . Diverticulitis Brother     Social History   Socioeconomic History  . Marital status: Married    Spouse name: Not on file  . Number of children: 3  . Years of education: Not on file  . Highest education level: Not on file  Occupational History  . Not on file  Tobacco Use  . Smoking status: Current Every Day Smoker    Packs/day: 2.00    Types: Cigarettes  . Smokeless tobacco: Never Used  Substance and Sexual Activity  . Alcohol use: Yes    Comment: occasional  . Drug use: No  . Sexual activity: Not on file  Other Topics Concern  . Not on file  Social History Narrative  . Not on file   Social Determinants of Health   Financial Resource Strain: Not on file  Food Insecurity: Not on file  Transportation Needs: Not on file  Physical Activity: Not on file  Stress: Not on file  Social Connections: Not on file  Intimate Partner Violence: Not on file     Outpatient Medications Prior to Visit  Medication Sig Dispense Refill  . acetaminophen (TYLENOL) 500 MG tablet Take 1 tablet (500 mg total) by mouth every 6 (six) hours as needed. 30 tablet 0  . albuterol (PROVENTIL) (2.5 MG/3ML) 0.083% nebulizer solution Inhale into the lungs.     Marland Kitchen albuterol (VENTOLIN HFA) 108 (90 Base) MCG/ACT inhaler Inhale into the lungs every 6 (six) hours as needed.    Marland Kitchen amLODipine (NORVASC) 5 MG tablet TAKE 1 TABLET BY MOUTH  DAILY 90 tablet 1  . Aspirin-Salicylamide-Caffeine (BC HEADACHE POWDER PO) Take 1 packet by mouth in the morning and at bedtime.    Marland Kitchen atorvastatin (LIPITOR) 40 MG tablet TAKE 1 TABLET BY MOUTH  DAILY 90 tablet 1  . Blood Glucose Monitoring Suppl (ONE TOUCH ULTRA 2) w/Device KIT Use daily to check blood sugar E11.9 1 kit 0  . Budeson-Glycopyrrol-Formoterol (BREZTRI AEROSPHERE) 160-9-4.8 MCG/ACT AERO Inhale into the lungs.    Marland Kitchen buPROPion (WELLBUTRIN SR) 150 MG 12 hr tablet Take 1 tablet (150 mg total) by mouth 2 (two) times daily. 60 tablet 1  . cyanocobalamin (,VITAMIN B-12,) 1000 MCG/ML injection Inject 1 mL (1,000 mcg total) into the muscle once a  week. 4 mL 1  . DULoxetine (CYMBALTA) 60 MG capsule TAKE 1 CAPSULE BY MOUTH  ONCE DAILY 90 capsule 3  . finasteride (PROSCAR) 5 MG tablet Take 1 tablet (5 mg total) by mouth at bedtime. 90 tablet 2  . fluticasone (FLONASE) 50 MCG/ACT nasal spray USE 2 SPRAY(S) IN EACH NOSTRIL ONCE DAILY AS NEEDED FOR ALLERGIES OR RHIITIS 16 g 0  . glucose blood test strip Use new test strip each time when checking blood sugar 100 each 2  . ipratropium-albuterol (DUONEB) 0.5-2.5 (3) MG/3ML SOLN Take 3 mLs by nebulization every 6 (six) hours as needed.    Marland Kitchen losartan (COZAAR) 100 MG tablet TAKE 1 TABLET BY MOUTH  DAILY 90 tablet 3  . Melatonin 10 MG TABS Take 5 mg by mouth at bedtime.    . metoprolol succinate (TOPROL-XL) 25 MG 24 hr tablet Take 25 mg by mouth daily.     Marland Kitchen omeprazole (PRILOSEC) 40 MG capsule TAKE 1  CAPSULE BY MOUTH  DAILY 90 capsule 3  . OneTouch Delica Lancets 54Y MISC Use a new lancet each time when checking blood sugar 100 each 2  . sildenafil (VIAGRA) 100 MG tablet TAKE 1 TABLET BY MOUTH AS NEEDED APPROXIMATELY 1 HOUR PRIOR TO SEXUAL ACTIVITY 10 tablet 0  . tamsulosin (FLOMAX) 0.4 MG CAPS capsule TAKE 1 CAPSULE BY MOUTH  DAILY 90 capsule 1  . tiZANidine (ZANAFLEX) 4 MG tablet TAKE 1 TABLET BY MOUTH THREE TIMES DAILY 90 tablet 0  . amoxicillin (AMOXIL) 250 MG capsule Take 1 capsule (250 mg total) by mouth 2 (two) times daily. 10 capsule 0  . aspirin 81 MG chewable tablet Chew 81 mg by mouth daily.    . traZODone (DESYREL) 50 MG tablet TAKE 2 AND 1/2 TABLETS BY  MOUTH 1 TIME AT NIGHT (Patient taking differently: Take 50 mg by mouth at bedtime.) 225 tablet 3   No facility-administered medications prior to visit.    Allergies  Allergen Reactions  . Gabapentin Other (See Comments)    Adverse reaction  . Sertraline Other (See Comments)    Adverse reaction  . Metformin Diarrhea and Nausea Only  . Pineapple Hives  . Pravastatin Other (See Comments)  . Lorazepam Other (See Comments)    Shakiness    Review of Systems  Constitutional: Positive for fatigue. Negative for chills and fever.  HENT: Negative for congestion, ear pain and sore throat.   Respiratory: Positive for cough and shortness of breath.   Cardiovascular: Negative for chest pain.  Gastrointestinal: Negative for abdominal pain, constipation, nausea and vomiting.  Endocrine: Positive for polydipsia. Negative for polyphagia.  Genitourinary: Positive for frequency.       Poor bladder control.  Musculoskeletal: Positive for arthralgias and back pain. Negative for myalgias.  Neurological: Negative for headaches.  Psychiatric/Behavioral: Negative for dysphoric mood.       Objective:    Physical Exam Vitals reviewed.  Constitutional:      Appearance: Normal appearance.  Cardiovascular:     Rate and Rhythm: Normal  rate and regular rhythm.     Heart sounds: Normal heart sounds.  Pulmonary:     Effort: Pulmonary effort is normal.     Breath sounds: Normal breath sounds. No wheezing, rhonchi or rales.  Abdominal:     General: Bowel sounds are normal.     Palpations: Abdomen is soft.     Tenderness: There is no abdominal tenderness.  Musculoskeletal:        General: Tenderness: with  rt hip flexion. left hip flexion normal without pain. tender over left foot. no abnormalities.Marland Kitchen left foot tender mid arch into toe.   Neurological:     Mental Status: He is alert and oriented to person, place, and time.  Psychiatric:        Mood and Affect: Mood normal.        Behavior: Behavior normal.     BP 110/70   Pulse 80   Temp 97.7 F (36.5 C)   Resp 20   Ht $R'5\' 10"'HV$  (1.778 m)   Wt 188 lb (85.3 kg)   BMI 26.98 kg/m  Wt Readings from Last 3 Encounters:  09/18/20 188 lb (85.3 kg)  08/13/20 185 lb (83.9 kg)  04/04/20 189 lb 6.4 oz (85.9 kg)    Health Maintenance Due  Topic Date Due  . COVID-19 Vaccine (1) Never done  . OPHTHALMOLOGY EXAM  Never done  . Hepatitis C Screening  Never done  . TETANUS/TDAP  Never done    There are no preventive care reminders to display for this patient.   Lab Results  Component Value Date   TSH 2.540 10/20/2016   Lab Results  Component Value Date   WBC 7.1 08/13/2020   HGB 14.3 08/13/2020   HCT 42.5 08/13/2020   MCV 89 08/13/2020   PLT 272 08/13/2020   Lab Results  Component Value Date   NA 141 08/13/2020   K 4.1 08/13/2020   CO2 24 08/13/2020   GLUCOSE 146 (H) 08/13/2020   BUN 10 08/13/2020   CREATININE 0.90 08/13/2020   BILITOT 0.6 08/13/2020   ALKPHOS 81 08/13/2020   AST 17 08/13/2020   ALT 14 08/13/2020   PROT 7.3 08/13/2020   ALBUMIN 4.4 08/13/2020   CALCIUM 9.4 08/13/2020   ANIONGAP 11 05/13/2017   EGFR 89 08/13/2020   Lab Results  Component Value Date   CHOL 121 08/13/2020   Lab Results  Component Value Date   HDL 35 (L) 08/13/2020    Lab Results  Component Value Date   LDLCALC 68 08/13/2020   Lab Results  Component Value Date   TRIG 92 08/13/2020   Lab Results  Component Value Date   CHOLHDL 3.5 08/13/2020   Lab Results  Component Value Date   HGBA1C 7.1 (H) 08/13/2020       Assessment & Plan:  1. Right hip pain - DG Hip Unilat W OR W/O Pelvis 1V Right  2. Chronic pain of right knee - DG Knee Complete 4 Views Right  3. B12 deficiency - Vitamin B12  4. Pain in both feet    Meds ordered this encounter  Medications  . sitaGLIPtin (JANUVIA) 100 MG tablet    Sig: Take 1 tablet (100 mg total) by mouth daily.    Dispense:  90 tablet    Refill:  3    Orders Placed This Encounter  Procedures  . DG Knee Complete 4 Views Right  . DG Hip Unilat W OR W/O Pelvis 1V Right  . Vitamin B12     Follow-up: No follow-ups on file.  An After Visit Summary was printed and given to the patient.  Rochel Brome, MD [facility] [phone]

## 2020-09-19 LAB — VITAMIN B12: Vitamin B-12: 491 pg/mL (ref 232–1245)

## 2020-09-26 ENCOUNTER — Encounter: Payer: Self-pay | Admitting: Family Medicine

## 2020-10-17 ENCOUNTER — Ambulatory Visit (INDEPENDENT_AMBULATORY_CARE_PROVIDER_SITE_OTHER): Payer: Medicare Other | Admitting: Family Medicine

## 2020-10-17 ENCOUNTER — Other Ambulatory Visit: Payer: Self-pay

## 2020-10-17 VITALS — BP 140/78 | HR 88 | Temp 97.7°F | Resp 20 | Ht 67.0 in | Wt 189.0 lb

## 2020-10-17 DIAGNOSIS — N3001 Acute cystitis with hematuria: Secondary | ICD-10-CM | POA: Diagnosis not present

## 2020-10-17 DIAGNOSIS — E1140 Type 2 diabetes mellitus with diabetic neuropathy, unspecified: Secondary | ICD-10-CM

## 2020-10-17 LAB — POCT URINALYSIS DIPSTICK
Bilirubin, UA: NEGATIVE
Glucose, UA: POSITIVE — AB
Ketones, UA: NEGATIVE
Nitrite, UA: NEGATIVE
Protein, UA: POSITIVE — AB
Spec Grav, UA: >=1.03 — AB (ref 1.010–1.025)
Urobilinogen, UA: 1 E.U./dL
pH, UA: 6 (ref 5.0–8.0)

## 2020-10-17 MED ORDER — CEPHALEXIN 500 MG PO CAPS
500.0000 mg | ORAL_CAPSULE | Freq: Two times a day (BID) | ORAL | 0 refills | Status: DC
Start: 2020-10-17 — End: 2021-02-24

## 2020-10-17 NOTE — Progress Notes (Signed)
Acute Office Visit  Subjective:    Patient ID: Wayne [name], male    DOB: July 24, 1945, 75 y.o.   MRN: [id number]  Chief Complaint  Patient presents with   Dysuria    HPI Patient is in today for dysuria. Denies rash. Pt thinks it is farxiga, rybelsus, and/or [place]. Similar symptoms January 2022 when he was given Keflex.  Current symptoms have been going on for a week.  Denies penile discharge  Diabetes is uncontrolled.  His sugars range 116-290.  He checks them every day fasting.  Patient has held his farxiga, Rybelsus, and Januvia.  Past Medical History:  Diagnosis Date   BPH (benign prostatic hyperplasia)    COPD (chronic obstructive pulmonary disease) (HCC)    Diabetes mellitus without complication (HCC)    GERD (gastroesophageal reflux disease)    Hypertension    Male erectile disorder    Meniere's disease    Mixed hyperlipidemia    Nontoxic single thyroid nodule    Somnolence    Thoracic aortic aneurysm (HCC)     Past Surgical History:  Procedure Laterality Date   CATARACT EXTRACTION Bilateral    ELBOW SURGERY Right     Family History  Problem Relation Age of Onset   Heart attack Mother    Diabetes Father    COPD Father    Diverticulitis Brother     Social History   Socioeconomic History   Marital status: Married    Spouse name: Not on file   Number of children: 3   Years of education: Not on file   Highest education level: Not on file  Occupational History   Not on file  Tobacco Use   Smoking status: Every Day    Packs/day: 2.00    Pack years: 0.00    Types: Cigarettes   Smokeless tobacco: Never  Substance and Sexual Activity   Alcohol use: Yes    Comment: occasional   Drug use: No   Sexual activity: Not on file  Other Topics Concern   Not on file  Social History Narrative   Not on file   Social Determinants of Health   Financial Resource Strain: Not on file  Food Insecurity: Not on file  Transportation Needs: Not on file   Physical Activity: Not on file  Stress: Not on file  Social Connections: Not on file  Intimate Partner Violence: Not on file    Outpatient Medications Prior to Visit  Medication Sig Dispense Refill   acetaminophen (TYLENOL) 500 MG tablet Take 1 tablet (500 mg total) by mouth every 6 (six) hours as needed. 30 tablet 0   albuterol (PROVENTIL) (2.5 MG/3ML) 0.083% nebulizer solution Inhale into the lungs.      albuterol (VENTOLIN HFA) 108 (90 Base) MCG/ACT inhaler Inhale into the lungs every 6 (six) hours as needed.     amLODipine (NORVASC) 5 MG tablet TAKE 1 TABLET BY MOUTH  DAILY 90 tablet 1   Aspirin-Salicylamide-Caffeine (BC HEADACHE POWDER PO) Take 1 packet by mouth in the morning and at bedtime.     atorvastatin (LIPITOR) 40 MG tablet TAKE 1 TABLET BY MOUTH  DAILY 90 tablet 1   Blood Glucose Monitoring Suppl (ONE TOUCH ULTRA 2) w/Device KIT Use daily to check blood sugar E11.9 1 kit 0   Budeson-Glycopyrrol-Formoterol (BREZTRI AEROSPHERE) 160-9-4.8 MCG/ACT AERO Inhale into the lungs.     buPROPion (WELLBUTRIN SR) 150 MG 12 hr tablet Take 1 tablet (150 mg total) by mouth 2 (two) times daily.  60 tablet 1   cyanocobalamin (,VITAMIN B-12,) 1000 MCG/ML injection Inject 1 mL (1,000 mcg total) into the muscle once a week. 4 mL 1   DULoxetine (CYMBALTA) 60 MG capsule TAKE 1 CAPSULE BY MOUTH  ONCE DAILY 90 capsule 3   finasteride (PROSCAR) 5 MG tablet Take 1 tablet (5 mg total) by mouth at bedtime. 90 tablet 2   fluticasone (FLONASE) 50 MCG/ACT nasal spray USE 2 SPRAY(S) IN EACH NOSTRIL ONCE DAILY AS NEEDED FOR ALLERGIES OR RHIITIS 16 g 0   glucose blood test strip Use new test strip each time when checking blood sugar 100 each 2   ipratropium-albuterol (DUONEB) 0.5-2.5 (3) MG/3ML SOLN Take 3 mLs by nebulization every 6 (six) hours as needed.     losartan (COZAAR) 100 MG tablet TAKE 1 TABLET BY MOUTH  DAILY 90 tablet 3   Melatonin 10 MG TABS Take 5 mg by mouth at bedtime.     metoprolol succinate  (TOPROL-XL) 25 MG 24 hr tablet Take 25 mg by mouth daily.      omeprazole (PRILOSEC) 40 MG capsule TAKE 1 CAPSULE BY MOUTH  DAILY 90 capsule 3   OneTouch Delica Lancets 25O MISC Use a new lancet each time when checking blood sugar 100 each 2   sitaGLIPtin (JANUVIA) 100 MG tablet Take 1 tablet (100 mg total) by mouth daily. 90 tablet 3   tamsulosin (FLOMAX) 0.4 MG CAPS capsule TAKE 1 CAPSULE BY MOUTH  DAILY 90 capsule 1   traZODone (DESYREL) 50 MG tablet Take 1 tablet (50 mg total) by mouth at bedtime as needed for sleep. 90 tablet 0   sildenafil (VIAGRA) 100 MG tablet TAKE 1 TABLET BY MOUTH AS NEEDED APPROXIMATELY 1 HOUR PRIOR TO SEXUAL ACTIVITY 10 tablet 0   tiZANidine (ZANAFLEX) 4 MG tablet TAKE 1 TABLET BY MOUTH THREE TIMES DAILY 90 tablet 0   No facility-administered medications prior to visit.    Allergies  Allergen Reactions   Gabapentin Other (See Comments)    Adverse reaction   Sertraline Other (See Comments)    Adverse reaction   Metformin Diarrhea and Nausea Only   Pineapple Hives   Pravastatin Other (See Comments)   Lorazepam Other (See Comments)    Shakiness    Review of Systems  Constitutional:  Negative for chills and fever.  Respiratory:  Positive for shortness of breath (baseline). Negative for cough.   Cardiovascular:  Negative for chest pain.  Gastrointestinal:  Negative for abdominal pain, nausea and vomiting.  Endocrine: Positive for polyuria. Negative for polydipsia and polyphagia.  Genitourinary:  Positive for dysuria, frequency and urgency. Negative for flank pain and hematuria.      Objective:    Physical Exam Vitals reviewed.  Constitutional:      Appearance: Normal appearance.  Cardiovascular:     Rate and Rhythm: Normal rate and regular rhythm.     Heart sounds: Normal heart sounds.  Pulmonary:     Effort: Pulmonary effort is normal.     Breath sounds: Normal breath sounds.  Abdominal:     General: Abdomen is flat.     Palpations: Abdomen is  soft.     Tenderness: There is no abdominal tenderness.  Neurological:     Mental Status: He is alert and oriented to person, place, and time.  Psychiatric:        Mood and Affect: Mood normal.        Behavior: Behavior normal.    BP 140/78   Pulse 88  Temp 97.7 F (36.5 C)   Resp 20   Ht $R'5\' 7"'jM$  (1.702 m)   Wt 189 lb (85.7 kg)   BMI 29.60 kg/m  Wt Readings from Last 3 Encounters:  10/17/20 189 lb (85.7 kg)  09/18/20 188 lb (85.3 kg)  08/13/20 185 lb (83.9 kg)    Health Maintenance Due  Topic Date Due   COVID-19 Vaccine (1) Never done   OPHTHALMOLOGY EXAM  Never done   Hepatitis C Screening  Never done   TETANUS/TDAP  Never done   Zoster Vaccines- Shingrix (1 of 2) Never done    There are no preventive care reminders to display for this patient.   Lab Results  Component Value Date   TSH 2.540 10/20/2016   Lab Results  Component Value Date   WBC 7.1 08/13/2020   HGB 14.3 08/13/2020   HCT 42.5 08/13/2020   MCV 89 08/13/2020   PLT 272 08/13/2020   Lab Results  Component Value Date   NA 141 08/13/2020   K 4.1 08/13/2020   CO2 24 08/13/2020   GLUCOSE 146 (H) 08/13/2020   BUN 10 08/13/2020   CREATININE 0.90 08/13/2020   BILITOT 0.6 08/13/2020   ALKPHOS 81 08/13/2020   AST 17 08/13/2020   ALT 14 08/13/2020   PROT 7.3 08/13/2020   ALBUMIN 4.4 08/13/2020   CALCIUM 9.4 08/13/2020   ANIONGAP 11 05/13/2017   EGFR 89 08/13/2020   Lab Results  Component Value Date   CHOL 121 08/13/2020   Lab Results  Component Value Date   HDL 35 (L) 08/13/2020   Lab Results  Component Value Date   LDLCALC 68 08/13/2020   Lab Results  Component Value Date   TRIG 92 08/13/2020   Lab Results  Component Value Date   CHOLHDL 3.5 08/13/2020   Lab Results  Component Value Date   HGBA1C 7.1 (H) 08/13/2020       Assessment & Plan:  1. Acute cystitis with hematuria - POCT urinalysis dipstick - Urine Culture - cephALEXin (KEFLEX) 500 MG capsule; Take 1 capsule  (500 mg total) by mouth 2 (two) times daily.  Dispense: 20 capsule; Refill: 0  2. Type 2 diabetes mellitus with diabetic neuropathy, without long-term current use of insulin (HCC)  Recommend restart januvia 100 mg once daily.   Meds ordered this encounter  Medications   cephALEXin (KEFLEX) 500 MG capsule    Sig: Take 1 capsule (500 mg total) by mouth 2 (two) times daily.    Dispense:  20 capsule    Refill:  0    Orders Placed This Encounter  Procedures   Urine Culture   POCT urinalysis dipstick     Follow-up: Return if symptoms worsen or fail to improve.  An After Visit Summary was printed and given to the patient.  Rochel Brome, MD [facility] [phone]

## 2020-10-18 ENCOUNTER — Other Ambulatory Visit: Payer: Self-pay

## 2020-10-18 MED ORDER — SILDENAFIL CITRATE 100 MG PO TABS: ORAL_TABLET | ORAL | 2 refills | Status: AC

## 2020-10-19 LAB — URINE CULTURE

## 2020-10-21 ENCOUNTER — Other Ambulatory Visit: Payer: Self-pay | Admitting: Physician Assistant

## 2020-10-22 ENCOUNTER — Telehealth: Payer: Self-pay

## 2020-10-22 NOTE — Progress Notes (Signed)
Chronic Care Management Pharmacy Assistant   Name: Wayne [name]  MRN: [id number] DOB: 04/06/1946   Reason for Encounter: Disease State for COPD  Recent office visits:  10/17/20-Dr. Tobie Poet PCP, dysuria, start Cephalexin,   09/18/20-Dr Cox PCP, right hip pain, start Januvia, stop aspirin, hip and knee images ordered, B12 491. Normal.   Recent consult visits:  none  Hospital visits:  None in previous 6 months  Medications: Outpatient Encounter Medications as of 10/22/2020  Medication Sig   acetaminophen (TYLENOL) 500 MG tablet Take 1 tablet (500 mg total) by mouth every 6 (six) hours as needed.   albuterol (PROVENTIL) (2.5 MG/3ML) 0.083% nebulizer solution Inhale into the lungs.    albuterol (VENTOLIN HFA) 108 (90 Base) MCG/ACT inhaler Inhale into the lungs every 6 (six) hours as needed.   amLODipine (NORVASC) 5 MG tablet TAKE 1 TABLET BY MOUTH  DAILY   Aspirin-Salicylamide-Caffeine (BC HEADACHE POWDER PO) Take 1 packet by mouth in the morning and at bedtime.   atorvastatin (LIPITOR) 40 MG tablet TAKE 1 TABLET BY MOUTH  DAILY   Blood Glucose Monitoring Suppl (ONE TOUCH ULTRA 2) w/Device KIT Use daily to check blood sugar E11.9   Budeson-Glycopyrrol-Formoterol (BREZTRI AEROSPHERE) 160-9-4.8 MCG/ACT AERO Inhale into the lungs.   buPROPion (WELLBUTRIN SR) 150 MG 12 hr tablet Take 1 tablet (150 mg total) by mouth 2 (two) times daily.   cephALEXin (KEFLEX) 500 MG capsule Take 1 capsule (500 mg total) by mouth 2 (two) times daily.   cyanocobalamin (,VITAMIN B-12,) 1000 MCG/ML injection Inject 1 mL (1,000 mcg total) into the muscle once a week.   DULoxetine (CYMBALTA) 60 MG capsule TAKE 1 CAPSULE BY MOUTH  ONCE DAILY   finasteride (PROSCAR) 5 MG tablet Take 1 tablet (5 mg total) by mouth at bedtime.   fluticasone (FLONASE) 50 MCG/ACT nasal spray USE 2 SPRAY(S) IN EACH NOSTRIL ONCE DAILY AS NEEDED FOR ALLERGIES OR RHIITIS   glucose blood test strip Use new test strip each time when  checking blood sugar   ipratropium-albuterol (DUONEB) 0.5-2.5 (3) MG/3ML SOLN Take 3 mLs by nebulization every 6 (six) hours as needed.   losartan (COZAAR) 100 MG tablet TAKE 1 TABLET BY MOUTH  DAILY   Melatonin 10 MG TABS Take 5 mg by mouth at bedtime.   metoprolol succinate (TOPROL-XL) 25 MG 24 hr tablet Take 25 mg by mouth daily.    omeprazole (PRILOSEC) 40 MG capsule TAKE 1 CAPSULE BY MOUTH  DAILY   OneTouch Delica Lancets 28M MISC Use a new lancet each time when checking blood sugar   sildenafil (VIAGRA) 100 MG tablet TAKE 1 TABLET BY MOUTH AS NEEDED APPROXIMATELY 1 HOUR PRIOR TO SEXUAL ACTIVITY   sitaGLIPtin (JANUVIA) 100 MG tablet Take 1 tablet (100 mg total) by mouth daily.   tamsulosin (FLOMAX) 0.4 MG CAPS capsule TAKE 1 CAPSULE BY MOUTH  DAILY   tiZANidine (ZANAFLEX) 4 MG tablet TAKE 1 TABLET BY MOUTH THREE TIMES DAILY   traZODone (DESYREL) 50 MG tablet Take 1 tablet (50 mg total) by mouth at bedtime as needed for sleep.   No facility-administered encounter medications on file as of 10/22/2020.     Current COPD regimen:   Albuerol  Duoneb  Breztri  not using said was to expensive   No flowsheet data found.  Any recent hospitalizations or ED visits since last visit with CPP? No  reports the following COPD symptoms, including Increased shortness of breath    What recent interventions/DTPs  have been made by any provider to improve breathing since last visit: Patient stated he has  dizziness in the mornings, he takes melatonin at night and muscle relaxer but he stated he is not getting much sleep. Patient is using his albuterol inhaler as directed.    Have you had exacerbation/flare-up since last visit? Yes  What do you do when you are short of breath?  Rest  Current tobacco use? Interested in cessation? No, he is a current smoker 2 packs per day   Respiratory Devices/Equipment Do you have a nebulizer? Yes, but doesn't help much  Do you use a Peak Flow Meter? No Do you use  a maintenance inhaler? No How often do you forget to use your daily inhaler? No  Do you use a rescue inhaler? Yes How often do you use your rescue inhaler?  daily to twice a day  Do you use a spacer with your inhaler? No  Adherence Review: Does the patient have >5 day gap between last estimated fill date for maintenance inhaler medications? CPP to review  [facility]  confirmed with [facility] Atorvastatin 09/25/20 90 Losartan 10/17/20  90 Amlodipine 09/25/20 [place], [place] Pharmacist Assistant [phone]

## 2020-10-27 ENCOUNTER — Encounter: Payer: Self-pay | Admitting: Family Medicine

## 2020-11-06 ENCOUNTER — Telehealth: Payer: Self-pay

## 2020-11-06 NOTE — Telephone Encounter (Signed)
Pt's wife calling states pt is still having urine discoloration. Asking for more antibiotics. Let wife know on lab work it stated if symptoms continued we could refer to urology. Spoke w/ NP and NP advised this is best. Advised wife, wife states she will need to ask pt before referral is placed. She is to call back.  Harrell Lark 11/06/20 3:55 PM

## 2020-11-07 ENCOUNTER — Other Ambulatory Visit: Payer: Self-pay

## 2020-11-07 DIAGNOSIS — I712 Thoracic aortic aneurysm, without rupture, unspecified: Secondary | ICD-10-CM

## 2020-11-08 DIAGNOSIS — F17200 Nicotine dependence, unspecified, uncomplicated: Secondary | ICD-10-CM | POA: Diagnosis not present

## 2020-11-08 DIAGNOSIS — J449 Chronic obstructive pulmonary disease, unspecified: Secondary | ICD-10-CM | POA: Diagnosis not present

## 2020-11-08 DIAGNOSIS — K219 Gastro-esophageal reflux disease without esophagitis: Secondary | ICD-10-CM | POA: Diagnosis not present

## 2020-11-08 DIAGNOSIS — E1140 Type 2 diabetes mellitus with diabetic neuropathy, unspecified: Secondary | ICD-10-CM | POA: Diagnosis not present

## 2020-11-08 DIAGNOSIS — G629 Polyneuropathy, unspecified: Secondary | ICD-10-CM | POA: Diagnosis not present

## 2020-11-08 DIAGNOSIS — Z79899 Other long term (current) drug therapy: Secondary | ICD-10-CM | POA: Diagnosis not present

## 2020-11-08 DIAGNOSIS — E785 Hyperlipidemia, unspecified: Secondary | ICD-10-CM | POA: Diagnosis not present

## 2020-11-08 DIAGNOSIS — I10 Essential (primary) hypertension: Secondary | ICD-10-CM | POA: Diagnosis not present

## 2020-11-08 DIAGNOSIS — Z0189 Encounter for other specified special examinations: Secondary | ICD-10-CM | POA: Diagnosis not present

## 2020-11-08 NOTE — Progress Notes (Signed)
11/08/20-Sara Owens Shark CPP asked me to call and check his PAP status for [facility], representative stated he needed a new application.  His consent was still good thru July 22, but since it has been more than 64mos since he was enrolled they need an new application.  I have notified Donette Larry CPP  Clarita Leber, [place] Pharmacist Assistant [phone]

## 2020-11-15 ENCOUNTER — Telehealth: Payer: Self-pay

## 2020-11-15 ENCOUNTER — Ambulatory Visit: Payer: Medicare Other | Admitting: Family Medicine

## 2020-11-15 NOTE — Progress Notes (Signed)
Chronic Care Management Pharmacy Assistant   Name: Wayne [name]  MRN: [id number] DOB: 03/21/1946   Reason for Encounter: Medication Review for Breztri PAP status      Medications: Outpatient Encounter Medications as of 11/15/2020  Medication Sig   acetaminophen (TYLENOL) 500 MG tablet Take 1 tablet (500 mg total) by mouth every 6 (six) hours as needed.   albuterol (PROVENTIL) (2.5 MG/3ML) 0.083% nebulizer solution Inhale into the lungs.    albuterol (VENTOLIN HFA) 108 (90 Base) MCG/ACT inhaler Inhale into the lungs every 6 (six) hours as needed.   amLODipine (NORVASC) 5 MG tablet TAKE 1 TABLET BY MOUTH  DAILY   Aspirin-Salicylamide-Caffeine (BC HEADACHE POWDER PO) Take 1 packet by mouth in the morning and at bedtime.   atorvastatin (LIPITOR) 40 MG tablet TAKE 1 TABLET BY MOUTH  DAILY   Blood Glucose Monitoring Suppl (ONE TOUCH ULTRA 2) w/Device KIT Use daily to check blood sugar E11.9   Budeson-Glycopyrrol-Formoterol (BREZTRI AEROSPHERE) 160-9-4.8 MCG/ACT AERO Inhale into the lungs.   buPROPion (WELLBUTRIN SR) 150 MG 12 hr tablet Take 1 tablet (150 mg total) by mouth 2 (two) times daily.   cephALEXin (KEFLEX) 500 MG capsule Take 1 capsule (500 mg total) by mouth 2 (two) times daily.   cyanocobalamin (,VITAMIN B-12,) 1000 MCG/ML injection Inject 1 mL (1,000 mcg total) into the muscle once a week.   DULoxetine (CYMBALTA) 60 MG capsule TAKE 1 CAPSULE BY MOUTH  ONCE DAILY   finasteride (PROSCAR) 5 MG tablet Take 1 tablet (5 mg total) by mouth at bedtime.   fluticasone (FLONASE) 50 MCG/ACT nasal spray USE 2 SPRAY(S) IN EACH NOSTRIL ONCE DAILY AS NEEDED FOR ALLERGIES OR RHIITIS   glucose blood test strip Use new test strip each time when checking blood sugar   ipratropium-albuterol (DUONEB) 0.5-2.5 (3) MG/3ML SOLN Take 3 mLs by nebulization every 6 (six) hours as needed.   losartan (COZAAR) 100 MG tablet TAKE 1 TABLET BY MOUTH  DAILY   Melatonin 10 MG TABS Take 5 mg by mouth at  bedtime.   metoprolol succinate (TOPROL-XL) 25 MG 24 hr tablet Take 25 mg by mouth daily.    omeprazole (PRILOSEC) 40 MG capsule TAKE 1 CAPSULE BY MOUTH  DAILY   OneTouch Delica Lancets 34H MISC Use a new lancet each time when checking blood sugar   sildenafil (VIAGRA) 100 MG tablet TAKE 1 TABLET BY MOUTH AS NEEDED APPROXIMATELY 1 HOUR PRIOR TO SEXUAL ACTIVITY   sitaGLIPtin (JANUVIA) 100 MG tablet Take 1 tablet (100 mg total) by mouth daily.   tamsulosin (FLOMAX) 0.4 MG CAPS capsule TAKE 1 CAPSULE BY MOUTH  DAILY   tiZANidine (ZANAFLEX) 4 MG tablet TAKE 1 TABLET BY MOUTH THREE TIMES DAILY   traZODone (DESYREL) 50 MG tablet Take 1 tablet (50 mg total) by mouth at bedtime as needed for sleep.   No facility-administered encounter medications on file as of 11/15/2020.   Spoke to [employer], she stated that his application was not finished, she was going to finish while we are on the call. She stated with Medicare to renew, all the patient has to do is call in and answer 3 questions and get automatic approval or the  provider can go online and do this as well, a new application is not necessary at the end of December 2022 for the next year enrollment.  Representative processed his claim and he was approved thru Dec 2022, she will contact him to coordinate shipment.  Clarita Leber, [place] Pharmacist Assistant [phone]

## 2020-11-20 ENCOUNTER — Other Ambulatory Visit: Payer: Self-pay | Admitting: Family Medicine

## 2020-11-22 ENCOUNTER — Other Ambulatory Visit: Payer: Self-pay | Admitting: Family Medicine

## 2020-11-26 ENCOUNTER — Telehealth: Payer: Medicare Other

## 2020-12-12 ENCOUNTER — Other Ambulatory Visit: Payer: Self-pay | Admitting: Family Medicine

## 2020-12-30 ENCOUNTER — Telehealth: Payer: Self-pay

## 2020-12-30 NOTE — Chronic Care Management (AMB) (Signed)
Error

## 2021-01-17 ENCOUNTER — Telehealth: Payer: Self-pay

## 2021-01-17 NOTE — Telephone Encounter (Signed)
I left voicemail to call us back to set up an appointment for AWV.

## 2021-01-24 ENCOUNTER — Other Ambulatory Visit: Payer: Self-pay | Admitting: Urology

## 2021-02-12 ENCOUNTER — Other Ambulatory Visit: Payer: Self-pay | Admitting: Family Medicine

## 2021-02-13 ENCOUNTER — Encounter (HOSPITAL_COMMUNITY): Payer: Self-pay | Admitting: Anesthesiology

## 2021-02-13 ENCOUNTER — Encounter (HOSPITAL_BASED_OUTPATIENT_CLINIC_OR_DEPARTMENT_OTHER): Payer: Self-pay | Admitting: Urology

## 2021-02-13 NOTE — Progress Notes (Signed)
Patient has not followed up with cardiologist since 03/07/19. Spoke with Dr. Salvatore Marvel, MDA. Patient needs cardiac clearance prior to 02/21/2021 surgery per Dr. Elgie Congo. Called Darrel Reach at Dr. Virgina Norfolk office and left message that patient needs cardiac clearance prior to 02/21/21 surgery per Dr. Elgie Congo.

## 2021-02-17 NOTE — Progress Notes (Signed)
Per Dr. Salvatore Marvel MDA patient needed cardiac clearance for surgery. Patient was seen by cardiologist, Dr. Sherryle Lis on 02/14/2021. He was noted to be a high risk for surgery. Dr. Beatrix Fetters also said the patient needs a pulmonary clearance before surgery. Patient has a 03/04/21 appointment with pulmonologist for clearance. Per Dr.  Splinter MDA patient must be done at the main hospital and not at the surgery center. I left a message with the surgeon's scheduler, Marlowe Kays stating the above on 02/17/2021.

## 2021-02-18 ENCOUNTER — Encounter (HOSPITAL_COMMUNITY): Payer: Self-pay | Admitting: Emergency Medicine

## 2021-02-18 ENCOUNTER — Emergency Department (HOSPITAL_COMMUNITY): Payer: Medicare Other

## 2021-02-18 ENCOUNTER — Inpatient Hospital Stay (HOSPITAL_COMMUNITY)
Admission: EM | Admit: 2021-02-18 | Discharge: 2021-02-24 | DRG: 066 | Disposition: A | Discharge status: Home Health | Payer: Medicare Other | Attending: Internal Medicine | Admitting: Internal Medicine

## 2021-02-18 DIAGNOSIS — E876 Hypokalemia: Secondary | ICD-10-CM | POA: Diagnosis present

## 2021-02-18 DIAGNOSIS — R29700 NIHSS score 0: Secondary | ICD-10-CM | POA: Diagnosis present

## 2021-02-18 DIAGNOSIS — Z8249 Family history of ischemic heart disease and other diseases of the circulatory system: Secondary | ICD-10-CM

## 2021-02-18 DIAGNOSIS — I672 Cerebral atherosclerosis: Secondary | ICD-10-CM | POA: Diagnosis present

## 2021-02-18 DIAGNOSIS — K219 Gastro-esophageal reflux disease without esophagitis: Secondary | ICD-10-CM | POA: Diagnosis present

## 2021-02-18 DIAGNOSIS — G4700 Insomnia, unspecified: Secondary | ICD-10-CM | POA: Diagnosis present

## 2021-02-18 DIAGNOSIS — N400 Enlarged prostate without lower urinary tract symptoms: Secondary | ICD-10-CM | POA: Diagnosis present

## 2021-02-18 DIAGNOSIS — I10 Essential (primary) hypertension: Secondary | ICD-10-CM | POA: Diagnosis present

## 2021-02-18 DIAGNOSIS — Z720 Tobacco use: Secondary | ICD-10-CM | POA: Diagnosis present

## 2021-02-18 DIAGNOSIS — R001 Bradycardia, unspecified: Secondary | ICD-10-CM | POA: Diagnosis present

## 2021-02-18 DIAGNOSIS — G459 Transient cerebral ischemic attack, unspecified: Secondary | ICD-10-CM | POA: Diagnosis present

## 2021-02-18 DIAGNOSIS — R4781 Slurred speech: Secondary | ICD-10-CM | POA: Diagnosis present

## 2021-02-18 DIAGNOSIS — E119 Type 2 diabetes mellitus without complications: Secondary | ICD-10-CM

## 2021-02-18 DIAGNOSIS — Z888 Allergy status to other drugs, medicaments and biological substances status: Secondary | ICD-10-CM

## 2021-02-18 DIAGNOSIS — E782 Mixed hyperlipidemia: Secondary | ICD-10-CM | POA: Diagnosis present

## 2021-02-18 DIAGNOSIS — R29703 NIHSS score 3: Secondary | ICD-10-CM | POA: Diagnosis not present

## 2021-02-18 DIAGNOSIS — Z833 Family history of diabetes mellitus: Secondary | ICD-10-CM

## 2021-02-18 DIAGNOSIS — I2510 Atherosclerotic heart disease of native coronary artery without angina pectoris: Secondary | ICD-10-CM | POA: Diagnosis present

## 2021-02-18 DIAGNOSIS — J438 Other emphysema: Secondary | ICD-10-CM | POA: Diagnosis present

## 2021-02-18 DIAGNOSIS — E041 Nontoxic single thyroid nodule: Secondary | ICD-10-CM | POA: Diagnosis present

## 2021-02-18 DIAGNOSIS — I6329 Cerebral infarction due to unspecified occlusion or stenosis of other precerebral arteries: Principal | ICD-10-CM | POA: Diagnosis present

## 2021-02-18 DIAGNOSIS — R29702 NIHSS score 2: Secondary | ICD-10-CM | POA: Diagnosis not present

## 2021-02-18 DIAGNOSIS — H8109 Meniere's disease, unspecified ear: Secondary | ICD-10-CM | POA: Diagnosis present

## 2021-02-18 DIAGNOSIS — I712 Thoracic aortic aneurysm, without rupture, unspecified: Secondary | ICD-10-CM | POA: Diagnosis present

## 2021-02-18 DIAGNOSIS — G8311 Monoplegia of lower limb affecting right dominant side: Secondary | ICD-10-CM | POA: Diagnosis not present

## 2021-02-18 DIAGNOSIS — Z7984 Long term (current) use of oral hypoglycemic drugs: Secondary | ICD-10-CM

## 2021-02-18 DIAGNOSIS — Z9114 Patient's other noncompliance with medication regimen: Secondary | ICD-10-CM

## 2021-02-18 DIAGNOSIS — Z20822 Contact with and (suspected) exposure to covid-19: Secondary | ICD-10-CM | POA: Diagnosis present

## 2021-02-18 DIAGNOSIS — Z91018 Allergy to other foods: Secondary | ICD-10-CM

## 2021-02-18 DIAGNOSIS — T465X6A Underdosing of other antihypertensive drugs, initial encounter: Secondary | ICD-10-CM | POA: Diagnosis present

## 2021-02-18 DIAGNOSIS — Z825 Family history of asthma and other chronic lower respiratory diseases: Secondary | ICD-10-CM

## 2021-02-18 DIAGNOSIS — R29810 Facial weakness: Secondary | ICD-10-CM | POA: Diagnosis present

## 2021-02-18 DIAGNOSIS — N529 Male erectile dysfunction, unspecified: Secondary | ICD-10-CM | POA: Diagnosis present

## 2021-02-18 DIAGNOSIS — I639 Cerebral infarction, unspecified: Secondary | ICD-10-CM

## 2021-02-18 DIAGNOSIS — Z79899 Other long term (current) drug therapy: Secondary | ICD-10-CM

## 2021-02-18 DIAGNOSIS — Z7951 Long term (current) use of inhaled steroids: Secondary | ICD-10-CM

## 2021-02-18 DIAGNOSIS — F17210 Nicotine dependence, cigarettes, uncomplicated: Secondary | ICD-10-CM | POA: Diagnosis present

## 2021-02-18 LAB — COMPREHENSIVE METABOLIC PANEL
ALT: 13 U/L (ref 0–44)
AST: 13 U/L — ABNORMAL LOW (ref 15–41)
Albumin: 3.7 g/dL (ref 3.5–5.0)
Alkaline Phosphatase: 58 U/L (ref 38–126)
Anion gap: 9 (ref 5–15)
BUN: 11 mg/dL (ref 8–23)
CO2: 21 mmol/L — ABNORMAL LOW (ref 22–32)
Calcium: 9.1 mg/dL (ref 8.9–10.3)
Chloride: 108 mmol/L (ref 98–111)
Creatinine, Ser: 0.88 mg/dL (ref 0.61–1.24)
GFR, Estimated: >60 mL/min (ref >60)
Glucose, Bld: 117 mg/dL — ABNORMAL HIGH (ref 70–99)
Potassium: 3 mmol/L — ABNORMAL LOW (ref 3.5–5.1)
Sodium: 138 mmol/L (ref 135–145)
Total Bilirubin: 1 mg/dL (ref 0.3–1.2)
Total Protein: 6.9 g/dL (ref 6.5–8.1)

## 2021-02-18 LAB — CBC
HCT: 41.2 % (ref 39.0–52.0)
Hemoglobin: 13.8 g/dL (ref 13.0–17.0)
MCH: 30.4 pg (ref 26.0–34.0)
MCHC: 33.5 g/dL (ref 30.0–36.0)
MCV: 90.7 fL (ref 80.0–100.0)
Platelets: 245 10*3/uL (ref 150–400)
RBC: 4.54 MIL/uL (ref 4.22–5.81)
RDW: 13.4 % (ref 11.5–15.5)
WBC: 6.8 10*3/uL (ref 4.0–10.5)
nRBC: 0 % (ref 0.0–0.2)

## 2021-02-18 LAB — APTT: aPTT: 34 seconds (ref 24–36)

## 2021-02-18 LAB — I-STAT CHEM 8, ED
BUN: 10 mg/dL (ref 8–23)
Calcium, Ion: 1.21 mmol/L (ref 1.15–1.40)
Chloride: 107 mmol/L (ref 98–111)
Creatinine, Ser: 0.8 mg/dL (ref 0.61–1.24)
Glucose, Bld: 113 mg/dL — ABNORMAL HIGH (ref 70–99)
HCT: 41 % (ref 39.0–52.0)
Hemoglobin: 13.9 g/dL (ref 13.0–17.0)
Potassium: 3.1 mmol/L — ABNORMAL LOW (ref 3.5–5.1)
Sodium: 143 mmol/L (ref 135–145)
TCO2: 21 mmol/L — ABNORMAL LOW (ref 22–32)

## 2021-02-18 LAB — DIFFERENTIAL
Abs Immature Granulocytes: 0.02 10*3/uL (ref 0.00–0.07)
Basophils Absolute: 0.1 10*3/uL (ref 0.0–0.1)
Basophils Relative: 1 %
Eosinophils Absolute: 0.1 10*3/uL (ref 0.0–0.5)
Eosinophils Relative: 2 %
Immature Granulocytes: 0 %
Lymphocytes Relative: 23 %
Lymphs Abs: 1.5 10*3/uL (ref 0.7–4.0)
Monocytes Absolute: 0.4 10*3/uL (ref 0.1–1.0)
Monocytes Relative: 6 %
Neutro Abs: 4.6 10*3/uL (ref 1.7–7.7)
Neutrophils Relative %: 68 %

## 2021-02-18 LAB — PROTIME-INR
INR: 1.2 (ref 0.8–1.2)
Prothrombin Time: 15.4 seconds — ABNORMAL HIGH (ref 11.4–15.2)

## 2021-02-18 MED ORDER — LORAZEPAM 2 MG/ML IJ SOLN
0.5000 mg | Freq: Once | INTRAMUSCULAR | Status: DC
  Filled 2021-02-18: qty 1

## 2021-02-18 NOTE — ED Provider Notes (Signed)
Emergency Medicine Provider Triage Evaluation Note  Wayne [name] , a 75 y.o. male  was evaluated in triage after being sent by outside provider for evaluation of CVA.  Patient reports that since around midnight 10/3 he was experiencing inability to move his right lower leg.  Still with difficulty walking and numbness in leg.   Review of Systems  Positive: Weakness, numbness, dizzy Negative: Cp, visual disturb  Physical Exam  BP (!) 159/77 (BP Location: Left Arm)   Pulse (!) 57   Temp 98.1 F (36.7 C)   Resp 16   SpO2 98%  Gen:   Awake, no distress   Resp:  Normal effort  MSK:   Moves extremities without difficulty  Other:    Medical Decision Making  Medically screening exam initiated at 12:15 PM.  Appropriate orders placed.  Wayne [name] was informed that the remainder of the evaluation will be completed by another provider, this initial triage assessment does not replace that evaluation, and the importance of remaining in the ED until their evaluation is complete.   LKW 1 am 10/3, no hx of CVA   Alyvia Derk A, PA-C 02/18/21 1218    Lorelle Gibbs, DO 02/18/21 1307

## 2021-02-18 NOTE — ED Notes (Addendum)
Pt states that R leg weakness started yesterday and unsteady gait began this afternoon around 1pm, neuro intact bilaterally.

## 2021-02-18 NOTE — ED Triage Notes (Addendum)
Patient BIB EMS for complaint of dizziness and numbness and weakness in left leg, last normal at 0100 on 02/17/2021.  122/90 Hr 60 98% room air  Patient states his dizziness started two or three years ago and has been evaluated multiple times but has never been able to determine the cause of his dizziness.

## 2021-02-18 NOTE — Progress Notes (Signed)
Brought pt over from [facility] @ 1344, pt refused MRI unless given meds for claustrophobia. Pt sent back to ED and RN informed.

## 2021-02-18 NOTE — Progress Notes (Signed)
Spoke with connie at [facility] urology and patient on way to [place] with cva and surgery to be cancelled.

## 2021-02-19 ENCOUNTER — Observation Stay (HOSPITAL_BASED_OUTPATIENT_CLINIC_OR_DEPARTMENT_OTHER): Payer: Medicare Other

## 2021-02-19 ENCOUNTER — Observation Stay (HOSPITAL_COMMUNITY): Payer: Medicare Other

## 2021-02-19 DIAGNOSIS — I10 Essential (primary) hypertension: Secondary | ICD-10-CM

## 2021-02-19 DIAGNOSIS — E119 Type 2 diabetes mellitus without complications: Secondary | ICD-10-CM

## 2021-02-19 DIAGNOSIS — I639 Cerebral infarction, unspecified: Secondary | ICD-10-CM | POA: Diagnosis present

## 2021-02-19 DIAGNOSIS — I6389 Other cerebral infarction: Secondary | ICD-10-CM

## 2021-02-19 DIAGNOSIS — E782 Mixed hyperlipidemia: Secondary | ICD-10-CM

## 2021-02-19 DIAGNOSIS — J438 Other emphysema: Secondary | ICD-10-CM

## 2021-02-19 LAB — CREATININE, SERUM
Creatinine, Ser: 0.85 mg/dL (ref 0.61–1.24)
GFR, Estimated: >60 mL/min (ref >60)

## 2021-02-19 LAB — MAGNESIUM: Magnesium: 1.4 mg/dL — ABNORMAL LOW (ref 1.7–2.4)

## 2021-02-19 LAB — CBC
HCT: 40.9 % (ref 39.0–52.0)
Hemoglobin: 14 g/dL (ref 13.0–17.0)
MCH: 30.6 pg (ref 26.0–34.0)
MCHC: 34.2 g/dL (ref 30.0–36.0)
MCV: 89.3 fL (ref 80.0–100.0)
Platelets: 259 10*3/uL (ref 150–400)
RBC: 4.58 MIL/uL (ref 4.22–5.81)
RDW: 13.4 % (ref 11.5–15.5)
WBC: 7.6 10*3/uL (ref 4.0–10.5)
nRBC: 0 % (ref 0.0–0.2)

## 2021-02-19 LAB — CBG MONITORING, ED
Glucose-Capillary: 89 mg/dL (ref 70–99)
Glucose-Capillary: 119 mg/dL — ABNORMAL HIGH (ref 70–99)
Glucose-Capillary: 116 mg/dL — ABNORMAL HIGH (ref 70–99)

## 2021-02-19 LAB — ECHOCARDIOGRAM COMPLETE
Area-P 1/2: 3.6 cm2
Calc EF: 51.8 %
S' Lateral: 3.25 cm
Single Plane A2C EF: 47.9 %
Single Plane A4C EF: 58.7 %

## 2021-02-19 LAB — LIPID PANEL
Cholesterol: 106 mg/dL (ref 0–200)
HDL: 32 mg/dL — ABNORMAL LOW (ref >40)
LDL Cholesterol: 58 mg/dL (ref 0–99)
Total CHOL/HDL Ratio: 3.3 RATIO
Triglycerides: 81 mg/dL (ref <150)
VLDL: 16 mg/dL (ref 0–40)

## 2021-02-19 LAB — RESP PANEL BY RT-PCR (FLU A&B, COVID) ARPGX2
Influenza A by PCR: NEGATIVE
Influenza B by PCR: NEGATIVE
SARS Coronavirus 2 by RT PCR: NEGATIVE

## 2021-02-19 LAB — HEMOGLOBIN A1C
Hgb A1c MFr Bld: 5.8 % — ABNORMAL HIGH (ref 4.8–5.6)
Mean Plasma Glucose: 119.76 mg/dL

## 2021-02-19 MED ORDER — ACETAMINOPHEN 650 MG RE SUPP: 650.0000 mg | Freq: Four times a day (QID) | RECTAL | Status: DC | PRN

## 2021-02-19 MED ORDER — IOHEXOL 350 MG/ML SOLN
50.0000 mL | Freq: Once | INTRAVENOUS | Status: AC | PRN
  Administered 2021-02-19: 50 mL via INTRAVENOUS

## 2021-02-19 MED ORDER — LOSARTAN POTASSIUM 50 MG PO TABS
100.0000 mg | ORAL_TABLET | Freq: Every day | ORAL | Status: DC
  Administered 2021-02-20 – 2021-02-24 (×5): 100 mg via ORAL
  Filled 2021-02-19 (×5): qty 2

## 2021-02-19 MED ORDER — SODIUM CHLORIDE 0.9 % IV SOLN: INTRAVENOUS | Status: DC

## 2021-02-19 MED ORDER — ENOXAPARIN SODIUM 40 MG/0.4ML IJ SOSY
40.0000 mg | PREFILLED_SYRINGE | INTRAMUSCULAR | Status: DC
  Administered 2021-02-19 – 2021-02-23 (×5): 40 mg via SUBCUTANEOUS
  Filled 2021-02-19 (×5): qty 0.4

## 2021-02-19 MED ORDER — PANTOPRAZOLE SODIUM 40 MG PO TBEC
40.0000 mg | DELAYED_RELEASE_TABLET | Freq: Every day | ORAL | Status: DC
  Administered 2021-02-19 – 2021-02-24 (×6): 40 mg via ORAL
  Filled 2021-02-19 (×7): qty 1

## 2021-02-19 MED ORDER — ACETAMINOPHEN 325 MG PO TABS
650.0000 mg | ORAL_TABLET | Freq: Four times a day (QID) | ORAL | Status: DC | PRN
  Administered 2021-02-20 – 2021-02-23 (×4): 650 mg via ORAL
  Filled 2021-02-19 (×5): qty 2

## 2021-02-19 MED ORDER — ASPIRIN EC 81 MG PO TBEC
81.0000 mg | DELAYED_RELEASE_TABLET | Freq: Every day | ORAL | Status: DC
  Administered 2021-02-19 – 2021-02-24 (×6): 81 mg via ORAL
  Filled 2021-02-19 (×6): qty 1

## 2021-02-19 MED ORDER — FLUTICASONE FUROATE-VILANTEROL 100-25 MCG/INH IN AEPB
1.0000 | INHALATION_SPRAY | Freq: Every day | RESPIRATORY_TRACT | Status: DC
  Administered 2021-02-20 – 2021-02-24 (×5): 1 via RESPIRATORY_TRACT
  Filled 2021-02-19 (×2): qty 28

## 2021-02-19 MED ORDER — BUDESON-GLYCOPYRROL-FORMOTEROL 160-9-4.8 MCG/ACT IN AERO: 1.0000 | INHALATION_SPRAY | Freq: Two times a day (BID) | RESPIRATORY_TRACT | Status: DC

## 2021-02-19 MED ORDER — CLOPIDOGREL BISULFATE 75 MG PO TABS
75.0000 mg | ORAL_TABLET | Freq: Every day | ORAL | Status: DC
  Administered 2021-02-19 – 2021-02-24 (×6): 75 mg via ORAL
  Filled 2021-02-19 (×7): qty 1

## 2021-02-19 MED ORDER — TAMSULOSIN HCL 0.4 MG PO CAPS
0.4000 mg | ORAL_CAPSULE | Freq: Every day | ORAL | Status: DC
  Administered 2021-02-19 – 2021-02-24 (×6): 0.4 mg via ORAL
  Filled 2021-02-19 (×6): qty 1

## 2021-02-19 MED ORDER — TOPIRAMATE 25 MG PO TABS
75.0000 mg | ORAL_TABLET | Freq: Every day | ORAL | Status: DC
  Administered 2021-02-20 – 2021-02-23 (×4): 75 mg via ORAL
  Filled 2021-02-19 (×4): qty 3

## 2021-02-19 MED ORDER — ONDANSETRON HCL 4 MG PO TABS: 4.0000 mg | ORAL_TABLET | Freq: Four times a day (QID) | ORAL | Status: DC | PRN

## 2021-02-19 MED ORDER — ASPIRIN EC 81 MG PO TBEC
81.0000 mg | DELAYED_RELEASE_TABLET | Freq: Every day | ORAL | Status: DC
  Administered 2021-02-19: 81 mg via ORAL
  Filled 2021-02-19: qty 1

## 2021-02-19 MED ORDER — POTASSIUM CHLORIDE CRYS ER 20 MEQ PO TBCR
40.0000 meq | EXTENDED_RELEASE_TABLET | ORAL | Status: AC
  Administered 2021-02-19: 40 meq via ORAL
  Filled 2021-02-19: qty 2

## 2021-02-19 MED ORDER — ATORVASTATIN CALCIUM 40 MG PO TABS
40.0000 mg | ORAL_TABLET | Freq: Every day | ORAL | Status: DC
  Administered 2021-02-19 – 2021-02-24 (×6): 40 mg via ORAL
  Filled 2021-02-19 (×7): qty 1

## 2021-02-19 MED ORDER — GADOBUTROL 1 MMOL/ML IV SOLN
7.0000 mL | Freq: Once | INTRAVENOUS | Status: AC | PRN
  Administered 2021-02-19: 7 mL via INTRAVENOUS

## 2021-02-19 MED ORDER — ALBUTEROL SULFATE (2.5 MG/3ML) 0.083% IN NEBU: 2.5000 mg | INHALATION_SOLUTION | RESPIRATORY_TRACT | Status: DC | PRN

## 2021-02-19 MED ORDER — INSULIN ASPART 100 UNIT/ML IJ SOLN
0.0000 [IU] | Freq: Three times a day (TID) | INTRAMUSCULAR | Status: DC
  Administered 2021-02-21 (×2): 2 [IU] via SUBCUTANEOUS
  Administered 2021-02-22: 1 [IU] via SUBCUTANEOUS
  Administered 2021-02-22: 3 [IU] via SUBCUTANEOUS
  Administered 2021-02-23: 2 [IU] via SUBCUTANEOUS
  Administered 2021-02-23 – 2021-02-24 (×3): 1 [IU] via SUBCUTANEOUS

## 2021-02-19 MED ORDER — UMECLIDINIUM BROMIDE 62.5 MCG/INH IN AEPB
1.0000 | INHALATION_SPRAY | Freq: Every day | RESPIRATORY_TRACT | Status: DC
  Administered 2021-02-20 – 2021-02-24 (×5): 1 via RESPIRATORY_TRACT
  Filled 2021-02-19 (×2): qty 7

## 2021-02-19 MED ORDER — ONDANSETRON HCL 4 MG/2ML IJ SOLN: 4.0000 mg | Freq: Four times a day (QID) | INTRAMUSCULAR | Status: DC | PRN

## 2021-02-19 MED ORDER — AMLODIPINE BESYLATE 10 MG PO TABS
10.0000 mg | ORAL_TABLET | Freq: Every day | ORAL | Status: DC
  Administered 2021-02-20 – 2021-02-24 (×5): 10 mg via ORAL
  Filled 2021-02-19: qty 2
  Filled 2021-02-19 (×4): qty 1

## 2021-02-19 MED ORDER — FINASTERIDE 5 MG PO TABS
5.0000 mg | ORAL_TABLET | Freq: Every day | ORAL | Status: DC
  Administered 2021-02-20 – 2021-02-23 (×4): 5 mg via ORAL
  Filled 2021-02-19 (×4): qty 1

## 2021-02-19 MED ORDER — INSULIN ASPART 100 UNIT/ML IJ SOLN
0.0000 [IU] | Freq: Every day | INTRAMUSCULAR | Status: DC
Start: 2021-02-19 — End: 2021-02-24
  Administered 2021-02-20: 2 [IU] via SUBCUTANEOUS

## 2021-02-19 MED ORDER — NICOTINE 21 MG/24HR TD PT24
21.0000 mg | MEDICATED_PATCH | Freq: Every day | TRANSDERMAL | Status: DC
  Administered 2021-02-19 – 2021-02-24 (×6): 21 mg via TRANSDERMAL
  Filled 2021-02-19 (×6): qty 1

## 2021-02-19 NOTE — Assessment & Plan Note (Signed)
Admit to observation telemetry bed. Stroke neurology team consulted by EDP. Check bubble echo. Check carotid dopplers. Asa and plavix. Check lipid panel. ST/OT/PT consults.

## 2021-02-19 NOTE — H&P (Signed)
History and Physical    Wayne [name] IRS:[id number] DOB: 09/25/1945 DOA: 02/18/2021  PCP: Cipriano Mile, NP   Patient coming from: Home  I have personally briefly reviewed patient's old medical records in [place]  CC: right leg weakness HPI: 75 year old white male with a history of COPD, hypertension, type 2 diabetes, hyperlipidemia, tobacco abuse presents the ER today at the behest of his family doctor.  Patient states that on Monday afternoon February 17, 2021, about 12 PM, patient was outside in his yard doing some yard work.  He had a sudden loss of strength in his right leg.  He also felt numb in his right leg.  He thought the symptoms would go away.  Patient carried on for the rest of his day.  Did not have any slurred speech.  Not have any falls.  He let his wife know and she called the doctor the next day on Tuesday.  When the doctor called him back on Wednesday, the patient still having symptoms.  PCP recommended patient go to the hospital ER for evaluation.  On arrival to ER temp was 98.1 heart rate 87 blood pressure 139/77 sats 98%.  Lab evaluation showed serum potassium 3.0.  Glucose 117.  BUN of 11.  Creatinine 0.88.  Hemoglobin of 13.8.  MRI of the brain with and without contrast demonstrated 1.2 cm focus of diffusion abnormality involving the left pons consistent with acute ischemic infarct.  No hemorrhage or mass-effect.  EKG demonstrated sinus bradycardia.  There is nonspecific ST-T wave changes.  Due to the acute ischemic stroke noted on MRI, [facility] contacted for admission.  Patient out of tPA window due to his symptoms starting on February 17, 2021.   ED Course: labwork unremarkable except for hypokalemia. MRI brain showed pontine stroke.  Review of Systems:  Review of Systems  Constitutional: Negative.   HENT: Negative.    Eyes: Negative.   Respiratory:  Positive for hemoptysis.        White sputum production for the last week. No fever. No  chills  Cardiovascular: Negative.   Gastrointestinal: Negative.   Genitourinary: Negative.   Musculoskeletal: Negative.   Skin: Negative.   Neurological:  Positive for tingling and focal weakness.       Right lower leg weakness. Unable to bear weight on right leg Numbness on inner right thigh  Endo/Heme/Allergies: Negative.   Psychiatric/Behavioral: Negative.    All other systems reviewed and are negative.  Past Medical History:  Diagnosis Date   BPH (benign prostatic hyperplasia)    COPD (chronic obstructive pulmonary disease) (HCC)    Diabetes mellitus without complication (HCC)    GERD (gastroesophageal reflux disease)    Hypertension    Male erectile disorder    Meniere's disease    Mixed hyperlipidemia    Nontoxic single thyroid nodule    Somnolence    Thoracic aortic aneurysm    per CT chest 08/29/20 in [facility] - stable diffuse aneurysmal dilation of the descending thoracic aorta with associated significant atheroschlerosis. 3.8 cm in maximum diameter.    Past Surgical History:  Procedure Laterality Date   CATARACT EXTRACTION Bilateral    ELBOW SURGERY Right      reports that he has been smoking cigarettes. He has been smoking an average of 2 packs per day. He has never used smokeless tobacco. He reports current alcohol use. He reports that he does not use drugs.  Allergies  Allergen Reactions   Gabapentin Other (See Comments)  Adverse reaction   Sertraline Other (See Comments)    Adverse reaction   Metformin Diarrhea and Nausea Only   Pineapple Hives   Pravastatin Other (See Comments)   Lorazepam Other (See Comments)    Shakiness    Family History  Problem Relation Age of Onset   Heart attack Mother    Diabetes Father    COPD Father    Diverticulitis Brother     Prior to Admission medications   Medication Sig Start Date End Date Taking? Authorizing Provider  acetaminophen (TYLENOL) 500 MG tablet Take 1 tablet (500 mg total) by mouth every 6 (six) hours  as needed. 05/13/17  Yes Waynetta Pean, PA-C  albuterol (PROVENTIL) (2.5 MG/3ML) 0.083% nebulizer solution Inhale 2.5 mg into the lungs every 6 (six) hours as needed for shortness of breath or wheezing.   Yes [provider]  albuterol (VENTOLIN HFA) 108 (90 Base) MCG/ACT inhaler Inhale 1-2 puffs into the lungs every 6 (six) hours as needed for shortness of breath or wheezing.   Yes [provider]  amLODipine (NORVASC) 10 MG tablet Take 10 mg by mouth daily. 11/29/20  Yes [provider]  Aspirin-Salicylamide-Caffeine (BC HEADACHE POWDER PO) Take 1 packet by mouth 2 (two) times daily as needed (headache).   Yes [provider]  atorvastatin (LIPITOR) 40 MG tablet TAKE 1 TABLET BY MOUTH  DAILY Patient taking differently: Take 40 mg by mouth daily. 02/13/21  Yes Cox, Kirsten, MD  Blood Glucose Monitoring Suppl (ONE TOUCH ULTRA 2) w/Device KIT Use daily to check blood sugar E11.9 02/05/20  Yes Cox, Kirsten, MD  Budeson-Glycopyrrol-Formoterol (BREZTRI AEROSPHERE) 160-9-4.8 MCG/ACT AERO Inhale 1 puff into the lungs in the morning and at bedtime.   Yes [provider]  Cyanocobalamin (VITAMIN B-12) 2500 MCG SUBL Place under the tongue.   Yes [provider]  eszopiclone (LUNESTA) 1 MG TABS tablet Take 1 mg by mouth at bedtime. 12/31/20  Yes [provider]  finasteride (PROSCAR) 5 MG tablet Take 1 tablet (5 mg total) by mouth at bedtime. 05/21/20  Yes Lillard Anes, MD  fluticasone (FLONASE) 50 MCG/ACT nasal spray USE 2 SPRAY(S) IN EACH NOSTRIL ONCE DAILY AS NEEDED FOR ALLERGIES OR RHIITIS Patient taking differently: Place 2 sprays into both nostrils daily as needed for allergies. 09/03/20  Yes Marge Duncans, PA-C  glucose blood test strip Use new test strip each time when checking blood sugar 02/21/20  Yes Cox, Kirsten, MD  Melatonin 10 MG TABS Take 10 mg by mouth at bedtime as needed (sleep).   Yes [provider]  metFORMIN  (GLUCOPHAGE) 500 MG tablet Take 500 mg by mouth daily. 11/29/20  Yes [provider]  omeprazole (PRILOSEC) 40 MG capsule TAKE 1 CAPSULE BY MOUTH  DAILY Patient taking differently: Take 40 mg by mouth daily. 06/28/20  Yes Cox, Kirsten, MD  OneTouch Delica Lancets 62E MISC Use a new lancet each time when checking blood sugar 02/21/20  Yes Cox, Kirsten, MD  sildenafil (VIAGRA) 100 MG tablet TAKE 1 TABLET BY MOUTH AS NEEDED APPROXIMATELY 1 HOUR PRIOR TO SEXUAL ACTIVITY Patient taking differently: Take 100 mg by mouth as needed for erectile dysfunction. 10/18/20  Yes Cox, Kirsten, MD  tamsulosin (FLOMAX) 0.4 MG CAPS capsule TAKE 1 CAPSULE BY MOUTH  DAILY 12/12/20  Yes Lillard Anes, MD  topiramate (TOPAMAX) 25 MG tablet Take 75 mg by mouth at bedtime. 01/11/21  Yes [provider]  amLODipine (NORVASC) 5 MG tablet TAKE 1  TABLET BY MOUTH  DAILY Patient not taking: No sig reported 02/13/21   Cox, Elnita Maxwell, MD  buPROPion [facility] SR) 150 MG 12 hr tablet Take 1 tablet (150 mg total) by mouth 2 (two) times daily. Patient not taking: No sig reported 12/07/19   Maryruth Hancock, MD  cephALEXin (KEFLEX) 500 MG capsule Take 1 capsule (500 mg total) by mouth 2 (two) times daily. Patient not taking: No sig reported 10/17/20   Cox, Elnita Maxwell, MD  cyanocobalamin (,VITAMIN B-12,) 1000 MCG/ML injection Inject 1 mL (1,000 mcg total) into the muscle once a week. Patient not taking: No sig reported 08/17/20   Rip Harbour, NP  DULoxetine (CYMBALTA) 60 MG capsule TAKE 1 CAPSULE BY MOUTH  ONCE DAILY Patient not taking: No sig reported 06/28/20   Cox, Elnita Maxwell, MD  losartan (COZAAR) 100 MG tablet TAKE 1 TABLET BY MOUTH  DAILY Patient not taking: No sig reported 06/28/20   CoxElnita Maxwell, MD  metoprolol succinate (TOPROL-XL) 25 MG 24 hr tablet Take 25 mg by mouth daily.  Patient not taking: No sig reported 03/07/19   [provider]  sitaGLIPtin (JANUVIA) 100 MG tablet Take 1 tablet (100 mg total) by  mouth daily. Patient not taking: No sig reported 09/18/20   Cox, Kirsten, MD  tiZANidine (ZANAFLEX) 4 MG tablet TAKE 1 TABLET BY MOUTH THREE TIMES DAILY Patient not taking: No sig reported 11/25/20   Cox, Elnita Maxwell, MD  traZODone (DESYREL) 50 MG tablet TAKE 1 TABLET BY MOUTH AT  BEDTIME AS NEEDED FOR SLEEP Patient not taking: No sig reported 11/20/20   Rochel Brome, MD    Physical Exam: Vitals:   02/18/21 1532 02/18/21 1827 02/18/21 2037 02/19/21 0258  BP: (!) 160/83 (!) 175/87 (!) 174/102 (!) 146/93  Pulse: (!) 55 87 (!) 56 90  Resp: _0 Temp:   (!) 97.5 F (36.4 C)   TempSrc:   Oral   SpO2: 100% 97% 99% 97%    Physical Exam Vitals and nursing note reviewed.  Constitutional:      General: He is not in acute distress.    Appearance: Normal appearance. He is normal weight. He is not ill-appearing, toxic-appearing or diaphoretic.  HENT:     Head: Normocephalic and atraumatic.     Nose: Nose normal. No rhinorrhea.  Eyes:     General:        Right eye: No discharge.     Extraocular Movements: Extraocular movements intact.     Pupils: Pupils are equal, round, and reactive to light.  Cardiovascular:     Rate and Rhythm: Regular rhythm. Bradycardia present.     Pulses: Normal pulses.     Heart sounds: No murmur heard.   No gallop.  Pulmonary:     Effort: Pulmonary effort is normal. No respiratory distress.     Breath sounds: Normal breath sounds. No wheezing or rales.  Abdominal:     General: Abdomen is flat. Bowel sounds are normal. There is no distension.     Palpations: Abdomen is soft.     Tenderness: There is no abdominal tenderness. There is no guarding or rebound.  Musculoskeletal:     Right lower leg: No edema.     Left lower leg: No edema.  Skin:    General: Skin is warm and dry.     Capillary Refill: Capillary refill takes less than 2 seconds.  Neurological:     Mental Status: He is alert and oriented to person, place,  and time.     Cranial Nerves: No cranial  nerve deficit.     Coordination: Finger-Nose-Finger Test and Heel to [place] Test normal. Rapid alternating movements normal.     Comments: 4/5 right hip flexor weakness  Right elbow held in flexion. Wife states that this is chronic     Labs on Admission: I have personally reviewed following labs and imaging studies  CBC: Recent Labs  Lab 02/18/21 1221 02/18/21 1232  WBC 6.8  --   NEUTROABS 4.6  --   HGB 13.8 13.9  HCT 41.2 41.0  MCV 90.7  --   PLT 245  --    Basic Metabolic Panel: Recent Labs  Lab 02/18/21 1221 02/18/21 1232  NA 138 143  K 3.0* 3.1*  CL 108 107  CO2 21*  --   GLUCOSE 117* 113*  BUN 11 10  CREATININE 0.88 0.80  CALCIUM 9.1  --    GFR: CrCl cannot be calculated (Unknown ideal weight.). Liver Function Tests: Recent Labs  Lab 02/18/21 1221  AST 13*  ALT 13  ALKPHOS 58  BILITOT 1.0  PROT 6.9  ALBUMIN 3.7   No results for input(s): LIPASE, AMYLASE in the last 168 hours. No results for input(s): AMMONIA in the last 168 hours. Coagulation Profile: Recent Labs  Lab 02/18/21 1221  INR 1.2   Cardiac Enzymes: No results for input(s): CKTOTAL, CKMB, CKMBINDEX, TROPONINI in the last 168 hours. BNP (last 3 results) No results for input(s): PROBNP in the last 8760 hours. HbA1C: No results for input(s): HGBA1C in the last 72 hours. CBG: No results for input(s): GLUCAP in the last 168 hours. Lipid Profile: No results for input(s): CHOL, HDL, LDLCALC, TRIG, CHOLHDL, LDLDIRECT in the last 72 hours. Thyroid Function Tests: No results for input(s): TSH, T4TOTAL, FREET4, T3FREE, THYROIDAB in the last 72 hours. Anemia Panel: No results for input(s): VITAMINB12, FOLATE, FERRITIN, TIBC, IRON, RETICCTPCT in the last 72 hours. Urine analysis:    Component Value Date/Time   COLORURINE YELLOW 07/02/2006 0807   APPEARANCEUR Clear 07/02/2006 0807   LABSPEC > OR = 1.030 07/02/2006 0807   PHURINE 5.5 07/02/2006 0807   GLUCOSEU NEGATIVE 07/02/2006 0807    BILIRUBINUR negative 10/17/2020 1550   KETONESUR NEGATIVE 07/02/2006 0807   PROTEINUR Positive (A) 10/17/2020 1550   UROBILINOGEN 1.0 10/17/2020 1550   UROBILINOGEN 0.2 mg/dL 07/02/2006 0807   NITRITE negative 10/17/2020 1550   NITRITE Negative 07/02/2006 0807   LEUKOCYTESUR Large (3+) (A) 10/17/2020 1550    Radiological Exams on Admission: I have personally reviewed images MR Brain W and Wo Contrast  Result Date: 02/19/2021 CLINICAL DATA:  Initial evaluation for neuro deficit, stroke suspected. EXAM: MRI HEAD WITHOUT AND WITH CONTRAST TECHNIQUE: Multiplanar, multiecho pulse sequences of the brain and surrounding structures were obtained without and with intravenous contrast. CONTRAST:  81m GADAVIST GADOBUTROL 1 MMOL/ML IV SOLN COMPARISON:  None available. FINDINGS: Brain: Examination moderately to severely degraded by motion artifact. Diffuse prominence of the CSF containing spaces compatible with generalized cerebral atrophy. Patchy and confluent T2/FLAIR hyperintensity involving the periventricular and deep white matter of both cerebral hemispheres most consistent with chronic small vessel ischemic disease, moderately advanced in nature. Few scattered remote lacunar infarcts present about the basal ganglia/corona radiata. Small remote left cerebellar infarct. 1.2 cm focus of diffusion abnormality seen involving the left pons, consistent with an acute ischemic infarct (series 5, image 71). No associated hemorrhage or mass effect. No other visible foci of restricted diffusion to suggest  acute or subacute ischemia. Gray-white matter differentiation otherwise maintained. No other areas of remote cortical infarction. No acute intracranial hemorrhage. Single chronic microhemorrhage noted within the subcortical white matter left frontal lobe, of doubtful significance in isolation. 1.4 cm enhancing lesion seen along the posterior falx immediately posterior to the splenium, likely a small meningioma (series  28, image 33). No significant associated mass effect. No other visible mass lesion, mass effect or midline shift. No hydrocephalus or extra-axial fluid collection. Pituitary gland and suprasellar region grossly within normal limits. Midline structures intact. Vascular: Major intracranial vascular flow voids are grossly maintained on this motion degraded exam. Skull and upper cervical spine: Craniocervical junction grossly within normal limits. Bone marrow signal intensity normal. No scalp soft tissue abnormality. Sinuses/Orbits: Prior bilateral ocular lens replacement. Globes and orbital soft tissues demonstrate no acute abnormality. Paranasal sinuses are largely clear. No significant mastoid effusion. Other: None. IMPRESSION: 1. Motion degraded exam. 2. 1.2 cm acute ischemic nonhemorrhagic left pontine infarct. 3. Age-related cerebral atrophy with moderately advanced chronic microvascular ischemic disease. 4. 1.4 cm probable meningioma along the posterior falx. No associated mass effect. Electronically Signed   By: Jeannine Boga M.D.   On: 02/19/2021 02:00    EKG: I have personally reviewed EKG: sinus rhythm. Nonspecific ST-T changes    Assessment/Plan Principal Problem:   Left pontine CVA (HCC) Active Problems:   Essential hypertension   Mixed hyperlipidemia   Diabetes mellitus without complication ([place])   Other emphysema ([place])   Tobacco abuse    Left pontine CVA ([place]) Admit to observation telemetry bed. Stroke neurology team consulted by EDP. Check bubble echo. Check carotid dopplers. Asa and plavix. Check lipid panel. ST/OT/PT consults.  Essential hypertension Pt not taking his BP meds as prescribed. Allow for permissive HTN for next 24-48 hours. Only treat if SBP >180.  Mixed hyperlipidemia Check lipid panel. Continue lipitor 40 mg daily.  Diabetes mellitus without complication (HCC) Check A1c. Add SSI during hospital stay.  Other emphysema ([place]) Not currently exacerbated.  Continue with inhalers.  Tobacco abuse Advised to stop smoking. Nicotine patch.  DVT prophylaxis: Lovenox Code Status: Full Code Family Communication: discussed with pt's wife at bedside  Disposition Plan: return home  Consults called: neurology consulted by EDP  Admission status: Observation, Telemetry bed   Kristopher Oppenheim, DO [facility] 02/19/2021, 3:41 AM

## 2021-02-19 NOTE — Progress Notes (Addendum)
STROKE TEAM PROGRESS NOTE   ATTENDING NOTE: I reviewed above note and agree with the assessment and plan. Pt was seen and examined.   75 year old male with history of diabetes, hypertension, hyperlipidemia, Mnire's disease admitted for dizziness, lightheadedness, unsteady gait, right lower extremity numbness and weakness.  MRI showed left pontine infarct.  CT head and neck intracranial atherosclerosis but no severe stenosis.  EF 60 to 65%.  LDL 58, A1c 5.8.  Creatinine 0.80.  On exam, patient sitting in chair, wife and daughter at bedside.  He is awake alert, orientated x3.  No aphasia, mild dysarthria, follows simple commands, able to name and repeat.  No gaze palsy, visual field fall.  Right nasolabial fold flattening, mild facial droop.  Tongue midline.  Left upper extremity 5/5.  Right upper extremity proximal 5 -/5, right elbow fixed due to previous injury.  Right hand grip 5 -/5, slightly decreased finger dexterity.  Left lower extremity 5/5, right lower extremity proximal 4+/5, distal DF/PF 5 -/5.  Finger-to-nose slow around right.  Sensation symmetrical.  Etiology for patient stroke likely due to small vessel disease.  Patient educated on smoking cessation.  Continue aggressive stroke risk factor modification.  On aspirin and Plavix DAPT for 3 weeks and then aspirin alone.  Continue Lipitor 40.  PT/OT recommend CIR.  Patient will continue follow-up with Dr. Felecia Shelling at [facility].  For detailed assessment and plan, please refer to above as I have made changes wherever appropriate.   Neurology will sign off. Please call with questions. Pt will follow up with Dr. Felecia Shelling at [facility] in about 4 weeks. Thanks for the consult.   Rosalin Hawking, MD PhD Stroke Neurology 02/19/2021 6:51 PM    INTERVAL HISTORY His son is at the bedside.  Patient seen in hallway at ED. No complaints at this time.   Vitals:   02/19/21 1215 02/19/21 1230 02/19/21 1245 02/19/21 1300  BP: (!) 188/121 (!) 216/134 (!) 237/114 (!)  182/86  Pulse: (!) 57 61 65 (!) 57  Resp: 18 16 15 16   Temp:      TempSrc:      SpO2: 99% 99% 97% 98%   CBC:  Recent Labs  Lab 02/18/21 1221 02/18/21 1232  WBC 6.8  --   NEUTROABS 4.6  --   HGB 13.8 13.9  HCT 41.2 41.0  MCV 90.7  --   PLT 245  --    Basic Metabolic Panel:  Recent Labs  Lab 02/18/21 1221 02/18/21 1232  NA 138 143  K 3.0* 3.1*  CL 108 107  CO2 21*  --   GLUCOSE 117* 113*  BUN 11 10  CREATININE 0.88 0.80  CALCIUM 9.1  --     Lipid Panel:  Recent Labs  Lab 02/19/21 0700  CHOL 106  TRIG 81  HDL 32*  CHOLHDL 3.3  VLDL 16  LDLCALC 58    HgbA1c:  Recent Labs  Lab 02/19/21 0700  HGBA1C 5.8*   Urine Drug Screen: No results for input(s): LABOPIA, COCAINSCRNUR, LABBENZ, AMPHETMU, THCU, LABBARB in the last 168 hours.  Alcohol Level No results for input(s): ETH in the last 168 hours.  IMAGING past 24 hours CT ANGIO HEAD NECK W WO CM  Result Date: 02/19/2021 CLINICAL DATA:  Stroke follow-up EXAM: CT ANGIOGRAPHY HEAD AND NECK TECHNIQUE: Multidetector CT imaging of the head and neck was performed using the standard protocol during bolus administration of intravenous contrast. Multiplanar CT image reconstructions and MIPs were obtained to evaluate the vascular anatomy.  Carotid stenosis measurements (when applicable) are obtained utilizing NASCET criteria, using the distal internal carotid diameter as the denominator. CONTRAST:  74mL OMNIPAQUE IOHEXOL 350 MG/ML SOLN COMPARISON:  Brain MRI pained approximately 12 hours prior. FINDINGS: CT HEAD FINDINGS Brain: There is hypodensity in the left pons corresponding to the infarct seen on the prior brain MRI. There is no evidence of hemorrhage. There is no evidence of new territorial infarct. There is no acute intracranial hemorrhage or extra-axial fluid collection. There is moderate global parenchymal volume loss. Hypodensity in the subcortical and periventricular white matter likely reflects sequela of moderate  chronic white matter microangiopathy. The left para falcine meningioma is again seen. There is no other solid mass lesion. There is no midline shift. Vascular: See below. Skull: Normal. Negative for fracture or focal lesion. Sinuses and orbits: The paranasal sinuses are clear. Bilateral lens implants are in place. The globes and orbits are otherwise unremarkable. Review of the MIP images confirms the above findings CTA NECK FINDINGS Aortic arch: There is calcified atherosclerotic plaque of the aortic arch and the origins of the great vessels without hemodynamically significant stenosis. There is a common origin of the right brachiocephalic and left common carotid arteries, a normal variant. Right carotid system: There is mild soft and calcified atherosclerotic plaque in the proximal right internal carotid artery without hemodynamically limiting stenosis or occlusion. There is no dissection or aneurysm. The right internal carotid artery has a tortuous course in the neck. Left carotid system: There is soft and calcified atherosclerotic plaque in the proximal left internal carotid artery without hemodynamically significant stenosis by NASCET criteria. There is no dissection or aneurysm. Vertebral arteries: The vertebral arteries are patent, with no significant stenosis, occlusion, dissection, or aneurysm. Skeleton: There is mild multilevel degenerative change of the cervical spine. There is no acute osseous abnormality or aggressive osseous lesion. Other neck: The soft tissues are unremarkable. Upper chest: There is severe emphysema in the lung apices. Review of the MIP images confirms the above findings CTA HEAD FINDINGS Anterior circulation: There is mild calcified atherosclerotic plaque in the bilateral intracranial carotid arteries without significant stenosis or occlusion. The bilateral MCAs and ACAs are patent. There is no aneurysm. Posterior circulation: The bilateral V4 segments are patent. PICA is seen  bilaterally. The basilar artery is patent. The bilateral PCAs are patent. Venous sinuses: Patent. Anatomic variants: The right posterior communicating artery is not definitively identified. Review of the MIP images confirms the above findings IMPRESSION: 1. Hypodensity in the left pons consistent with evolving infarct as seen on the prior brain MRI. No evidence of hemorrhage. 2. Patent intracranial vasculature with minimal atherosclerotic disease. Specifically, the basilar artery is widely patent. 3. Mild soft and calcified atherosclerotic plaque in the bilateral carotid bulbs without hemodynamically significant stenosis. Patent vertebral arteries. 4. Tortuosity of the right internal carotid artery may be due to hypertension. 5. Unchanged global parenchymal volume loss and chronic white matter microangiopathy. 6. Severe emphysema in the lung apices. Aortic Atherosclerosis (ICD10-I70.0) and Emphysema (ICD10-J43.9). Electronically Signed   By: Valetta Mole M.D.   On: 02/19/2021 10:31   MR Brain W and Wo Contrast  Result Date: 02/19/2021 CLINICAL DATA:  Initial evaluation for neuro deficit, stroke suspected. EXAM: MRI HEAD WITHOUT AND WITH CONTRAST TECHNIQUE: Multiplanar, multiecho pulse sequences of the brain and surrounding structures were obtained without and with intravenous contrast. CONTRAST:  27mL GADAVIST GADOBUTROL 1 MMOL/ML IV SOLN COMPARISON:  None available. FINDINGS: Brain: Examination moderately to severely degraded by motion artifact.  Diffuse prominence of the CSF containing spaces compatible with generalized cerebral atrophy. Patchy and confluent T2/FLAIR hyperintensity involving the periventricular and deep white matter of both cerebral hemispheres most consistent with chronic small vessel ischemic disease, moderately advanced in nature. Few scattered remote lacunar infarcts present about the basal ganglia/corona radiata. Small remote left cerebellar infarct. 1.2 cm focus of diffusion abnormality  seen involving the left pons, consistent with an acute ischemic infarct (series 5, image 71). No associated hemorrhage or mass effect. No other visible foci of restricted diffusion to suggest acute or subacute ischemia. Gray-white matter differentiation otherwise maintained. No other areas of remote cortical infarction. No acute intracranial hemorrhage. Single chronic microhemorrhage noted within the subcortical white matter left frontal lobe, of doubtful significance in isolation. 1.4 cm enhancing lesion seen along the posterior falx immediately posterior to the splenium, likely a small meningioma (series 28, image 33). No significant associated mass effect. No other visible mass lesion, mass effect or midline shift. No hydrocephalus or extra-axial fluid collection. Pituitary gland and suprasellar region grossly within normal limits. Midline structures intact. Vascular: Major intracranial vascular flow voids are grossly maintained on this motion degraded exam. Skull and upper cervical spine: Craniocervical junction grossly within normal limits. Bone marrow signal intensity normal. No scalp soft tissue abnormality. Sinuses/Orbits: Prior bilateral ocular lens replacement. Globes and orbital soft tissues demonstrate no acute abnormality. Paranasal sinuses are largely clear. No significant mastoid effusion. Other: None. IMPRESSION: 1. Motion degraded exam. 2. 1.2 cm acute ischemic nonhemorrhagic left pontine infarct. 3. Age-related cerebral atrophy with moderately advanced chronic microvascular ischemic disease. 4. 1.4 cm probable meningioma along the posterior falx. No associated mass effect. Electronically Signed   By: Jeannine Boga M.D.   On: 02/19/2021 02:00   ECHOCARDIOGRAM COMPLETE  Result Date: 02/19/2021    ECHOCARDIOGRAM REPORT   Patient Name:   Wayne [name] Date of Exam: 02/19/2021 Medical Rec #:  [id number]           Height:       67.0 in Accession #:    [id number]          Weight:        189.0 lb Date of Birth:  1945/11/25           BSA:          1.974 m Patient Age:    52 years            BP:           146/93 mmHg Patient Gender: M                   HR:           71 bpm. Exam Location:  Inpatient Procedure: 2D Echo, Color Doppler and Cardiac Doppler Indications:    CVA  History:        Patient has no prior history of Echocardiogram examinations.                 CAD; Risk Factors:Hypertension, Dyslipidemia, emphysema and                 Current Smoker.  Sonographer:    Melissa Morford RDCS (AE, PE) Referring Phys: [place]  1. Left ventricular ejection fraction, by estimation, is 60 to 65%. The left ventricle has normal function. The left ventricle has no regional wall motion abnormalities. There is mild left ventricular hypertrophy. Left ventricular diastolic parameters are consistent with Grade I diastolic dysfunction (  impaired relaxation).  2. Right ventricular systolic function is normal. The right ventricular size is normal. Tricuspid regurgitation signal is inadequate for assessing PA pressure.  3. A small pericardial effusion is present. The pericardial effusion is anterior to the right ventricle. There is no evidence of cardiac tamponade.  4. The mitral valve is normal in structure. No evidence of mitral valve regurgitation.  5. The aortic valve is tricuspid. Aortic valve regurgitation is not visualized. Mild aortic valve sclerosis is present, with no evidence of aortic valve stenosis.  6. The inferior vena cava is normal in size with greater than 50% respiratory variability, suggesting right atrial pressure of 3 mmHg. Comparison(s): No prior Echocardiogram. FINDINGS  Left Ventricle: Left ventricular ejection fraction, by estimation, is 60 to 65%. The left ventricle has normal function. The left ventricle has no regional wall motion abnormalities. The left ventricular internal cavity size was normal in size. There is  mild left ventricular hypertrophy. Left ventricular  diastolic parameters are consistent with Grade I diastolic dysfunction (impaired relaxation). Indeterminate filling pressures. Right Ventricle: The right ventricular size is normal. No increase in right ventricular wall thickness. Right ventricular systolic function is normal. Tricuspid regurgitation signal is inadequate for assessing PA pressure. Left Atrium: Left atrial size was normal in size. Right Atrium: Right atrial size was normal in size. Pericardium: A small pericardial effusion is present. The pericardial effusion is anterior to the right ventricle. The pericardial effusion appears to contain mixed echogenic material. There is no evidence of cardiac tamponade. Mitral Valve: The mitral valve is normal in structure. No evidence of mitral valve regurgitation. Tricuspid Valve: The tricuspid valve is grossly normal. Tricuspid valve regurgitation is trivial. Aortic Valve: The aortic valve is tricuspid. Aortic valve regurgitation is not visualized. Mild aortic valve sclerosis is present, with no evidence of aortic valve stenosis. Pulmonic Valve: The pulmonic valve was normal in structure. Pulmonic valve regurgitation is not visualized. Aorta: The aortic root and ascending aorta are structurally normal, with no evidence of dilitation. Venous: The inferior vena cava is normal in size with greater than 50% respiratory variability, suggesting right atrial pressure of 3 mmHg. IAS/Shunts: No atrial level shunt detected by color flow Doppler.  LEFT VENTRICLE PLAX 2D LVIDd:         4.83 cm      Diastology LVIDs:         3.25 cm      LV e' medial:    4.03 cm/s LV PW:         0.97 cm      LV E/e' medial:  17.5 LV IVS:        1.03 cm      LV e' lateral:   5.33 cm/s LVOT diam:     2.20 cm      LV E/e' lateral: 13.2 LV SV:         70 LV SV Index:   36 LVOT Area:     3.80 cm  LV Volumes (MOD) LV vol d, MOD A2C: 101.0 ml LV vol d, MOD A4C: 113.0 ml LV vol s, MOD A2C: 52.6 ml LV vol s, MOD A4C: 46.7 ml LV SV MOD A2C:     48.4  ml LV SV MOD A4C:     113.0 ml LV SV MOD BP:      54.7 ml RIGHT VENTRICLE TAPSE (M-mode): 1.1 cm LEFT ATRIUM             Index  RIGHT ATRIUM           Index LA diam:        3.75 cm 1.90 cm/m  RA Area:     14.00 cm LA Vol (A2C):   38.1 ml 19.30 ml/m RA Volume:   36.60 ml  18.54 ml/m LA Vol (A4C):   52.6 ml 26.64 ml/m LA Biplane Vol: 45.5 ml 23.05 ml/m  AORTIC VALVE LVOT Vmax:   106.00 cm/s LVOT Vmean:  60.100 cm/s LVOT VTI:    0.185 m  AORTA Ao Root diam: 3.27 cm Ao Asc diam:  3.50 cm MITRAL VALVE MV Area (PHT): 3.60 cm     SHUNTS MV Decel Time: 211 msec     Systemic VTI:  0.18 m MV E velocity: 70.40 cm/s   Systemic Diam: 2.20 cm MV A velocity: 106.00 cm/s MV E/A ratio:  0.66 Lyman Bishop MD Electronically signed by Lyman Bishop MD Signature Date/Time: 02/19/2021/12:57:07 PM    Final     PHYSICAL EXAM  Neurological Examination Mental Status: Alert, fully oriented, thought content appropriate.  Speech fluent without evidence of aphasia, except for impaired repetition.  Able to follow all commands without difficulty. Cranial Nerves: II: Temporal visual fields intact with no extinction to DSS. PERRL.   III,IV, VI: No ptosis. EOMI with saccadic quality of pursuits noted.  V,VII: Right facial droop VIII: hearing intact to voice IX,X: No hypophonia XI: Head is midline XII: Midline tongue extension Motor: RUE: Decreased ROM right elbow from remote injury. In this context there is intact grip strength, 4/5 biceps strength within decreased ROM, 3/5 triceps, 4/5 deltoid LUE 5/5 RLE 4/5  LLE 5/5 Sensory: Decreased FT and Temp sensation on the right Deep Tendon Reflexes: 2+ and symmetric throughout Cerebellar: No ataxia with FNF bilaterally  Gait: Unsteady gait  ASSESSMENT/PLAN Wayne [name] is a 75 y.o. male with history of   PMHx of COPD, DM, HTN, erectile dysfunction, Meniere's disease, HLD and thoracid aortic aneurysm who presented to the ED this morning with new onset of  dizziness in conjunction with numbness and weakness of his LLE. LKN was 0100 on Monday. BP in Triage was 122/90 but has remained markeldy elevated throughout hospitalization.    MRI brain reveals an acute left pontine lacunar infarction.     Stroke:  left pontine lacunar infarct embolic secondary to small vessel disease     CTA head & neck   2. Patent intracranial vasculature with minimal atherosclerotic disease. Specifically, the basilar artery is widely patent. 3. Mild soft and calcified atherosclerotic plaque in the bilateral carotid bulbs without hemodynamically significant stenosis. Patent vertebral arteries. 4. Tortuosity of the right internal carotid artery may be due to hypertension. 5. Unchanged global parenchymal volume loss and chronic white matter microangiopathy. 6. Severe emphysema in the lung apices. MRI  Brain:  2. 1.2 cm acute ischemic nonhemorrhagic left pontine infarct. 3. Age-related cerebral atrophy with moderately advanced chronic microvascular ischemic disease. 4. 1.4 cm probable meningioma along the posterior falx. No associated mass effect. 2D Echo EF 60-65%, mild LVH, normal LA size, no IA shunt     LDL 58 HgbA1c 5.8 VTE prophylaxis - scd     Diet   Diet NPO time specified   No antithrombotic prior to admission, now on aspirin 81 mg daily and clopidogrel 75 mg daily DAPT for 3 weeks and then ASA alone.   Therapy recommendations:  pending  Disposition:  pending  Hypertension Home meds:   amlodipine, losartan Unstable  OK to gradual normalize BP  Resume home meds Long-term BP goal normotensive  Hyperlipidemia Home meds:  atrovastatin 40mg ,  resumed in hospital LDL 58, goal < 70 Continue statin at discharge  Diabetes type II Controlled Home meds:  metformin 500mg  daily, januvia 100mg  daily,  HgbA1c 5.8, goal < 7.0 CBGs SSI  Tobacco abuse Current smoker Smoking cessation counseling provided Nicotine patch provided Pt is willing to  quit  Other Stroke Risk Factors  Advanced Age >/= 32   Coronary artery disease     Hospital day # 0     To contact Stroke Continuity provider, please refer to [email or web address] After hours, contact General Neurology

## 2021-02-19 NOTE — ED Notes (Signed)
Pt placed on hospital bed for comfort, visitor bedside as well.

## 2021-02-19 NOTE — Assessment & Plan Note (Signed)
Advised to stop smoking. Nicotine patch.

## 2021-02-19 NOTE — Assessment & Plan Note (Signed)
Check lipid panel. Continue lipitor 40 mg daily.

## 2021-02-19 NOTE — Progress Notes (Addendum)
Patient was admitted this morning with concern for left pontine stroke.  MRI brain concerning for the same.  Stroke work-up in progress.  CT angiogram and echocardiogram is pending.  Seen patient in the hallway with the son.  Continue aspirin atorvastatin.  Consulted speech PT and OT Hypokalemia repleted.  Check magnesium level.  Follow-up labs in AM. CT angiogram of the head and neck this morning-Hypodensity in the left pons consistent with evolving infarct as seen on the prior brain MRI. No evidence of hemorrhage. Patent intracranial vasculature with minimal atherosclerotic disease. Specifically, the basilar artery is widely patent.  Mild soft and calcified atherosclerotic plaque in the bilateral carotid bulbs without hemodynamically significant stenosis. Patent vertebral arteries. Tortuosity of the right internal carotid artery may be due to hypertension.  Unchanged global parenchymal volume loss and chronic white matter microangiopathy. Severe emphysema in the lung apices.

## 2021-02-19 NOTE — ED Notes (Signed)
Kassie with [employer] [phone] requesting to speak to nurse or doctor case [phone]

## 2021-02-19 NOTE — ED Notes (Signed)
The pt is hard of hearing  wife at the bedside

## 2021-02-19 NOTE — Subjective & Objective (Signed)
CC: right leg weakness HPI: 75 year old white male with a history of COPD, hypertension, type 2 diabetes, hyperlipidemia, tobacco abuse presents the ER today at the behest of his family doctor.  Patient states that on Monday afternoon February 17, 2021, about 12 PM, patient was outside in his yard doing some yard work.  He had a sudden loss of strength in his right leg.  He also felt numb in his right leg.  He thought the symptoms would go away.  Patient carried on for the rest of his day.  Did not have any slurred speech.  Not have any falls.  He let his wife know and she called the doctor the next day on Tuesday.  When the doctor called him back on Wednesday, the patient still having symptoms.  PCP recommended patient go to the hospital ER for evaluation.  On arrival to ER temp was 98.1 heart rate 87 blood pressure 139/77 sats 98%.  Lab evaluation showed serum potassium 3.0.  Glucose 117.  BUN of 11.  Creatinine 0.88.  Hemoglobin of 13.8.  MRI of the brain with and without contrast demonstrated 1.2 cm focus of diffusion abnormality involving the left pons consistent with acute ischemic infarct.  No hemorrhage or mass-effect.  EKG demonstrated sinus bradycardia.  There is nonspecific ST-T wave changes.  Due to the acute ischemic stroke noted on MRI, [facility] hospitalist contacted for admission.  Patient out of tPA window due to his symptoms starting on February 17, 2021.

## 2021-02-19 NOTE — Assessment & Plan Note (Signed)
Pt not taking his BP meds as prescribed. Allow for permissive HTN for next 24-48 hours. Only treat if SBP >180.

## 2021-02-19 NOTE — ED Notes (Signed)
Admitting provider bedside 

## 2021-02-19 NOTE — Assessment & Plan Note (Signed)
Check A1c. Add SSI during hospital stay.

## 2021-02-19 NOTE — Assessment & Plan Note (Signed)
Not currently exacerbated. Continue with inhalers.

## 2021-02-19 NOTE — Evaluation (Signed)
Physical Therapy Evaluation Patient Details Name: Wayne [name] MRN: [id number] DOB: 1945-09-05 Today's Date: 02/19/2021  History of Present Illness  75 year old white male with a history of COPD, DM, HTN, erectile dysfunction, Meniere's disease, HLD, thoracic aortic aneurysm and tobacco abuse admitted due to R LE weakness and numbness found to have L pontine infarct.  Patient with chronic R UE elbow contracture.  Clinical Impression  Patient presents with decreased mobility due to weakness on the R LE, decreased sensation with prior history of vestibular dysfunction with Meniere's disease reportedly why he used a cane now presenting  with high fall risk and poor safety awareness.  Son present reports pt's wife can provide supervision, but no physical help.  Feel he will benefit from follow up CIR level rehab prior to d/c home if pt moves to inpatient status.  PT will follow acutely.   Post activity vitals: HR 84, SpO2 97% on RA, BP 179/89     Recommendations for follow up therapy are one component of a multi-disciplinary discharge planning process, led by the attending physician.  Recommendations may be updated based on patient status, additional functional criteria and insurance authorization.  Follow Up Recommendations CIR    Equipment Recommendations  Other (comment) (TBA)    Recommendations for Other Services       Precautions / Restrictions Precautions Precautions: Fall      Mobility  Bed Mobility Overal bed mobility: Needs Assistance Bed Mobility: Supine to Sit;Sit to Supine     Supine to sit: Supervision;HOB elevated Sit to supine: Supervision   General bed mobility comments: assist for safety and increased time needed    Transfers Overall transfer level: Needs assistance Equipment used: Straight cane Transfers: Sit to/from Stand Sit to Stand: Min assist         General transfer comment: for balance and safety  Ambulation/Gait Ambulation/Gait  assistance: Mod assist Gait Distance (Feet): 90 Feet Assistive device: Straight cane Gait Pattern/deviations: Step-to pattern;Step-through pattern;Decreased stride length;Drifts right/left     General Gait Details: R LE buckling initially, then able to ambulate with mod A for balance and safety as pt initially pitching backward with A behind L shoulder for controlling posterior bias and at R hip for preventing knee buckling  Stairs            Wheelchair Mobility    Modified Rankin (Stroke Patients Only) Modified Rankin (Stroke Patients Only) Pre-Morbid Rankin Score: No significant disability Modified Rankin: Moderately severe disability     Balance Overall balance assessment: Needs assistance   Sitting balance-Leahy Scale: Good     Standing balance support: Single extremity supported Standing balance-Leahy Scale: Poor Standing balance comment: min to mod A for initial standing balance with cane                             Pertinent Vitals/Pain Pain Assessment: No/denies pain    Home Living Family/patient expects to be discharged to:: Private residence Living Arrangements: Spouse/significant other Available Help at Discharge: Family;Available 24 hours/day Type of Home: Mobile home Home Access: Stairs to enter Entrance Stairs-Rails: Psychiatric nurse of Steps: 8 Home Layout: One level Home Equipment: Cane - single point      Prior Function Level of Independence: Independent with assistive device(s)         Comments: used cane PTA     Hand Dominance   Dominant Hand: Left    Extremity/Trunk Assessment   Upper Extremity Assessment  Upper Extremity Assessment: RUE deficits/detail RUE Deficits / Details: chronic R elbow flexion contracture no extension past 90 degrees, shoulder WFL, grip weaker compared to L RUE Sensation: decreased light touch    Lower Extremity Assessment Lower Extremity Assessment: RLE deficits/detail RLE  Deficits / Details: AROM WFL, strength hip flexion 3+/5, knee extension 4/5, numbness in foot RLE Sensation: decreased light touch    Cervical / Trunk Assessment Cervical / Trunk Assessment: Kyphotic  Communication   Communication: HOH  Cognition Arousal/Alertness: Awake/alert Behavior During Therapy: WFL for tasks assessed/performed Overall Cognitive Status: Impaired/Different from baseline Area of Impairment: Problem solving;Safety/judgement;Memory                     Memory: Decreased short-term memory   Safety/Judgement: Decreased awareness of safety   Problem Solving: Slow processing General Comments: son answering for pt and reports different home set up and mobility than pt      General Comments General comments (skin integrity, edema, etc.): son present and frustrated with 30 hours in hallway in ED and no one can tell him when they will get a room    Exercises     Assessment/Plan    PT Assessment Patient needs continued PT services  PT Problem List Decreased strength;Decreased mobility;Decreased safety awareness;Decreased balance;Decreased knowledge of use of DME;Impaired sensation;Decreased cognition;Decreased coordination       PT Treatment Interventions DME instruction;Therapeutic activities;Patient/family education;Therapeutic exercise;Gait training;Cognitive remediation;Stair training;Functional mobility training;Balance training    PT Goals (Current goals can be found in the Care Plan section)  Acute Rehab PT Goals Patient Stated Goal: to go home after rehab PT Goal Formulation: With patient/family Time For Goal Achievement: 03/05/21 Potential to Achieve Goals: Good    Frequency Min 4X/week   Barriers to discharge        Co-evaluation               AM-PAC PT "6 Clicks" Mobility  Outcome Measure Help needed turning from your back to your side while in a flat bed without using bedrails?: A Little Help needed moving from lying on your  back to sitting on the side of a flat bed without using bedrails?: A Little Help needed moving to and from a bed to a chair (including a wheelchair)?: A Little Help needed standing up from a chair using your arms (e.g., wheelchair or bedside chair)?: A Little Help needed to walk in hospital room?: A Lot Help needed climbing 3-5 steps with a railing? : Total 6 Click Score: 15    End of Session   Activity Tolerance: Patient tolerated treatment well Patient left: in bed;with call bell/phone within reach Nurse Communication: Mobility status PT Visit Diagnosis: Other abnormalities of gait and mobility (R26.89);Other symptoms and signs involving the nervous system (R29.898);Muscle weakness (generalized) (M62.81);Hemiplegia and hemiparesis Hemiplegia - Right/Left: Right Hemiplegia - dominant/non-dominant: Non-dominant Hemiplegia - caused by: Cerebral infarction    Time: 3704-8889 PT Time Calculation (min) (ACUTE ONLY): 28 min   Charges:   PT Evaluation $PT Eval Moderate Complexity: 1 Mod PT Treatments $Gait Training: 8-22 mins        Magda Kiel, PT Acute Rehabilitation Services Pager:[phone] Office:[phone] 02/19/2021   Reginia Naas 02/19/2021, 5:05 PM

## 2021-02-19 NOTE — ED Notes (Signed)
Pt ambulated with cane and 1 assist with PT

## 2021-02-19 NOTE — Consult Note (Signed)
NEURO HOSPITALIST CONSULT NOTE   Requestig physician: Dr. Dina Rich  Reason for Consult: Acute left pontine ischemic infarction  History obtained from:  Patient and Chart     HPI:                                                                                                                                          EKAM BESSON Sr is an 75 y.o. male with a PMHx of COPD, DM, HTN, erectile dysfunction, Meniere's disease, HLD and thoracid aortic aneurysm who presented to the ED this morning with new onset of dizziness in conjunction with numbness and weakness of his LLE. LKN was 0100 on Monday. BP in Triage was 122/90.   MRI brain reveals an acute left pontine lacunar infarction.   Past Medical History:  Diagnosis Date   BPH (benign prostatic hyperplasia)    COPD (chronic obstructive pulmonary disease) (HCC)    Diabetes mellitus without complication (HCC)    GERD (gastroesophageal reflux disease)    Hypertension    Male erectile disorder    Meniere's disease    Mixed hyperlipidemia    Nontoxic single thyroid nodule    Somnolence    Thoracic aortic aneurysm    per CT chest 08/29/20 in Epic - stable diffuse aneurysmal dilation of the descending thoracic aorta with associated significant atheroschlerosis. 3.8 cm in maximum diameter.    Past Surgical History:  Procedure Laterality Date   CATARACT EXTRACTION Bilateral    ELBOW SURGERY Right     Family History  Problem Relation Age of Onset   Heart attack Mother    Diabetes Father    COPD Father    Diverticulitis Brother              Social History:  reports that he has been smoking cigarettes. He has been smoking an average of 2 packs per day. He has never used smokeless tobacco. He reports current alcohol use. He reports that he does not use drugs.  Allergies  Allergen Reactions   Gabapentin Other (See Comments)    Adverse reaction   Sertraline Other (See Comments)    Adverse reaction   Metformin  Diarrhea and Nausea Only   Pineapple Hives   Pravastatin Other (See Comments)   Lorazepam Other (See Comments)    Shakiness    MEDICATIONS:  No current facility-administered medications on file prior to encounter.   Current Outpatient Medications on File Prior to Encounter  Medication Sig Dispense Refill   acetaminophen (TYLENOL) 500 MG tablet Take 1 tablet (500 mg total) by mouth every 6 (six) hours as needed. 30 tablet 0   albuterol (PROVENTIL) (2.5 MG/3ML) 0.083% nebulizer solution Inhale 2.5 mg into the lungs every 6 (six) hours as needed for shortness of breath or wheezing.     albuterol (VENTOLIN HFA) 108 (90 Base) MCG/ACT inhaler Inhale 1-2 puffs into the lungs every 6 (six) hours as needed for shortness of breath or wheezing.     amLODipine (NORVASC) 10 MG tablet Take 10 mg by mouth daily.     Aspirin-Salicylamide-Caffeine (BC HEADACHE POWDER PO) Take 1 packet by mouth 2 (two) times daily as needed (headache).     atorvastatin (LIPITOR) 40 MG tablet TAKE 1 TABLET BY MOUTH  DAILY (Patient taking differently: Take 40 mg by mouth daily.) 90 tablet 3   Blood Glucose Monitoring Suppl (ONE TOUCH ULTRA 2) w/Device KIT Use daily to check blood sugar E11.9 1 kit 0   Budeson-Glycopyrrol-Formoterol (BREZTRI AEROSPHERE) 160-9-4.8 MCG/ACT AERO Inhale 1 puff into the lungs in the morning and at bedtime.     Cyanocobalamin (VITAMIN B-12) 2500 MCG SUBL Place under the tongue.     eszopiclone (LUNESTA) 1 MG TABS tablet Take 1 mg by mouth at bedtime.     finasteride (PROSCAR) 5 MG tablet Take 1 tablet (5 mg total) by mouth at bedtime. 90 tablet 2   fluticasone (FLONASE) 50 MCG/ACT nasal spray USE 2 SPRAY(S) IN EACH NOSTRIL ONCE DAILY AS NEEDED FOR ALLERGIES OR RHIITIS (Patient taking differently: Place 2 sprays into both nostrils daily as needed for allergies.) 16 g 0   glucose blood  test strip Use new test strip each time when checking blood sugar 100 each 2   Melatonin 10 MG TABS Take 10 mg by mouth at bedtime as needed (sleep).     metFORMIN (GLUCOPHAGE) 500 MG tablet Take 500 mg by mouth daily.     omeprazole (PRILOSEC) 40 MG capsule TAKE 1 CAPSULE BY MOUTH  DAILY (Patient taking differently: Take 40 mg by mouth daily.) 90 capsule 3   OneTouch Delica Lancets 63O MISC Use a new lancet each time when checking blood sugar 100 each 2   sildenafil (VIAGRA) 100 MG tablet TAKE 1 TABLET BY MOUTH AS NEEDED APPROXIMATELY 1 HOUR PRIOR TO SEXUAL ACTIVITY (Patient taking differently: Take 100 mg by mouth as needed for erectile dysfunction.) 10 tablet 2   tamsulosin (FLOMAX) 0.4 MG CAPS capsule TAKE 1 CAPSULE BY MOUTH  DAILY 90 capsule 3   topiramate (TOPAMAX) 25 MG tablet Take 75 mg by mouth at bedtime.     amLODipine (NORVASC) 5 MG tablet TAKE 1 TABLET BY MOUTH  DAILY (Patient not taking: No sig reported) 90 tablet 3   buPROPion (WELLBUTRIN SR) 150 MG 12 hr tablet Take 1 tablet (150 mg total) by mouth 2 (two) times daily. (Patient not taking: No sig reported) 60 tablet 1   cephALEXin (KEFLEX) 500 MG capsule Take 1 capsule (500 mg total) by mouth 2 (two) times daily. (Patient not taking: No sig reported) 20 capsule 0   cyanocobalamin (,VITAMIN B-12,) 1000 MCG/ML injection Inject 1 mL (1,000 mcg total) into the muscle once a week. (Patient not taking: No sig reported) 4 mL 1   DULoxetine (CYMBALTA) 60 MG capsule TAKE 1 CAPSULE BY MOUTH  ONCE DAILY (Patient  not taking: No sig reported) 90 capsule 3   losartan (COZAAR) 100 MG tablet TAKE 1 TABLET BY MOUTH  DAILY (Patient not taking: No sig reported) 90 tablet 3   metoprolol succinate (TOPROL-XL) 25 MG 24 hr tablet Take 25 mg by mouth daily.  (Patient not taking: No sig reported)     sitaGLIPtin (JANUVIA) 100 MG tablet Take 1 tablet (100 mg total) by mouth daily. (Patient not taking: No sig reported) 90 tablet 3   tiZANidine (ZANAFLEX) 4 MG  tablet TAKE 1 TABLET BY MOUTH THREE TIMES DAILY (Patient not taking: No sig reported) 90 tablet 0   traZODone (DESYREL) 50 MG tablet TAKE 1 TABLET BY MOUTH AT  BEDTIME AS NEEDED FOR SLEEP (Patient not taking: No sig reported) 90 tablet 3     ROS:                                                                                                                                       No chest pain or visual changes. Positive for difficulty walking and numbness of leg. Other ROS as per HPI.    Blood pressure (!) 174/102, pulse (!) 56, temperature (!) 97.5 F (36.4 C), temperature source Oral, resp. rate 18, SpO2 99 %.   General Examination:                                                                                                       Physical Exam  HEENT-  [place]esian/AT   Lungs- Respirations unlabored Extremities- Warm and well perfused. Decreased ROM right elbow from remote injury  Neurological Examination Mental Status: Alert, fully oriented, thought content appropriate.  Speech fluent without evidence of aphasia, except for impaired repetition.  Able to follow all commands without difficulty. Cranial Nerves: II: Temporal visual fields intact with no extinction to DSS. PERRL.   III,IV, VI: No ptosis. EOMI with saccadic quality of pursuits noted.  V,VII: Right facial droop VIII: hearing intact to voice IX,X: No hypophonia XI: Head is midline XII: Midline tongue extension Motor: RUE: Decreased ROM right elbow from remote injury. In this context there is intact grip strength, 4/5 biceps strength within decreased ROM, 3/5 triceps, 4/5 deltoid LUE 5/5 RLE 5/5  LLE 5/5 Sensory: Decreased FT and Temp sensation on the right Deep Tendon Reflexes: 2+ and symmetric throughout Cerebellar: No ataxia with FNF bilaterally  Gait: Unsteady gait   Lab Results: Basic Metabolic Panel: Recent Labs  Lab 02/18/21 1221 02/18/21 1232  NA  138 143  K 3.0* 3.1*  CL 108 107  CO2 21*  --   GLUCOSE 117*  113*  BUN 11 10  CREATININE 0.88 0.80  CALCIUM 9.1  --     CBC: Recent Labs  Lab 02/18/21 1221 02/18/21 1232  WBC 6.8  --   NEUTROABS 4.6  --   HGB 13.8 13.9  HCT 41.2 41.0  MCV 90.7  --   PLT 245  --     Cardiac Enzymes: No results for input(s): CKTOTAL, CKMB, CKMBINDEX, TROPONINI in the last 168 hours.  Lipid Panel: No results for input(s): CHOL, TRIG, HDL, CHOLHDL, VLDL, LDLCALC in the last 168 hours.  Imaging: MR Brain W and Wo Contrast  Result Date: 02/19/2021 CLINICAL DATA:  Initial evaluation for neuro deficit, stroke suspected. EXAM: MRI HEAD WITHOUT AND WITH CONTRAST TECHNIQUE: Multiplanar, multiecho pulse sequences of the brain and surrounding structures were obtained without and with intravenous contrast. CONTRAST:  46m GADAVIST GADOBUTROL 1 MMOL/ML IV SOLN COMPARISON:  None available. FINDINGS: Brain: Examination moderately to severely degraded by motion artifact. Diffuse prominence of the CSF containing spaces compatible with generalized cerebral atrophy. Patchy and confluent T2/FLAIR hyperintensity involving the periventricular and deep white matter of both cerebral hemispheres most consistent with chronic small vessel ischemic disease, moderately advanced in nature. Few scattered remote lacunar infarcts present about the basal ganglia/corona radiata. Small remote left cerebellar infarct. 1.2 cm focus of diffusion abnormality seen involving the left pons, consistent with an acute ischemic infarct (series 5, image 71). No associated hemorrhage or mass effect. No other visible foci of restricted diffusion to suggest acute or subacute ischemia. Gray-white matter differentiation otherwise maintained. No other areas of remote cortical infarction. No acute intracranial hemorrhage. Single chronic microhemorrhage noted within the subcortical white matter left frontal lobe, of doubtful significance in isolation. 1.4 cm enhancing lesion seen along the posterior falx immediately  posterior to the splenium, likely a small meningioma (series 28, image 33). No significant associated mass effect. No other visible mass lesion, mass effect or midline shift. No hydrocephalus or extra-axial fluid collection. Pituitary gland and suprasellar region grossly within normal limits. Midline structures intact. Vascular: Major intracranial vascular flow voids are grossly maintained on this motion degraded exam. Skull and upper cervical spine: Craniocervical junction grossly within normal limits. Bone marrow signal intensity normal. No scalp soft tissue abnormality. Sinuses/Orbits: Prior bilateral ocular lens replacement. Globes and orbital soft tissues demonstrate no acute abnormality. Paranasal sinuses are largely clear. No significant mastoid effusion. Other: None. IMPRESSION: 1. Motion degraded exam. 2. 1.2 cm acute ischemic nonhemorrhagic left pontine infarct. 3. Age-related cerebral atrophy with moderately advanced chronic microvascular ischemic disease. 4. 1.4 cm probable meningioma along the posterior falx. No associated mass effect. Electronically Signed   By: BJeannine BogaM.D.   On: 02/19/2021 02:00      Assessment: 75year old male presenting with acute onset of symptomatic LEFT lower extremity weakness with numbness and unsteady gait. MRI shows an acute LEFT pontine lacunar infarction. On exam, he has RUE weakness and right facial droop, but unknown what components are chronic versus acute 1. MRI brain: 1.2 cm acute ischemic nonhemorrhagic left pontine infarct. Age-related cerebral atrophy with moderately advanced chronic microvascular ischemic disease. 1.4 cm probable meningioma along the posterior falx. No associated mass effect. 2. Stroke most likely secondary to small vessel disease from chronic hypertension 3. EKG: Sinus bradycardia, ST & T wave abnormality, consider lateral ischemia  Recommendations: 1. HgbA1c, fasting lipid panel 2. CTA  of head and neck 3. PT consult,  OT consult, Speech consult 4. TTE 5. Atorvastatin 6. Start ASA 81 mg po qd  7. Risk factor modification 8. Telemetry monitoring 9. Frequent neuro checks 10 NPO until passes stroke swallow screen 11. BP management. Out of the permissive HTN time window    Electronically signed: Dr. Kerney Elbe 02/19/2021, 2:56 AM

## 2021-02-19 NOTE — Progress Notes (Signed)
SLP Cancellation Note  Patient Details Name: Wayne [name] MRN: [id number] DOB: November 17, 1945   Cancelled treatment:       Reason Eval/Treat Not Completed: RN reports pt passed [place]. Please reconsult if needs arise.   Waverley Krempasky B. Quentin Ore, [facility], [place] Speech Language Pathologist Office: [phone]  Shonna Chock 02/19/2021, 2:32 PM

## 2021-02-19 NOTE — ED Provider Notes (Signed)
[facility] EMERGENCY DEPARTMENT Provider Note   CSN: [id number] Arrival date & time: 02/18/21  1130     History Chief Complaint  Patient presents with   Dizziness    Wayne [name] is a 75 y.o. male.  75 y/o male with hx of DM, HTN, HLD, COPD, descending TAA, back pain, tobacco use (1ppd) presents to the ED for RLE weakness. Last known well at 0100 on 02/17/21. Reports waking up on 02/18/21 with weakness in the extremity. Has been unable to walk due to weakness when he typically ambulates with a cane at baseline. His symptoms have remained constant, unchanged. Patient reports associated dizziness characterized as feeling as though he is on a boat. Denies sensation of room spinning, lightheadedness, syncope/near syncope. Called his MD who thought his speech sounded a bit slurred, though patient did not notice this; he was advised to come to the ED for evaluation. He denies prior hx of CVA. No fevers, vision changes or loss, upper extremity weakness, extremity numbness or paresthesias. Denies new or worsening back pain or trauma, bowel/bladder incontinence.  The history is provided by the patient. No language interpreter was used.  Dizziness     Past Medical History:  Diagnosis Date   BPH (benign prostatic hyperplasia)    COPD (chronic obstructive pulmonary disease) (HCC)    Diabetes mellitus without complication (HCC)    GERD (gastroesophageal reflux disease)    Hypertension    Male erectile disorder    Meniere's disease    Mixed hyperlipidemia    Nontoxic single thyroid nodule    Somnolence    Thoracic aortic aneurysm    per CT chest 08/29/20 in Epic - stable diffuse aneurysmal dilation of the descending thoracic aorta with associated significant atheroschlerosis. 3.8 cm in maximum diameter.    Patient Active Problem List   Diagnosis Date Noted   Painful urination 04/04/2020   Encounter for smoking cessation counseling 12/07/2019   Acute thoracic back  pain 12/07/2019   Thoracic aortic aneurysm without rupture 12/07/2019   Thyroid nodule 12/07/2019   Other emphysema ([place]) 10/28/2019   Mild recurrent major depression ([place]) 10/28/2019   Lumbar back pain 10/28/2019   Overweight with body mass index (BMI) of 29 to 29.9 in adult 10/28/2019   Mixed hyperlipidemia    Hypertension    Diabetes mellitus without complication ([place])    Chronic coronary artery disease 03/06/2019   Chest pain in adult 02/03/2019   Tobacco abuse 02/03/2019   Memory loss 10/20/2016   Other headache syndrome 10/20/2016   Vertigo 10/20/2016   Abnormal brain MRI 10/20/2016   Mild cognitive impairment 10/20/2016   HYPERTENSION 05/30/2007    Past Surgical History:  Procedure Laterality Date   CATARACT EXTRACTION Bilateral    ELBOW SURGERY Right        Family History  Problem Relation Age of Onset   Heart attack Mother    Diabetes Father    COPD Father    Diverticulitis Brother     Social History   Tobacco Use   Smoking status: Every Day    Packs/day: 2.00    Types: Cigarettes   Smokeless tobacco: Never  Substance Use Topics   Alcohol use: Yes    Comment: occasional   Drug use: No    Home Medications Prior to Admission medications   Medication Sig Start Date End Date Taking? Authorizing Provider  acetaminophen (TYLENOL) 500 MG tablet Take 1 tablet (500 mg total) by mouth every 6 (six) hours  as needed. 05/13/17   Waynetta Pean, PA-C  albuterol (PROVENTIL) (2.5 MG/3ML) 0.083% nebulizer solution Inhale into the lungs.     [provider]  albuterol (VENTOLIN HFA) 108 (90 Base) MCG/ACT inhaler Inhale into the lungs every 6 (six) hours as needed.    [provider]  amLODipine (NORVASC) 5 MG tablet TAKE 1 TABLET BY MOUTH  DAILY 02/13/21   Cox, Kirsten, MD  Aspirin-Salicylamide-Caffeine (BC HEADACHE POWDER PO) Take 1 packet by mouth in the morning and at bedtime.    [provider]  atorvastatin (LIPITOR) 40 MG tablet TAKE 1  TABLET BY MOUTH  DAILY 02/13/21   Cox, Kirsten, MD  Blood Glucose Monitoring Suppl (ONE TOUCH ULTRA 2) w/Device KIT Use daily to check blood sugar E11.9 02/05/20   Cox, Elnita Maxwell, MD  Budeson-Glycopyrrol-Formoterol (BREZTRI AEROSPHERE) 160-9-4.8 MCG/ACT AERO Inhale into the lungs.    [provider]  buPROPion (WELLBUTRIN [name]) 150 MG 12 hr tablet Take 1 tablet (150 mg total) by mouth 2 (two) times daily. 12/07/19   Corum, Rex Kras, MD  cephALEXin (KEFLEX) 500 MG capsule Take 1 capsule (500 mg total) by mouth 2 (two) times daily. 10/17/20   Cox, Elnita Maxwell, MD  cyanocobalamin (,VITAMIN B-12,) 1000 MCG/ML injection Inject 1 mL (1,000 mcg total) into the muscle once a week. 08/17/20   Rip Harbour, NP  DULoxetine (CYMBALTA) 60 MG capsule TAKE 1 CAPSULE BY MOUTH  ONCE DAILY 06/28/20   Cox, Elnita Maxwell, MD  finasteride (PROSCAR) 5 MG tablet Take 1 tablet (5 mg total) by mouth at bedtime. 05/21/20   Lillard Anes, MD  fluticasone [facility]) 50 MCG/ACT nasal spray USE 2 SPRAY(S) IN EACH NOSTRIL ONCE DAILY AS NEEDED FOR ALLERGIES OR RHIITIS 09/03/20   Marge Duncans, PA-C  glucose blood test strip Use new test strip each time when checking blood sugar 02/21/20   Cox, Kirsten, MD  ipratropium-albuterol (DUONEB) 0.5-2.5 (3) MG/3ML SOLN Take 3 mLs by nebulization every 6 (six) hours as needed.    [provider]  losartan (COZAAR) 100 MG tablet TAKE 1 TABLET BY MOUTH  DAILY 06/28/20   Cox, Kirsten, MD  Melatonin 10 MG TABS Take 5 mg by mouth at bedtime.    [provider]  metoprolol succinate (TOPROL-XL) 25 MG 24 hr tablet Take 25 mg by mouth daily.  03/07/19   [provider]  omeprazole (PRILOSEC) 40 MG capsule TAKE 1 CAPSULE BY MOUTH  DAILY 06/28/20   Cox, Elnita Maxwell, MD  OneTouch Delica Lancets 50D MISC Use a new lancet each time when checking blood sugar 02/21/20   Cox, Kirsten, MD  sildenafil (VIAGRA) 100 MG tablet TAKE 1 TABLET BY MOUTH AS NEEDED APPROXIMATELY 1 HOUR PRIOR TO SEXUAL  ACTIVITY 10/18/20   Cox, Kirsten, MD  sitaGLIPtin (JANUVIA) 100 MG tablet Take 1 tablet (100 mg total) by mouth daily. 09/18/20   Rochel Brome, MD  tamsulosin (FLOMAX) 0.4 MG CAPS capsule TAKE 1 CAPSULE BY MOUTH  DAILY 12/12/20   Lillard Anes, MD  tiZANidine (ZANAFLEX) 4 MG tablet TAKE 1 TABLET BY MOUTH THREE TIMES DAILY 11/25/20   Cox, Elnita Maxwell, MD  traZODone (DESYREL) 50 MG tablet TAKE 1 TABLET BY MOUTH AT  BEDTIME AS NEEDED FOR SLEEP 11/20/20   Cox, Kirsten, MD    Allergies    Gabapentin, Sertraline, Metformin, Pineapple, Pravastatin, and Lorazepam  Review of Systems   Review of Systems  Neurological:  Positive for dizziness.  Ten systems reviewed and are negative for acute change,  except as noted in the HPI.    Physical Exam Updated Vital Signs BP (!) 146/93 (BP Location: Right Arm)   Pulse 90   Temp (!) 97.5 F (36.4 C) (Oral)   Resp 16   SpO2 97%   Physical Exam Vitals and nursing note reviewed.  Constitutional:      General: He is not in acute distress.    Appearance: He is well-developed. He is not diaphoretic.     Comments: Nontoxic appearing and in NAD  HENT:     Head: Normocephalic and atraumatic.  Eyes:     General: No scleral icterus.    Conjunctiva/sclera: Conjunctivae normal.  Cardiovascular:     Rate and Rhythm: Normal rate and regular rhythm.     Pulses: Normal pulses.  Pulmonary:     Effort: Pulmonary effort is normal. No respiratory distress.     Breath sounds: No stridor. No wheezing.     Comments: Respirations even and unlabored Musculoskeletal:        General: Normal range of motion.     Cervical back: Normal range of motion.     Comments: No TTP of the thoracic or lumbosacral midline. No bony deformities, step offs, crepitus.  Skin:    General: Skin is warm and dry.     Coloration: Skin is not pale.     Findings: No erythema or rash.  Neurological:     Mental Status: He is alert and oriented to person, place, and time.     Coordination:  Coordination normal.     Comments: GCS 15. Speech is goal oriented. No cranial nerve deficits appreciated; symmetric eyebrow raise, no facial drooping, tongue midline. Patient has equal grip strength bilaterally. Prior surgery to R elbow with inability to fully extend chronically; limits strength assessment. Able to hold BLE off bed against gravity, but with decreased strength in the RLE against resistance. 5/5 dorsiflexion and plantar flexion against resistance b/l. Sensation to light touch diminished in the RLE, otherwise intact, normal. No pronator drift. Patient ambulatory with 2 person assist with hesitant gait, some mild dragging of the right leg.  Psychiatric:        Behavior: Behavior normal.    ED Results / Procedures / Treatments   Labs (all labs ordered are listed, but only abnormal results are displayed) Labs Reviewed  PROTIME-INR - Abnormal; Notable for the following components:      Result Value   Prothrombin Time 15.4 (*)    All other components within normal limits  COMPREHENSIVE METABOLIC PANEL - Abnormal; Notable for the following components:   Potassium 3.0 (*)    CO2 21 (*)    Glucose, Bld 117 (*)    AST 13 (*)    All other components within normal limits  I-STAT CHEM 8, ED - Abnormal; Notable for the following components:   Potassium 3.1 (*)    Glucose, Bld 113 (*)    TCO2 21 (*)    All other components within normal limits  RESP PANEL BY RT-PCR (FLU A&B, COVID) ARPGX2  APTT  CBC  DIFFERENTIAL  CBG MONITORING, ED    EKG EKG Interpretation  Date/Time:  Tuesday February 18 2021 12:25:02 EDT Ventricular Rate:  54 PR Interval:  138 QRS Duration: 86 QT Interval:  420 QTC Calculation: 398 R Axis:   15 Text Interpretation: Sinus bradycardia ST & T wave abnormality, consider lateral ischemia Abnormal ECG No prior for comparison Confirmed by Thayer Jew [phone]) on 02/18/2021 11:22:43 PM  Radiology MR  Brain W and Wo Contrast  Result Date:  02/19/2021 CLINICAL DATA:  Initial evaluation for neuro deficit, stroke suspected. EXAM: MRI HEAD WITHOUT AND WITH CONTRAST TECHNIQUE: Multiplanar, multiecho pulse sequences of the brain and surrounding structures were obtained without and with intravenous contrast. CONTRAST:  60m GADAVIST GADOBUTROL 1 MMOL/ML IV SOLN COMPARISON:  None available. FINDINGS: Brain: Examination moderately to severely degraded by motion artifact. Diffuse prominence of the CSF containing spaces compatible with generalized cerebral atrophy. Patchy and confluent T2/FLAIR hyperintensity involving the periventricular and deep white matter of both cerebral hemispheres most consistent with chronic small vessel ischemic disease, moderately advanced in nature. Few scattered remote lacunar infarcts present about the basal ganglia/corona radiata. Small remote left cerebellar infarct. 1.2 cm focus of diffusion abnormality seen involving the left pons, consistent with an acute ischemic infarct (series 5, image 71). No associated hemorrhage or mass effect. No other visible foci of restricted diffusion to suggest acute or subacute ischemia. Gray-white matter differentiation otherwise maintained. No other areas of remote cortical infarction. No acute intracranial hemorrhage. Single chronic microhemorrhage noted within the subcortical white matter left frontal lobe, of doubtful significance in isolation. 1.4 cm enhancing lesion seen along the posterior falx immediately posterior to the splenium, likely a small meningioma (series 28, image 33). No significant associated mass effect. No other visible mass lesion, mass effect or midline shift. No hydrocephalus or extra-axial fluid collection. Pituitary gland and suprasellar region grossly within normal limits. Midline structures intact. Vascular: Major intracranial vascular flow voids are grossly maintained on this motion degraded exam. Skull and upper cervical spine: Craniocervical junction grossly within  normal limits. Bone marrow signal intensity normal. No scalp soft tissue abnormality. Sinuses/Orbits: Prior bilateral ocular lens replacement. Globes and orbital soft tissues demonstrate no acute abnormality. Paranasal sinuses are largely clear. No significant mastoid effusion. Other: None. IMPRESSION: 1. Motion degraded exam. 2. 1.2 cm acute ischemic nonhemorrhagic left pontine infarct. 3. Age-related cerebral atrophy with moderately advanced chronic microvascular ischemic disease. 4. 1.4 cm probable meningioma along the posterior falx. No associated mass effect. Electronically Signed   By: BJeannine BogaM.D.   On: 02/19/2021 02:00    Procedures Procedures   Medications Ordered in ED Medications  LORazepam (ATIVAN) injection 0.5 mg (has no administration in time range)  gadobutrol (GADAVIST) 1 MMOL/ML injection 7 mL (7 mLs Intravenous Contrast Given 02/19/21 0008)    ED Course  I have reviewed the triage vital signs and the nursing notes.  Pertinent labs & imaging results that were available during my care of the patient were reviewed by me and considered in my medical decision making (see chart for details).  Clinical Course as of 02/19/21 0407  Wed Feb 19, 2021  0253 Dr. LCheral Markermade aware of patient. Neurology to see in consultation. [KH]  [id number] [facility] CBridgett Lar[facility]. [KH]    Clinical Course User Index [KH] HAntonietta Breach PA-C   MDM Rules/Calculators/A&P                           75year old male to be admitted for further stroke evaluation.  MRI today showed left-sided pontine infarct.  Patient reporting associated dizziness and right lower extremity weakness noted since waking yesterday (02/18/21).  Will be seen by neurology in consultation.  [facility] to admit   Final Clinical Impression(s) / ED Diagnoses Final diagnoses:  Cerebrovascular accident (CVA), unspecified mechanism ([place]    Rx / DC  Orders ED Discharge Orders      None        Antonietta Breach, PA-C 02/19/21 0408    Merryl Hacker, MD 02/19/21 [phone]

## 2021-02-20 DIAGNOSIS — Z8249 Family history of ischemic heart disease and other diseases of the circulatory system: Secondary | ICD-10-CM | POA: Diagnosis not present

## 2021-02-20 DIAGNOSIS — Z825 Family history of asthma and other chronic lower respiratory diseases: Secondary | ICD-10-CM | POA: Diagnosis not present

## 2021-02-20 DIAGNOSIS — Z20822 Contact with and (suspected) exposure to covid-19: Secondary | ICD-10-CM | POA: Diagnosis present

## 2021-02-20 DIAGNOSIS — G4700 Insomnia, unspecified: Secondary | ICD-10-CM | POA: Diagnosis present

## 2021-02-20 DIAGNOSIS — H8109 Meniere's disease, unspecified ear: Secondary | ICD-10-CM | POA: Diagnosis present

## 2021-02-20 DIAGNOSIS — E876 Hypokalemia: Secondary | ICD-10-CM | POA: Diagnosis present

## 2021-02-20 DIAGNOSIS — T465X6A Underdosing of other antihypertensive drugs, initial encounter: Secondary | ICD-10-CM | POA: Diagnosis present

## 2021-02-20 DIAGNOSIS — I10 Essential (primary) hypertension: Secondary | ICD-10-CM | POA: Diagnosis present

## 2021-02-20 DIAGNOSIS — I6329 Cerebral infarction due to unspecified occlusion or stenosis of other precerebral arteries: Secondary | ICD-10-CM | POA: Diagnosis present

## 2021-02-20 DIAGNOSIS — E782 Mixed hyperlipidemia: Secondary | ICD-10-CM | POA: Diagnosis present

## 2021-02-20 DIAGNOSIS — F17210 Nicotine dependence, cigarettes, uncomplicated: Secondary | ICD-10-CM | POA: Diagnosis present

## 2021-02-20 DIAGNOSIS — R29703 NIHSS score 3: Secondary | ICD-10-CM | POA: Diagnosis not present

## 2021-02-20 DIAGNOSIS — N400 Enlarged prostate without lower urinary tract symptoms: Secondary | ICD-10-CM | POA: Diagnosis present

## 2021-02-20 DIAGNOSIS — R29700 NIHSS score 0: Secondary | ICD-10-CM | POA: Diagnosis present

## 2021-02-20 DIAGNOSIS — R29810 Facial weakness: Secondary | ICD-10-CM | POA: Diagnosis present

## 2021-02-20 DIAGNOSIS — I639 Cerebral infarction, unspecified: Secondary | ICD-10-CM | POA: Diagnosis not present

## 2021-02-20 DIAGNOSIS — I2510 Atherosclerotic heart disease of native coronary artery without angina pectoris: Secondary | ICD-10-CM | POA: Diagnosis present

## 2021-02-20 DIAGNOSIS — E119 Type 2 diabetes mellitus without complications: Secondary | ICD-10-CM | POA: Diagnosis present

## 2021-02-20 DIAGNOSIS — I712 Thoracic aortic aneurysm, without rupture, unspecified: Secondary | ICD-10-CM | POA: Diagnosis present

## 2021-02-20 DIAGNOSIS — R29702 NIHSS score 2: Secondary | ICD-10-CM | POA: Diagnosis not present

## 2021-02-20 DIAGNOSIS — I672 Cerebral atherosclerosis: Secondary | ICD-10-CM | POA: Diagnosis present

## 2021-02-20 DIAGNOSIS — G459 Transient cerebral ischemic attack, unspecified: Secondary | ICD-10-CM | POA: Diagnosis present

## 2021-02-20 DIAGNOSIS — J438 Other emphysema: Secondary | ICD-10-CM | POA: Diagnosis present

## 2021-02-20 DIAGNOSIS — G8311 Monoplegia of lower limb affecting right dominant side: Secondary | ICD-10-CM | POA: Diagnosis present

## 2021-02-20 DIAGNOSIS — E041 Nontoxic single thyroid nodule: Secondary | ICD-10-CM | POA: Diagnosis present

## 2021-02-20 DIAGNOSIS — Z9114 Patient's other noncompliance with medication regimen: Secondary | ICD-10-CM | POA: Diagnosis not present

## 2021-02-20 LAB — GLUCOSE, CAPILLARY
Glucose-Capillary: 96 mg/dL (ref 70–99)
Glucose-Capillary: 155 mg/dL — ABNORMAL HIGH (ref 70–99)

## 2021-02-20 LAB — CBG MONITORING, ED
Glucose-Capillary: 141 mg/dL — ABNORMAL HIGH (ref 70–99)
Glucose-Capillary: 106 mg/dL — ABNORMAL HIGH (ref 70–99)

## 2021-02-20 MED ORDER — TRAZODONE HCL 50 MG PO TABS
50.0000 mg | ORAL_TABLET | Freq: Every day | ORAL | Status: DC
  Administered 2021-02-20 – 2021-02-21 (×2): 50 mg via ORAL
  Filled 2021-02-20 (×2): qty 1

## 2021-02-20 NOTE — ED Notes (Signed)
Pt up out of room walking with PT

## 2021-02-20 NOTE — Progress Notes (Signed)
Physical Therapy Treatment Patient Details Name: Wayne [name] MRN: [id number] DOB: 1946/03/13 Today's Date: 02/20/2021   History of Present Illness 75 year old white male with a history of COPD, DM, HTN, erectile dysfunction, Meniere's disease, HLD, thoracic aortic aneurysm and tobacco abuse admitted due to R LE weakness and numbness found to have L pontine infarct.  Patient with chronic R UE elbow contracture.    PT Comments    Pt progressing towards goals, however, continues to demonstrate weakness in R extremities and instability. Required min to mod A for stability during gait using RW. Noted instability in RLE, especially with increased distance. Reviewed seated HEP as well. Current recommendations appropriate. Will continue to follow acutely.     Recommendations for follow up therapy are one component of a multi-disciplinary discharge planning process, led by the attending physician.  Recommendations may be updated based on patient status, additional functional criteria and insurance authorization.  Follow Up Recommendations  CIR     Equipment Recommendations  Other (comment) (TBD)    Recommendations for Other Services       Precautions / Restrictions Precautions Precautions: Fall Restrictions Weight Bearing Restrictions: No     Mobility  Bed Mobility               General bed mobility comments: In recliner upon entry    Transfers Overall transfer level: Needs assistance Equipment used: Rolling walker (2 wheeled) Transfers: Sit to/from Stand Sit to Stand: Min assist         General transfer comment: Min A For steadying assist to stand X2.  Ambulation/Gait Ambulation/Gait assistance: Min assist;Mod assist Gait Distance (Feet): 75 Feet (X2) Assistive device: Rolling walker (2 wheeled) Gait Pattern/deviations: Step-to pattern;Step-through pattern;Decreased stride length;Drifts right/left Gait velocity: Decreased   General Gait Details: Noted  instability of RLE, especially with increased distance. Min to mod A for steadying A. Required seated rest in between gait trials. Cues for step length and heel strike.   Stairs             Wheelchair Mobility    Modified Rankin (Stroke Patients Only) Modified Rankin (Stroke Patients Only) Pre-Morbid Rankin Score: No significant disability Modified Rankin: Moderately severe disability     Balance Overall balance assessment: Needs assistance Sitting-balance support: No upper extremity supported Sitting balance-Leahy Scale: Good     Standing balance support: Bilateral upper extremity supported Standing balance-Leahy Scale: Poor Standing balance comment: Reliant on UE and external support                            Cognition Arousal/Alertness: Awake/alert Behavior During Therapy: WFL for tasks assessed/performed Overall Cognitive Status: Impaired/Different from baseline Area of Impairment: Problem solving;Safety/judgement;Memory                     Memory: Decreased short-term memory   Safety/Judgement: Decreased awareness of safety   Problem Solving: Slow processing General Comments: Notable memory deficits. Required safety cues throughout.      Exercises General Exercises - Lower Extremity Long Arc Quad: AROM;Both;10 reps;Seated (cues for slow, controlled movement) Hip Flexion/Marching: AROM;Both;10 reps;Seated (cues for slow, controlled movement)    General Comments        Pertinent Vitals/Pain Pain Assessment: No/denies pain    Home Living     Available Help at Discharge: Family;Available 24 hours/day Type of Home: Mobile home              Prior Function  PT Goals (current goals can now be found in the care plan section) Acute Rehab PT Goals Patient Stated Goal: to go home PT Goal Formulation: With patient/family Time For Goal Achievement: 03/05/21 Potential to Achieve Goals: Good Progress towards PT goals:  Progressing toward goals    Frequency    Min 4X/week      PT Plan Current plan remains appropriate    Co-evaluation              AM-PAC PT "6 Clicks" Mobility   Outcome Measure  Help needed turning from your back to your side while in a flat bed without using bedrails?: A Little Help needed moving from lying on your back to sitting on the side of a flat bed without using bedrails?: A Little Help needed moving to and from a bed to a chair (including a wheelchair)?: A Little Help needed standing up from a chair using your arms (e.g., wheelchair or bedside chair)?: A Little Help needed to walk in hospital room?: A Lot Help needed climbing 3-5 steps with a railing? : Total 6 Click Score: 15    End of Session Equipment Utilized During Treatment: Gait belt Activity Tolerance: Patient tolerated treatment well Patient left: in chair;with call bell/phone within reach;with family/visitor present;Other (comment) (in chair in ED) Nurse Communication: Mobility status PT Visit Diagnosis: Other abnormalities of gait and mobility (R26.89);Other symptoms and signs involving the nervous system (R29.898);Muscle weakness (generalized) (M62.81);Hemiplegia and hemiparesis Hemiplegia - Right/Left: Right Hemiplegia - dominant/non-dominant: Non-dominant Hemiplegia - caused by: Cerebral infarction     Time: 1040-1058 PT Time Calculation (min) (ACUTE ONLY): 18 min  Charges:  $Gait Training: 8-22 mins                     Lou Miner, DPT  Acute Rehabilitation Services  Pager: [phone] Office: [phone]    Rudean Hitt 02/20/2021, 3:05 PM

## 2021-02-20 NOTE — ED Notes (Signed)
Pt awake after about a 30-45 minute nap  blanket given  pt c/o being here in the ed

## 2021-02-20 NOTE — Evaluation (Signed)
Speech Language Pathology Evaluation Patient Details Name: Wayne [name] MRN: [id number] DOB: 1945-06-10 Today's Date: 02/20/2021 Time: 1315-1340 SLP Time Calculation (min) (ACUTE ONLY): 25 min  Problem List:  Patient Active Problem List   Diagnosis Date Noted   TIA (transient ischemic attack) 02/20/2021   Left pontine CVA ([place]) 02/19/2021   Left pontine stroke ([place]) 02/19/2021   Painful urination 04/04/2020   Encounter for smoking cessation counseling 12/07/2019   Acute thoracic back pain 12/07/2019   Thoracic aortic aneurysm without rupture 12/07/2019   Thyroid nodule 12/07/2019   Other emphysema ([place]) 10/28/2019   Mild recurrent major depression ([place]) 10/28/2019   Lumbar back pain 10/28/2019   Overweight with body mass index (BMI) of 29 to 29.9 in adult 10/28/2019   Mixed hyperlipidemia    Hypertension    Diabetes mellitus without complication ([place])    Chronic coronary artery disease 03/06/2019   Chest pain in adult 02/03/2019   Tobacco abuse 02/03/2019   Memory loss 10/20/2016   Other headache syndrome 10/20/2016   Vertigo 10/20/2016   Abnormal brain MRI 10/20/2016   Mild cognitive impairment 10/20/2016   Essential hypertension 05/30/2007   Past Medical History:  Past Medical History:  Diagnosis Date   BPH (benign prostatic hyperplasia)    COPD (chronic obstructive pulmonary disease) ([place])    Diabetes mellitus without complication (HCC)    GERD (gastroesophageal reflux disease)    Hypertension    Male erectile disorder    Meniere's disease    Mixed hyperlipidemia    Nontoxic single thyroid nodule    Somnolence    Thoracic aortic aneurysm    per CT chest 08/29/20 in Epic - stable diffuse aneurysmal dilation of the descending thoracic aorta with associated significant atheroschlerosis. 3.8 cm in maximum diameter.   Past Surgical History:  Past Surgical History:  Procedure Laterality Date   CATARACT EXTRACTION Bilateral    ELBOW SURGERY Right    HPI:   75yo male admitted 02/18/21 with right leg weakness/numbness. PMH: COPD, HTN, DM2, HLD, tobacco abuse, BPH, GERD   Assessment / Plan / Recommendation Clinical Impression  The [place] Mental Status (SLUMS) Examination was administered this date. Pt scored 20/30, raising concern for mild neurocognitive impairment in individuals with less than high school education (pt has 11th grade education). Pt exhibited difficulty with orientation to day of the week, clock drawing (executive functions, problem solving), auditory attention and recall, and digit reversal. Pt reports he has had difficulty with these areas for over a decade, and RN confirms this information. Pt is not interested in pursuing skilled ST intervention at this time. He is more concerned about being discharged. Recommend referral to outpatient speech therapy services if needs arise after return to normal routines. Acute ST signing off at this time.    SLP Assessment  SLP Recommendation/Assessment: All further Speech Language Pathology  needs can be addressed in the next venue of care  SLP Visit Diagnosis: Cognitive communication deficit (R41.841)    Recommendations for follow up therapy are one component of a multi-disciplinary discharge planning process, led by the attending physician.  Recommendations may be updated based on patient status, additional functional criteria and insurance authorization.    Follow Up Recommendations  Outpatient SLP if needs arise upon return to normal routines         SLP Evaluation Cognition  Overall Cognitive Status: No family/caregiver present to determine baseline cognitive functioning Arousal/Alertness: Awake/alert Orientation Level: Oriented to person;Oriented to place;Oriented to time (difficulty with  current day) Year: 2022 Month: October Day of Week: Incorrect Attention: Focused;Sustained Focused Attention: Appears intact Sustained Attention: Impaired Sustained Attention  Impairment: Verbal basic       Comprehension  Auditory Comprehension Overall Auditory Comprehension: Appears within functional limits for tasks assessed Visual Recognition/Discrimination Discrimination: Not tested Reading Comprehension Reading Status: Not tested    Expression Expression Primary Mode of Expression: Verbal Verbal Expression Overall Verbal Expression: Appears within functional limits for tasks assessed Written Expression Dominant Hand: Left Written Expression: Not tested   Oral / Motor  Oral Motor/Sensory Function Overall Oral Motor/Sensory Function: Within functional limits Motor Speech Overall Motor Speech: Appears within functional limits for tasks assessed   GO                   Auri Jahnke B. Quentin Ore, [facility], [place] Speech Language Pathologist Office: [phone]  Shonna Chock 02/20/2021, 1:55 PM

## 2021-02-20 NOTE — Evaluation (Signed)
Occupational Therapy Evaluation Patient Details Name: Wayne [name] MRN: [id number] DOB: 02/23/1946 Today's Date: 02/20/2021   History of Present Illness 75 year old white male with a history of COPD, DM, HTN, erectile dysfunction, Meniere's disease, HLD, thoracic aortic aneurysm and tobacco abuse admitted due to R LE weakness and numbness found to have L pontine infarct.  Patient with chronic R UE elbow contracture.   Clinical Impression   Patient admitted for the diagnosis above. PTA he lives with his spouse, who can provide supportive assist, but cannot physically move him.  He was independent with all self care/IADL, mobility, and continued to drive.  Deficits impacting independence have been listed below.  Currently he is needing up to [place] for in room mobility and lower body ADL for balance support.  OT to follow in the acute setting, but CIR has been recommended to maximize his functional status prior to returning home.        Recommendations for follow up therapy are one component of a multi-disciplinary discharge planning process, led by the attending physician.  Recommendations may be updated based on patient status, additional functional criteria and insurance authorization.   Follow Up Recommendations  CIR    Equipment Recommendations  Tub/shower seat    Recommendations for Other Services Rehab consult     Precautions / Restrictions Precautions Precautions: Fall Restrictions Weight Bearing Restrictions: No Other Position/Activity Restrictions: R elbow fixed at 90 degrees flexion - chronic      Mobility Bed Mobility Overal bed mobility: Needs Assistance Bed Mobility: Supine to Sit     Supine to sit: Supervision     General bed mobility comments: In recliner upon entry    Transfers Overall transfer level: Needs assistance Equipment used: Rolling walker (2 wheeled) Transfers: Sit to/from [facility] Sit to Stand: Min assist Stand pivot  transfers: Min guard       General transfer comment: assist to power up and cues to push    Balance Overall balance assessment: Needs assistance Sitting-balance support: No upper extremity supported Sitting balance-Leahy Scale: Good     Standing balance support: Bilateral upper extremity supported Standing balance-Leahy Scale: Poor Standing balance comment: Reliant on UE and external support                           ADL either performed or assessed with clinical judgement   ADL Overall ADL's : Needs assistance/impaired Eating/Feeding: Independent;Sitting   Grooming: Wash/dry hands;Supervision/safety;Standing   Upper Body Bathing: Supervision/ safety;Sitting   Lower Body Bathing: Minimal assistance;Sit to/from stand Lower Body Bathing Details (indicate cue type and reason): balance support Upper Body Dressing : Supervision/safety;Sitting   Lower Body Dressing: Minimal assistance;Sit to/from stand   Toilet Transfer: Min guard;RW;Ambulation           Functional mobility during ADLs: Min guard;Rolling walker       Vision Patient Visual Report: No change from baseline       Perception  WFL   Praxis  Intact    Pertinent Vitals/Pain Pain Assessment: No/denies pain     Hand Dominance Left   Extremity/Trunk Assessment Upper Extremity Assessment Upper Extremity Assessment: RUE deficits/detail RUE Deficits / Details: chronic R elbow flexion contracture no extension past 90 degrees, shoulder WFL, grip weaker compared to L RUE Sensation: decreased light touch RUE Coordination: WNL   Lower Extremity Assessment Lower Extremity Assessment: Defer to PT evaluation   Cervical / Trunk Assessment Cervical / Trunk Assessment: Kyphotic  Communication Communication Communication: HOH   Cognition Arousal/Alertness: Awake/alert Behavior During Therapy: WFL for tasks assessed/performed Overall Cognitive Status: Within Functional Limits for tasks  assessed Area of Impairment: Problem solving;Safety/judgement;Memory                     Memory: Decreased short-term memory   Safety/Judgement: Decreased awareness of safety;Decreased awareness of deficits   Problem Solving: Slow processing General Comments: HOH impacts   General Comments       Exercises   Shoulder Instructions      Home Living Family/patient expects to be discharged to:: Private residence Living Arrangements: Spouse/significant other Available Help at Discharge: Family;Available 24 hours/day Type of Home: Mobile home Home Access: Stairs to enter Entrance Stairs-Number of Steps: 8 Entrance Stairs-Rails: Right;Left Home Layout: One level     Bathroom Shower/Tub: Teacher, early years/pre: Standard     Home Equipment: Kasandra Knudsen - single point      Lives With: Spouse    Prior Functioning/Environment Level of Independence: Independent with assistive device(s)        Comments: used cane PTA        OT Problem List: Decreased strength;Decreased activity tolerance;Impaired balance (sitting and/or standing);Decreased knowledge of use of DME or AE;Decreased safety awareness;Impaired sensation      OT Treatment/Interventions: Self-care/ADL training;Therapeutic exercise;Therapeutic activities;DME and/or AE instruction;Patient/family education;Balance training    OT Goals(Current goals can be found in the care plan section) Acute Rehab OT Goals Patient Stated Goal: patient perfers to go home, but understands he needs rehab OT Goal Formulation: With patient Time For Goal Achievement: 03/06/21 Potential to Achieve Goals: Good ADL Goals Pt Will Perform Grooming: with modified independence;standing Pt Will Perform Lower Body Bathing: with modified independence;sit to/from stand Pt Will Perform Lower Body Dressing: with modified independence;sit to/from stand Pt Will Transfer to Toilet: with modified independence;ambulating;regular height  toilet Pt Will Perform Tub/Shower Transfer: with modified independence;ambulating;shower seat  OT Frequency: Min 2X/week   Barriers to D/C:    none noted       Co-evaluation              AM-PAC OT "6 Clicks" Daily Activity     Outcome Measure Help from another person eating meals?: None Help from another person taking care of personal grooming?: None Help from another person toileting, which includes using toliet, bedpan, or urinal?: A Little Help from another person bathing (including washing, rinsing, drying)?: A Little Help from another person to put on and taking off regular upper body clothing?: None Help from another person to put on and taking off regular lower body clothing?: A Little 6 Click Score: 21   End of Session Equipment Utilized During Treatment: Rolling walker;Gait belt Nurse Communication: Mobility status  Activity Tolerance: Patient tolerated treatment well Patient left: with call bell/phone within reach;with chair alarm set;with family/visitor present  OT Visit Diagnosis: Unsteadiness on feet (R26.81);Muscle weakness (generalized) (M62.81);Hemiplegia and hemiparesis Hemiplegia - Right/Left: Right Hemiplegia - dominant/non-dominant: Non-Dominant Hemiplegia - caused by: Cerebral infarction                Time: 1611-1630 OT Time Calculation (min): 19 min Charges:  OT General Charges $OT Visit: 1 Visit OT Evaluation $OT Eval Moderate Complexity: 1 Mod  02/20/2021  RP, OTR/L  Acute Rehabilitation Services  Office:  [phone]   Metta Clines 02/20/2021, 5:06 PM

## 2021-02-20 NOTE — ED Notes (Signed)
The pt has been eating  before I took over at [employer]

## 2021-02-20 NOTE — Progress Notes (Signed)
PROGRESS NOTE    Wayne [name]  YDX:[id number] DOB: December 05, 1945 DOA: 02/18/2021 PCP: Cipriano Mile, NP   Brief Narrative: 75 year old white male with a history of COPD, hypertension, type 2 diabetes, hyperlipidemia, tobacco abuse presents the ER today at the behest of his family doctor.  Patient states that on Monday afternoon February 17, 2021, about 12 PM, patient was outside in his yard doing some yard work.  He had a sudden loss of strength in his right leg.  He also felt numb in his right leg.  He thought the symptoms would go away.  Patient carried on for the rest of his day.  Did not have any slurred speech.  Not have any falls.  He let his wife know and she called the doctor the next day on Tuesday.  When the doctor called him back on Wednesday, the patient still having symptoms.  PCP recommended patient go to the hospital ER for evaluation.   On arrival to ER temp was 98.1 heart rate 87 blood pressure 139/77 sats 98%.   Lab evaluation showed serum potassium 3.0.  Glucose 117.  BUN of 11.  Creatinine 0.88.  Hemoglobin of 13.8.   MRI of the brain with and without contrast demonstrated 1.2 cm focus of diffusion abnormality involving the left pons consistent with acute ischemic infarct.  No hemorrhage or mass-effect.   EKG demonstrated sinus bradycardia.  There is nonspecific ST-T wave changes.   Due to the acute ischemic stroke noted on MRI, [facility] hospitalist contacted for admission.  Patient out of tPA window due to his symptoms starting on February 17, 2021.    ED Course: labwork unremarkable except for hypokalemia. MRI brain showed pontine stroke.    Assessment & Plan:   Principal Problem:   Left pontine CVA ([place]) Active Problems:   Essential hypertension   Mixed hyperlipidemia   Diabetes mellitus without complication (HCC)   Other emphysema (HCC)   Tobacco abuse   Left pontine stroke ([place])   TIA (transient ischemic attack)   Left pontine CVA (HCC)-MRI brain left  pontine stroke. Ct head and neck no LVO.EF 60% LDL 58 CR 0.80 A1C 5.8  ON ASA AND PLAVIX FOR 3 WEEKS THEN ASA ALONE CIR CONSULTED    Essential hypertension-continue Norvasc and Cozaar  Mixed hyperlipidemia  Continue lipitor 40 mg daily.   Diabetes mellitus without complication ([place]) CONTINUE SSI   COPD STABLE   Tobacco abuse Advised to stop smoking. Nicotine patch.  BPH continue Flomax  Estimated body mass index is 29.6 kg/m as calculated from the following:   Height as of 10/17/20: 5\' 7"  (1.702 m).   Weight as of 10/17/20: 85.7 kg.  DVT prophylaxis: Lovenox  code Status: Full code  family Communication: Discussed with wife at bedside disposition Plan:  Status is: Inpatient  Remains inpatient appropriate because:Ongoing active pain requiring inpatient pain management and Inpatient level of care appropriate due to severity of illness  Dispo: The patient is from: Home              Anticipated d/c is to: SNF              Patient currently is not medically stable to d/c.   Difficult to place patient No  Consultants: NEURO   Procedures: NONE Antimicrobials: NONE   Subjective:  Sitting up in chair wife by the bedside anxious to go home Seen by physical therapist recommends CIR CIR consulted Speech clear Objective: Vitals:   02/20/21 0000 02/20/21 0140  02/20/21 0400 02/20/21 0800  BP: (!) 154/113 (!) 164/151 (!) 170/87 (!) 156/101  Pulse: (!) 54 71 (!) 55 62  Resp: 19 19 16 20   Temp:      TempSrc:      SpO2: (!) 89% 98% 97% 97%   No intake or output data in the 24 hours ending 02/20/21 1320 There were no vitals filed for this visit.  Examination:  General exam: Appears calm and comfortable  Respiratory system: Clear to auscultation. Respiratory effort normal. Cardiovascular system: S1 & S2 heard, RRR. No JVD, murmurs, rubs, gallops or clicks. No pedal edema. Gastrointestinal system: Abdomen is nondistended, soft and nontender. No organomegaly or masses felt.  Normal bowel sounds heard. Central nervous system: Alert and oriented.  Right lower extremity 4 x 5 with decreased sensation  extremities: No edema. Skin: No rashes, lesions or ulcers Psychiatry: Judgement and insight appear normal. Mood & affect appropriate.     Data Reviewed: I have personally reviewed following labs and imaging studies  CBC: Recent Labs  Lab 02/18/21 1221 02/18/21 1232 02/19/21 1843  WBC 6.8  --  7.6  NEUTROABS 4.6  --   --   HGB 13.8 13.9 14.0  HCT 41.2 41.0 40.9  MCV 90.7  --  89.3  PLT 245  --  578   Basic Metabolic Panel: Recent Labs  Lab 02/18/21 1221 02/18/21 1232 02/19/21 1843  NA 138 143  --   K 3.0* 3.1*  --   CL 108 107  --   CO2 21*  --   --   GLUCOSE 117* 113*  --   BUN 11 10  --   CREATININE 0.88 0.80 0.85  CALCIUM 9.1  --   --   MG  --   --  1.4*   GFR: CrCl cannot be calculated (Unknown ideal weight.). Liver Function Tests: Recent Labs  Lab 02/18/21 1221  AST 13*  ALT 13  ALKPHOS 58  BILITOT 1.0  PROT 6.9  ALBUMIN 3.7   No results for input(s): LIPASE, AMYLASE in the last 168 hours. No results for input(s): AMMONIA in the last 168 hours. Coagulation Profile: Recent Labs  Lab 02/18/21 1221  INR 1.2   Cardiac Enzymes: No results for input(s): CKTOTAL, CKMB, CKMBINDEX, TROPONINI in the last 168 hours. BNP (last 3 results) No results for input(s): PROBNP in the last 8760 hours. HbA1C: Recent Labs    02/19/21 0700  HGBA1C 5.8*   CBG: Recent Labs  Lab 02/19/21 1714 02/19/21 1921 02/19/21 2256 02/20/21 0815 02/20/21 1212  GLUCAP 89 119* 116* 141* 106*   Lipid Profile: Recent Labs    02/19/21 0700  CHOL 106  HDL 32*  LDLCALC 58  TRIG 81  CHOLHDL 3.3   Thyroid Function Tests: No results for input(s): TSH, T4TOTAL, FREET4, T3FREE, THYROIDAB in the last 72 hours. Anemia Panel: No results for input(s): VITAMINB12, FOLATE, FERRITIN, TIBC, IRON, RETICCTPCT in the last 72 hours. Sepsis Labs: No results  for input(s): PROCALCITON, LATICACIDVEN in the last 168 hours.  Recent Results (from the past 240 hour(s))  Resp Panel by RT-PCR (Flu A&B, Covid) Nasopharyngeal Swab     Status: None   Collection Time: 02/19/21  3:03 AM   Specimen: Nasopharyngeal Swab; Nasopharyngeal(NP) swabs in vial transport medium  Result Value Ref Range Status   SARS Coronavirus 2 by RT PCR NEGATIVE NEGATIVE Final    Comment: (NOTE) SARS-CoV-2 target nucleic acids are NOT DETECTED.  The SARS-CoV-2 RNA is generally detectable in  upper respiratory specimens during the acute phase of infection. The lowest concentration of SARS-CoV-2 viral copies this assay can detect is 138 copies/mL. A negative result does not preclude SARS-Cov-2 infection and should not be used as the sole basis for treatment or other patient management decisions. A negative result may occur with  improper specimen collection/handling, submission of specimen other than nasopharyngeal swab, presence of viral mutation(s) within the areas targeted by this assay, and inadequate number of viral copies(<138 copies/mL). A negative result must be combined with clinical observations, patient history, and epidemiological information. The expected result is Negative.  Fact Sheet for Patients:  EntrepreneurPulse.com.au  Fact Sheet for Healthcare Providers:  IncredibleEmployment.be  This test is no t yet approved or cleared by the [place] FDA and  has been authorized for detection and/or diagnosis of SARS-CoV-2 by FDA under an Emergency Use Authorization (EUA). This EUA will remain  in effect (meaning this test can be used) for the duration of the COVID-19 declaration under Section 564(b)(1) of the Act, 21 U.S.C.section 360bbb-3(b)(1), unless the authorization is terminated  or revoked sooner.       Influenza A by PCR NEGATIVE NEGATIVE Final   Influenza B by PCR NEGATIVE NEGATIVE Final    Comment: (NOTE) The  Xpert Xpress SARS-CoV-2/FLU/RSV plus assay is intended as an aid in the diagnosis of influenza from Nasopharyngeal swab specimens and should not be used as a sole basis for treatment. Nasal washings and aspirates are unacceptable for Xpert Xpress SARS-CoV-2/FLU/RSV testing.  Fact Sheet for Patients: EntrepreneurPulse.com.au  Fact Sheet for Healthcare Providers: IncredibleEmployment.be  This test is not yet approved or cleared by the [place] FDA and has been authorized for detection and/or diagnosis of SARS-CoV-2 by FDA under an Emergency Use Authorization (EUA). This EUA will remain in effect (meaning this test can be used) for the duration of the COVID-19 declaration under Section 564(b)(1) of the Act, 21 U.S.C. section 360bbb-3(b)(1), unless the authorization is terminated or revoked.  Performed at [facility], [place] [address], [place], [place] [address]          Radiology Studies: CT ANGIO HEAD NECK W WO CM  Result Date: 02/19/2021 CLINICAL DATA:  Stroke follow-up EXAM: CT ANGIOGRAPHY HEAD AND NECK TECHNIQUE: Multidetector CT imaging of the head and neck was performed using the standard protocol during bolus administration of intravenous contrast. Multiplanar CT image reconstructions and MIPs were obtained to evaluate the vascular anatomy. Carotid stenosis measurements (when applicable) are obtained utilizing NASCET criteria, using the distal internal carotid diameter as the denominator. CONTRAST:  57mL OMNIPAQUE IOHEXOL 350 MG/ML SOLN COMPARISON:  Brain MRI pained approximately 12 hours prior. FINDINGS: CT HEAD FINDINGS Brain: There is hypodensity in the left pons corresponding to the infarct seen on the prior brain MRI. There is no evidence of hemorrhage. There is no evidence of new territorial infarct. There is no acute intracranial hemorrhage or extra-axial fluid collection. There is moderate global parenchymal volume loss.  Hypodensity in the subcortical and periventricular white matter likely reflects sequela of moderate chronic white matter microangiopathy. The left para falcine meningioma is again seen. There is no other solid mass lesion. There is no midline shift. Vascular: See below. Skull: Normal. Negative for fracture or focal lesion. Sinuses and orbits: The paranasal sinuses are clear. Bilateral lens implants are in place. The globes and orbits are otherwise unremarkable. Review of the MIP images confirms the above findings CTA NECK FINDINGS Aortic arch: There is calcified atherosclerotic plaque of the  aortic arch and the origins of the great vessels without hemodynamically significant stenosis. There is a common origin of the right brachiocephalic and left common carotid arteries, a normal variant. Right carotid system: There is mild soft and calcified atherosclerotic plaque in the proximal right internal carotid artery without hemodynamically limiting stenosis or occlusion. There is no dissection or aneurysm. The right internal carotid artery has a tortuous course in the neck. Left carotid system: There is soft and calcified atherosclerotic plaque in the proximal left internal carotid artery without hemodynamically significant stenosis by NASCET criteria. There is no dissection or aneurysm. Vertebral arteries: The vertebral arteries are patent, with no significant stenosis, occlusion, dissection, or aneurysm. Skeleton: There is mild multilevel degenerative change of the cervical spine. There is no acute osseous abnormality or aggressive osseous lesion. Other neck: The soft tissues are unremarkable. Upper chest: There is severe emphysema in the lung apices. Review of the MIP images confirms the above findings CTA HEAD FINDINGS Anterior circulation: There is mild calcified atherosclerotic plaque in the bilateral intracranial carotid arteries without significant stenosis or occlusion. The bilateral MCAs and ACAs are patent.  There is no aneurysm. Posterior circulation: The bilateral V4 segments are patent. PICA is seen bilaterally. The basilar artery is patent. The bilateral PCAs are patent. Venous sinuses: Patent. Anatomic variants: The right posterior communicating artery is not definitively identified. Review of the MIP images confirms the above findings IMPRESSION: 1. Hypodensity in the left pons consistent with evolving infarct as seen on the prior brain MRI. No evidence of hemorrhage. 2. Patent intracranial vasculature with minimal atherosclerotic disease. Specifically, the basilar artery is widely patent. 3. Mild soft and calcified atherosclerotic plaque in the bilateral carotid bulbs without hemodynamically significant stenosis. Patent vertebral arteries. 4. Tortuosity of the right internal carotid artery may be due to hypertension. 5. Unchanged global parenchymal volume loss and chronic white matter microangiopathy. 6. Severe emphysema in the lung apices. Aortic Atherosclerosis (ICD10-I70.0) and Emphysema (ICD10-J43.9). Electronically Signed   By: Valetta Mole M.D.   On: 02/19/2021 10:31   MR Brain W and Wo Contrast  Result Date: 02/19/2021 CLINICAL DATA:  Initial evaluation for neuro deficit, stroke suspected. EXAM: MRI HEAD WITHOUT AND WITH CONTRAST TECHNIQUE: Multiplanar, multiecho pulse sequences of the brain and surrounding structures were obtained without and with intravenous contrast. CONTRAST:  34mL GADAVIST GADOBUTROL 1 MMOL/ML IV SOLN COMPARISON:  None available. FINDINGS: Brain: Examination moderately to severely degraded by motion artifact. Diffuse prominence of the CSF containing spaces compatible with generalized cerebral atrophy. Patchy and confluent T2/FLAIR hyperintensity involving the periventricular and deep white matter of both cerebral hemispheres most consistent with chronic small vessel ischemic disease, moderately advanced in nature. Few scattered remote lacunar infarcts present about the basal  ganglia/corona radiata. Small remote left cerebellar infarct. 1.2 cm focus of diffusion abnormality seen involving the left pons, consistent with an acute ischemic infarct (series 5, image 71). No associated hemorrhage or mass effect. No other visible foci of restricted diffusion to suggest acute or subacute ischemia. Gray-white matter differentiation otherwise maintained. No other areas of remote cortical infarction. No acute intracranial hemorrhage. Single chronic microhemorrhage noted within the subcortical white matter left frontal lobe, of doubtful significance in isolation. 1.4 cm enhancing lesion seen along the posterior falx immediately posterior to the splenium, likely a small meningioma (series 28, image 33). No significant associated mass effect. No other visible mass lesion, mass effect or midline shift. No hydrocephalus or extra-axial fluid collection. Pituitary gland and suprasellar region grossly within  normal limits. Midline structures intact. Vascular: Major intracranial vascular flow voids are grossly maintained on this motion degraded exam. Skull and upper cervical spine: Craniocervical junction grossly within normal limits. Bone marrow signal intensity normal. No scalp soft tissue abnormality. Sinuses/Orbits: Prior bilateral ocular lens replacement. Globes and orbital soft tissues demonstrate no acute abnormality. Paranasal sinuses are largely clear. No significant mastoid effusion. Other: None. IMPRESSION: 1. Motion degraded exam. 2. 1.2 cm acute ischemic nonhemorrhagic left pontine infarct. 3. Age-related cerebral atrophy with moderately advanced chronic microvascular ischemic disease. 4. 1.4 cm probable meningioma along the posterior falx. No associated mass effect. Electronically Signed   By: Jeannine Boga M.D.   On: 02/19/2021 02:00   ECHOCARDIOGRAM COMPLETE  Result Date: 02/19/2021    ECHOCARDIOGRAM REPORT   Patient Name:   Wayne [name] Date of Exam: 02/19/2021 Medical Rec  #:  [id number]           Height:       67.0 in Accession #:    [id number]          Weight:       189.0 lb Date of Birth:  07-10-1945           BSA:          1.974 m Patient Age:    24 years            BP:           146/93 mmHg Patient Gender: M                   HR:           71 bpm. Exam Location:  Inpatient Procedure: 2D Echo, Color Doppler and Cardiac Doppler Indications:    CVA  History:        Patient has no prior history of Echocardiogram examinations.                 CAD; Risk Factors:Hypertension, Dyslipidemia, emphysema and                 Current Smoker.  Sonographer:    Melissa Morford RDCS (AE, PE) Referring Phys: [place]  1. Left ventricular ejection fraction, by estimation, is 60 to 65%. The left ventricle has normal function. The left ventricle has no regional wall motion abnormalities. There is mild left ventricular hypertrophy. Left ventricular diastolic parameters are consistent with Grade I diastolic dysfunction (impaired relaxation).  2. Right ventricular systolic function is normal. The right ventricular size is normal. Tricuspid regurgitation signal is inadequate for assessing PA pressure.  3. A small pericardial effusion is present. The pericardial effusion is anterior to the right ventricle. There is no evidence of cardiac tamponade.  4. The mitral valve is normal in structure. No evidence of mitral valve regurgitation.  5. The aortic valve is tricuspid. Aortic valve regurgitation is not visualized. Mild aortic valve sclerosis is present, with no evidence of aortic valve stenosis.  6. The inferior vena cava is normal in size with greater than 50% respiratory variability, suggesting right atrial pressure of 3 mmHg. Comparison(s): No prior Echocardiogram. FINDINGS  Left Ventricle: Left ventricular ejection fraction, by estimation, is 60 to 65%. The left ventricle has normal function. The left ventricle has no regional wall motion abnormalities. The left ventricular internal  cavity size was normal in size. There is  mild left ventricular hypertrophy. Left ventricular diastolic parameters are consistent with Grade I diastolic dysfunction (impaired relaxation). Indeterminate filling pressures.  Right Ventricle: The right ventricular size is normal. No increase in right ventricular wall thickness. Right ventricular systolic function is normal. Tricuspid regurgitation signal is inadequate for assessing PA pressure. Left Atrium: Left atrial size was normal in size. Right Atrium: Right atrial size was normal in size. Pericardium: A small pericardial effusion is present. The pericardial effusion is anterior to the right ventricle. The pericardial effusion appears to contain mixed echogenic material. There is no evidence of cardiac tamponade. Mitral Valve: The mitral valve is normal in structure. No evidence of mitral valve regurgitation. Tricuspid Valve: The tricuspid valve is grossly normal. Tricuspid valve regurgitation is trivial. Aortic Valve: The aortic valve is tricuspid. Aortic valve regurgitation is not visualized. Mild aortic valve sclerosis is present, with no evidence of aortic valve stenosis. Pulmonic Valve: The pulmonic valve was normal in structure. Pulmonic valve regurgitation is not visualized. Aorta: The aortic root and ascending aorta are structurally normal, with no evidence of dilitation. Venous: The inferior vena cava is normal in size with greater than 50% respiratory variability, suggesting right atrial pressure of 3 mmHg. IAS/Shunts: No atrial level shunt detected by color flow Doppler.  LEFT VENTRICLE PLAX 2D LVIDd:         4.83 cm      Diastology LVIDs:         3.25 cm      LV e' medial:    4.03 cm/s LV PW:         0.97 cm      LV E/e' medial:  17.5 LV IVS:        1.03 cm      LV e' lateral:   5.33 cm/s LVOT diam:     2.20 cm      LV E/e' lateral: 13.2 LV SV:         70 LV SV Index:   36 LVOT Area:     3.80 cm  LV Volumes (MOD) LV vol d, MOD A2C: 101.0 ml LV vol d,  MOD A4C: 113.0 ml LV vol s, MOD A2C: 52.6 ml LV vol s, MOD A4C: 46.7 ml LV SV MOD A2C:     48.4 ml LV SV MOD A4C:     113.0 ml LV SV MOD BP:      54.7 ml RIGHT VENTRICLE TAPSE (M-mode): 1.1 cm LEFT ATRIUM             Index       RIGHT ATRIUM           Index LA diam:        3.75 cm 1.90 cm/m  RA Area:     14.00 cm LA Vol (A2C):   38.1 ml 19.30 ml/m RA Volume:   36.60 ml  18.54 ml/m LA Vol (A4C):   52.6 ml 26.64 ml/m LA Biplane Vol: 45.5 ml 23.05 ml/m  AORTIC VALVE LVOT Vmax:   106.00 cm/s LVOT Vmean:  60.100 cm/s LVOT VTI:    0.185 m  AORTA Ao Root diam: 3.27 cm Ao Asc diam:  3.50 cm MITRAL VALVE MV Area (PHT): 3.60 cm     SHUNTS MV Decel Time: 211 msec     Systemic VTI:  0.18 m MV E velocity: 70.40 cm/s   Systemic Diam: 2.20 cm MV A velocity: 106.00 cm/s MV E/A ratio:  0.66 Lyman Bishop MD Electronically signed by Lyman Bishop MD Signature Date/Time: 02/19/2021/12:57:07 PM    Final         Scheduled Meds:  amLODipine  10 mg Oral Daily   aspirin EC  81 mg Oral Daily   atorvastatin  40 mg Oral Daily   clopidogrel  75 mg Oral Daily   enoxaparin (LOVENOX) injection  40 mg Subcutaneous Q24H   finasteride  5 mg Oral QHS   fluticasone furoate-vilanterol  1 puff Inhalation Daily   insulin aspart  0-5 Units Subcutaneous QHS   insulin aspart  0-9 Units Subcutaneous TID WC   LORazepam  0.5 mg Intravenous Once   losartan  100 mg Oral Daily   nicotine  21 mg Transdermal Daily   pantoprazole  40 mg Oral Daily   tamsulosin  0.4 mg Oral Daily   topiramate  75 mg Oral QHS   umeclidinium bromide  1 puff Inhalation Daily   Continuous Infusions:  sodium chloride 75 mL/hr at 02/19/21 1215     LOS: 0 days    Time spent:     Georgette Shell, MD  02/20/2021, 1:20 PM

## 2021-02-20 NOTE — ED Notes (Signed)
Pt requesting meds for headache.

## 2021-02-20 NOTE — ED Notes (Signed)
The pt keeps pulling his ekg leads off and hs pulse ox he has also pulled out his iv

## 2021-02-21 ENCOUNTER — Ambulatory Visit (HOSPITAL_BASED_OUTPATIENT_CLINIC_OR_DEPARTMENT_OTHER): Admission: RE | Admit: 2021-02-21 | Payer: Medicare Other | Source: Home / Self Care | Admitting: Urology

## 2021-02-21 DIAGNOSIS — I639 Cerebral infarction, unspecified: Secondary | ICD-10-CM | POA: Diagnosis not present

## 2021-02-21 LAB — BASIC METABOLIC PANEL
Anion gap: 12 (ref 5–15)
Anion gap: 5 (ref 5–15)
BUN: 8 mg/dL (ref 8–23)
BUN: 8 mg/dL (ref 8–23)
CO2: 23 mmol/L (ref 22–32)
CO2: 24 mmol/L (ref 22–32)
Calcium: 8.4 mg/dL — ABNORMAL LOW (ref 8.9–10.3)
Calcium: 8.7 mg/dL — ABNORMAL LOW (ref 8.9–10.3)
Chloride: 107 mmol/L (ref 98–111)
Chloride: 109 mmol/L (ref 98–111)
Creatinine, Ser: 0.93 mg/dL (ref 0.61–1.24)
Creatinine, Ser: 1.03 mg/dL (ref 0.61–1.24)
GFR, Estimated: >60 mL/min (ref >60)
GFR, Estimated: >60 mL/min (ref >60)
Glucose, Bld: 103 mg/dL — ABNORMAL HIGH (ref 70–99)
Glucose, Bld: 150 mg/dL — ABNORMAL HIGH (ref 70–99)
Potassium: 2.4 mmol/L — CL (ref 3.5–5.1)
Potassium: 3.5 mmol/L (ref 3.5–5.1)
Sodium: 142 mmol/L (ref 135–145)
Sodium: 138 mmol/L (ref 135–145)

## 2021-02-21 LAB — GLUCOSE, CAPILLARY
Glucose-Capillary: 118 mg/dL — ABNORMAL HIGH (ref 70–99)
Glucose-Capillary: 151 mg/dL — ABNORMAL HIGH (ref 70–99)
Glucose-Capillary: 152 mg/dL — ABNORMAL HIGH (ref 70–99)
Glucose-Capillary: 100 mg/dL — ABNORMAL HIGH (ref 70–99)

## 2021-02-21 LAB — MAGNESIUM
Magnesium: 1.4 mg/dL — ABNORMAL LOW (ref 1.7–2.4)
Magnesium: 2 mg/dL (ref 1.7–2.4)

## 2021-02-21 SURGERY — TURBT (TRANSURETHRAL RESECTION OF BLADDER TUMOR)
Anesthesia: General

## 2021-02-21 MED ORDER — MAGNESIUM SULFATE 2 GM/50ML IV SOLN
2.0000 g | Freq: Once | INTRAVENOUS | Status: AC
  Administered 2021-02-21: 2 g via INTRAVENOUS
  Filled 2021-02-21: qty 50

## 2021-02-21 MED ORDER — ZOLPIDEM TARTRATE 5 MG PO TABS
5.0000 mg | ORAL_TABLET | Freq: Every day | ORAL | Status: DC
  Administered 2021-02-21 – 2021-02-23 (×3): 5 mg via ORAL
  Filled 2021-02-21 (×3): qty 1

## 2021-02-21 MED ORDER — POTASSIUM CHLORIDE 10 MEQ/100ML IV SOLN
10.0000 meq | INTRAVENOUS | Status: AC
  Administered 2021-02-21 (×6): 10 meq via INTRAVENOUS
  Filled 2021-02-21 (×6): qty 100

## 2021-02-21 MED ORDER — POTASSIUM CHLORIDE CRYS ER 20 MEQ PO TBCR
40.0000 meq | EXTENDED_RELEASE_TABLET | ORAL | Status: AC
Start: 2021-02-21 — End: 2021-02-21
  Administered 2021-02-21 (×2): 40 meq via ORAL
  Filled 2021-02-21 (×2): qty 2

## 2021-02-21 MED ORDER — LOPERAMIDE HCL 2 MG PO CAPS
2.0000 mg | ORAL_CAPSULE | Freq: Three times a day (TID) | ORAL | Status: DC | PRN
  Administered 2021-02-21: 2 mg via ORAL
  Filled 2021-02-21: qty 1

## 2021-02-21 NOTE — Progress Notes (Signed)
PROGRESS NOTE    Wayne [name]  NTI:[id number] DOB: 1945/11/11 DOA: 02/18/2021 PCP: Cipriano Mile, NP   Brief Narrative: 75 year old white male with a history of COPD, hypertension, type 2 diabetes, hyperlipidemia, tobacco abuse presents the ER today at the behest of his family doctor.  Patient states that on Monday afternoon February 17, 2021, about 12 PM, patient was outside in his yard doing some yard work.  He had a sudden loss of strength in his right leg.  He also felt numb in his right leg.  He thought the symptoms would go away.  Patient carried on for the rest of his day.  Did not have any slurred speech.  Not have any falls.  He let his wife know and she called the doctor the next day on Tuesday.  When the doctor called him back on Wednesday, the patient still having symptoms.  PCP recommended patient go to the hospital ER for evaluation.   On arrival to ER temp was 98.1 heart rate 87 blood pressure 139/77 sats 98%.   Lab evaluation showed serum potassium 3.0.  Glucose 117.  BUN of 11.  Creatinine 0.88.  Hemoglobin of 13.8.   MRI of the brain with and without contrast demonstrated 1.2 cm focus of diffusion abnormality involving the left pons consistent with acute ischemic infarct.  No hemorrhage or mass-effect.   EKG demonstrated sinus bradycardia.  There is nonspecific ST-T wave changes.   Due to the acute ischemic stroke noted on MRI, [facility] hospitalist contacted for admission.  Patient out of tPA window due to his symptoms starting on February 17, 2021.    ED Course: labwork unremarkable except for hypokalemia. MRI brain showed pontine stroke.    Assessment & Plan:   Principal Problem:   Left pontine CVA ([place]) Active Problems:   Essential hypertension   Mixed hyperlipidemia   Diabetes mellitus without complication (HCC)   Other emphysema (HCC)   Tobacco abuse   Left pontine stroke ([place])   TIA (transient ischemic attack)   Left pontine CVA (HCC)-MRI brain left  pontine stroke. Ct head and neck no LVO.EF 60% LDL 58 CR 0.80 A1C 5.8  ON ASA AND PLAVIX FOR 3 WEEKS THEN ASA ALONE CIR CONSULTED    Essential hypertension-continue Norvasc and Cozaar  Mixed hyperlipidemia  Continue lipitor 40 mg daily.   Diabetes mellitus without complication ([place]) CONTINUE SSI   COPD STABLE   Tobacco abuse Advised to stop smoking. Nicotine patch.  BPH continue Flomax  Severe hypokalemia with potassium of 2.4 and low magnesium being repleted recheck levels again later today and tomorrow.  Insomnia restart Lunesta  Estimated body mass index is 26.86 kg/m as calculated from the following:   Height as of 10/17/20: 5\' 7"  (1.702 m).   Weight as of this encounter: 77.8 kg.  DVT prophylaxis: Lovenox  code Status: Full code  family Communication: Discussed with wife at bedside disposition Plan:  Status is: Inpatient  Remains inpatient appropriate because:Ongoing active pain requiring inpatient pain management and Inpatient level of care appropriate due to severity of illness  Dispo: The patient is from: Home              Anticipated d/c is to: SNF              Patient currently is not medically stable to d/c.   Difficult to place patient No  Consultants: NEURO   Procedures: NONE Antimicrobials: NONE   Subjective: Sitting up in chair he is getting  IV potassium replaced Anxious to go home soon wife by the bedside seen by CIR work-up in progress for rehabilitation. Right lower extremity with tingling numbness  Objective: Vitals:   02/21/21 0716 02/21/21 1149 02/21/21 1155 02/21/21 1455  BP: (!) 146/81 127/80  122/74  Pulse: (!) 58 63  79  Resp: 20 20  18   Temp: 97.7 F (36.5 C) (!) 97.5 F (36.4 C)  97.7 F (36.5 C)  TempSrc: Oral Oral  Oral  SpO2: 97% 99% 98% 98%  Weight:        Intake/Output Summary (Last 24 hours) at 02/21/2021 1522 Last data filed at 02/21/2021 0606 Gross per 24 hour  Intake 546.69 ml  Output --  Net 546.69 ml   Filed  Weights   02/21/21 0450  Weight: 77.8 kg    Examination:  General exam: Appears calm and comfortable  Respiratory system: Clear to auscultation. Respiratory effort normal. Cardiovascular system: S1 & S2 heard, RRR. No JVD, murmurs, rubs, gallops or clicks. No pedal edema. Gastrointestinal system: Abdomen is nondistended, soft and nontender. No organomegaly or masses felt. Normal bowel sounds heard. Central nervous system: Alert and oriented.  Right lower extremity 4 x 5 with decreased sensation  extremities: No edema. Skin: No rashes, lesions or ulcers Psychiatry: Judgement and insight appear normal. Mood & affect appropriate.     Data Reviewed: I have personally reviewed following labs and imaging studies  CBC: Recent Labs  Lab 02/18/21 1221 02/18/21 1232 02/19/21 1843  WBC 6.8  --  7.6  NEUTROABS 4.6  --   --   HGB 13.8 13.9 14.0  HCT 41.2 41.0 40.9  MCV 90.7  --  89.3  PLT 245  --  034    Basic Metabolic Panel: Recent Labs  Lab 02/18/21 1221 02/18/21 1232 02/19/21 1843 02/21/21 0218 02/21/21 0219  NA 138 143  --   --  142  K 3.0* 3.1*  --   --  2.4*  CL 108 107  --   --  107  CO2 21*  --   --   --  23  GLUCOSE 117* 113*  --   --  103*  BUN 11 10  --   --  8  CREATININE 0.88 0.80 0.85  --  0.93  CALCIUM 9.1  --   --   --  8.4*  MG  --   --  1.4* 1.4*  --     GFR: Estimated Creatinine Clearance: 62.2 mL/min (by C-G formula based on SCr of 0.93 mg/dL). Liver Function Tests: Recent Labs  Lab 02/18/21 1221  AST 13*  ALT 13  ALKPHOS 58  BILITOT 1.0  PROT 6.9  ALBUMIN 3.7    No results for input(s): LIPASE, AMYLASE in the last 168 hours. No results for input(s): AMMONIA in the last 168 hours. Coagulation Profile: Recent Labs  Lab 02/18/21 1221  INR 1.2    Cardiac Enzymes: No results for input(s): CKTOTAL, CKMB, CKMBINDEX, TROPONINI in the last 168 hours. BNP (last 3 results) No results for input(s): PROBNP in the last 8760  hours. HbA1C: Recent Labs    02/19/21 0700  HGBA1C 5.8*    CBG: Recent Labs  Lab 02/20/21 1212 02/20/21 1719 02/20/21 2102 02/21/21 0606 02/21/21 1152  GLUCAP 106* 96 155* 118* 151*    Lipid Profile: Recent Labs    02/19/21 0700  CHOL 106  HDL 32*  LDLCALC 58  TRIG 81  CHOLHDL 3.3    Thyroid Function Tests:  No results for input(s): TSH, T4TOTAL, FREET4, T3FREE, THYROIDAB in the last 72 hours. Anemia Panel: No results for input(s): VITAMINB12, FOLATE, FERRITIN, TIBC, IRON, RETICCTPCT in the last 72 hours. Sepsis Labs: No results for input(s): PROCALCITON, LATICACIDVEN in the last 168 hours.  Recent Results (from the past 240 hour(s))  Resp Panel by RT-PCR (Flu A&B, Covid) Nasopharyngeal Swab     Status: None   Collection Time: 02/19/21  3:03 AM   Specimen: Nasopharyngeal Swab; Nasopharyngeal(NP) swabs in vial transport medium  Result Value Ref Range Status   SARS Coronavirus 2 by RT PCR NEGATIVE NEGATIVE Final    Comment: (NOTE) SARS-CoV-2 target nucleic acids are NOT DETECTED.  The SARS-CoV-2 RNA is generally detectable in upper respiratory specimens during the acute phase of infection. The lowest concentration of SARS-CoV-2 viral copies this assay can detect is 138 copies/mL. A negative result does not preclude SARS-Cov-2 infection and should not be used as the sole basis for treatment or other patient management decisions. A negative result may occur with  improper specimen collection/handling, submission of specimen other than nasopharyngeal swab, presence of viral mutation(s) within the areas targeted by this assay, and inadequate number of viral copies(<138 copies/mL). A negative result must be combined with clinical observations, patient history, and epidemiological information. The expected result is Negative.  Fact Sheet for Patients:  EntrepreneurPulse.com.au  Fact Sheet for Healthcare Providers:   IncredibleEmployment.be  This test is no t yet approved or cleared by the [place] FDA and  has been authorized for detection and/or diagnosis of SARS-CoV-2 by FDA under an Emergency Use Authorization (EUA). This EUA will remain  in effect (meaning this test can be used) for the duration of the COVID-19 declaration under Section 564(b)(1) of the Act, 21 U.S.C.section 360bbb-3(b)(1), unless the authorization is terminated  or revoked sooner.       Influenza A by PCR NEGATIVE NEGATIVE Final   Influenza B by PCR NEGATIVE NEGATIVE Final    Comment: (NOTE) The Xpert Xpress SARS-CoV-2/FLU/RSV plus assay is intended as an aid in the diagnosis of influenza from Nasopharyngeal swab specimens and should not be used as a sole basis for treatment. Nasal washings and aspirates are unacceptable for Xpert Xpress SARS-CoV-2/FLU/RSV testing.  Fact Sheet for Patients: EntrepreneurPulse.com.au  Fact Sheet for Healthcare Providers: IncredibleEmployment.be  This test is not yet approved or cleared by the [place] FDA and has been authorized for detection and/or diagnosis of SARS-CoV-2 by FDA under an Emergency Use Authorization (EUA). This EUA will remain in effect (meaning this test can be used) for the duration of the COVID-19 declaration under Section 564(b)(1) of the Act, 21 U.S.C. section 360bbb-3(b)(1), unless the authorization is terminated or revoked.  Performed at [facility], [place] [address], [place], [place] [address]           Radiology Studies: No results found.      Scheduled Meds:  amLODipine  10 mg Oral Daily   aspirin EC  81 mg Oral Daily   atorvastatin  40 mg Oral Daily   clopidogrel  75 mg Oral Daily   enoxaparin (LOVENOX) injection  40 mg Subcutaneous Q24H   finasteride  5 mg Oral QHS   fluticasone furoate-vilanterol  1 puff Inhalation Daily   insulin aspart  0-5 Units Subcutaneous QHS    insulin aspart  0-9 Units Subcutaneous TID WC   LORazepam  0.5 mg Intravenous Once   losartan  100 mg Oral Daily   nicotine  21 mg  Transdermal Daily   pantoprazole  40 mg Oral Daily   tamsulosin  0.4 mg Oral Daily   topiramate  75 mg Oral QHS   traZODone  50 mg Oral QHS   umeclidinium bromide  1 puff Inhalation Daily   zolpidem  5 mg Oral QHS   Continuous Infusions:     LOS: 1 day    Time spent: [place]    Georgette Shell, MD  02/21/2021, 3:22 PM

## 2021-02-21 NOTE — Plan of Care (Signed)

## 2021-02-21 NOTE — Progress Notes (Signed)
Inpatient Rehab Admissions Coordinator:   Met with patient and his family (spouse and son) at the bedside to discuss recommendations for CIR.  I explained 3 hrs/day of therapy with goal of discharge home, likely supervision level.  I also reviewed average length of stay to be about 2 weeks, which pt did not agree to.  He would like to d/c home as soon as possible.  Pt's son more realistic about rehab needs.  Pt was agreeable to letting me start insurance authorization.  I will likely not hear back over the weekend but will f/u with them on Monday.   Shann Medal, PT, DPT Admissions Coordinator [phone] 02/21/21  11:48 AM

## 2021-02-21 NOTE — Progress Notes (Signed)
Critical K =2.4, on call notified via page.

## 2021-02-21 NOTE — Progress Notes (Signed)
Physical Therapy Treatment Patient Details Name: Wayne [name] MRN: [id number] DOB: 1945/10/24 Today's Date: 02/21/2021   History of Present Illness 75 year old white male admitted due to R LE weakness and numbness found to have L pontine infarct.  Patient with chronic R UE elbow contracture. PMH: COPD, DM, HTN, erectile dysfunction, Meniere's disease, HLD, thoracic aortic aneurysm and tobacco abuse.    PT Comments    Pt received in chair, agreeable to therapy session and motivated to progress mobility. Pt impulsive and needing mod safety cues for transfer and gait progression and had chair follow for safety. Pt c/o R heel pain, RN notified. Pt would benefit from R platform attachment on RW next session due to RUE contracture for improved assistive device use and posture. Pt continues to benefit from PT services to progress toward functional mobility goals.    Recommendations for follow up therapy are one component of a multi-disciplinary discharge planning process, led by the attending physician.  Recommendations may be updated based on patient status, additional functional criteria and insurance authorization.  Follow Up Recommendations  CIR     Equipment Recommendations  Rolling walker with 5" wheels;Other (comment) (R platform attachment for RW)    Recommendations for Other Services       Precautions / Restrictions Precautions Precautions: Fall Precaution Comments: RUE contracture Restrictions Weight Bearing Restrictions: No Other Position/Activity Restrictions: R elbow fixed at 90 degrees flexion - chronic     Mobility  Bed Mobility Overal bed mobility: Needs Assistance    General bed mobility comments: In recliner upon entry    Transfers Overall transfer level: Needs assistance Equipment used: Rolling walker (2 wheeled) Transfers: Sit to/from [facility] Sit to Stand: Min guard         General transfer comment: safety cues for technique; pt  impulsive to stand prior to therapist cueing him, needed multiple reminders to wait  Ambulation/Gait Ambulation/Gait assistance: Min assist;Mod assist Gait Distance (Feet): 70 Feet (29ft, seated break, 14ft) Assistive device: Rolling walker (2 wheeled) Gait Pattern/deviations: Step-to pattern;Step-through pattern;Decreased stride length;Drifts right/left Gait velocity: fair speed but anticipate decreased from his baseline; limited assessment due to need to physically assist pt   General Gait Details: Noted instability of RLE, especially with increased distance. Min to mod A for steadying A. Required seated rest in between gait trials. Cues for step length and heel strike. Pt would benefit from platform attachment on R side of RW due to chronic R elbow contracture for improved posture; son pushing chair behind him for safety    Modified Rankin (Stroke Patients Only) Modified Rankin (Stroke Patients Only) Pre-Morbid Rankin Score: No significant disability Modified Rankin: Moderately severe disability     Balance Overall balance assessment: Needs assistance Sitting-balance support: No upper extremity supported Sitting balance-Leahy Scale: Good     Standing balance support: Bilateral upper extremity supported Standing balance-Leahy Scale: Poor Standing balance comment: Reliant on UE and external support                            Cognition Arousal/Alertness: Awake/alert Behavior During Therapy: WFL for tasks assessed/performed Overall Cognitive Status: Within Functional Limits for tasks assessed                       Memory: Decreased short-term memory   Safety/Judgement: Decreased awareness of safety;Decreased awareness of deficits     General Comments: HOH impacts listening and comprehension, pt very motivated  Exercises General Exercises - Lower Extremity Quad Sets: AROM;Both;5 reps;Other (comment) (reclined) Long Arc Quad: AROM;Both;10  reps;Seated (visual cues for technique/slow and controlled) Hip Flexion/Marching: AROM;Both;10 reps;Seated    General Comments General comments (skin integrity, edema, etc.): slight redness to R instep/medial calcaneous, RN notified      Pertinent Vitals/Pain Pain Assessment: Faces Faces Pain Scale: Hurts little more Pain Location: c/o R heel pain and chronic neuropathy, some redness noted on R heel/instep but not "boggy" feeling Pain Descriptors / Indicators: Discomfort;Tingling Pain Intervention(s): Monitored during session;Repositioned;Other (comment) (encouraged family to float his heels when in bed/chair due to decreased sensation and frequent pain)    Home Living                      Prior Function            PT Goals (current goals can now be found in the care plan section) Acute Rehab PT Goals Patient Stated Goal: patient perfers to go home, but understands he needs rehab PT Goal Formulation: With patient/family Time For Goal Achievement: 03/05/21 Progress towards PT goals: Progressing toward goals    Frequency    Min 4X/week      PT Plan Current plan remains appropriate    Co-evaluation              AM-PAC PT "6 Clicks" Mobility   Outcome Measure  Help needed turning from your back to your side while in a flat bed without using bedrails?: A Little Help needed moving from lying on your back to sitting on the side of a flat bed without using bedrails?: A Little Help needed moving to and from a bed to a chair (including a wheelchair)?: A Little Help needed standing up from a chair using your arms (e.g., wheelchair or bedside chair)?: A Little Help needed to walk in hospital room?: A Lot Help needed climbing 3-5 steps with a railing? : Total 6 Click Score: 15    End of Session Equipment Utilized During Treatment: Gait belt Activity Tolerance: Patient tolerated treatment well Patient left: in chair;with call bell/phone within reach;with chair  alarm set;with family/visitor present Nurse Communication: Mobility status;Other (comment) (R heel pain/redness) PT Visit Diagnosis: Other abnormalities of gait and mobility (R26.89);Other symptoms and signs involving the nervous system (R29.898);Muscle weakness (generalized) (M62.81);Hemiplegia and hemiparesis Hemiplegia - Right/Left: Right Hemiplegia - dominant/non-dominant: Non-dominant Hemiplegia - caused by: Cerebral infarction     Time: 1707-1730 PT Time Calculation (min) (ACUTE ONLY): 23 min  Charges:  $Gait Training: 8-22 mins $Therapeutic Exercise: 8-22 mins                     Yosiel Thieme P., PTA Acute Rehabilitation Services Pager: [phone] Office: [place] 02/21/2021, 6:28 PM

## 2021-02-22 DIAGNOSIS — I639 Cerebral infarction, unspecified: Secondary | ICD-10-CM | POA: Diagnosis not present

## 2021-02-22 LAB — GLUCOSE, CAPILLARY
Glucose-Capillary: 113 mg/dL — ABNORMAL HIGH (ref 70–99)
Glucose-Capillary: 204 mg/dL — ABNORMAL HIGH (ref 70–99)
Glucose-Capillary: 123 mg/dL — ABNORMAL HIGH (ref 70–99)
Glucose-Capillary: 139 mg/dL — ABNORMAL HIGH (ref 70–99)

## 2021-02-22 LAB — BASIC METABOLIC PANEL
Anion gap: 7 (ref 5–15)
BUN: 7 mg/dL — ABNORMAL LOW (ref 8–23)
CO2: 23 mmol/L (ref 22–32)
Calcium: 8.5 mg/dL — ABNORMAL LOW (ref 8.9–10.3)
Chloride: 110 mmol/L (ref 98–111)
Creatinine, Ser: 0.94 mg/dL (ref 0.61–1.24)
GFR, Estimated: >60 mL/min (ref >60)
Glucose, Bld: 108 mg/dL — ABNORMAL HIGH (ref 70–99)
Potassium: 3.2 mmol/L — ABNORMAL LOW (ref 3.5–5.1)
Sodium: 140 mmol/L (ref 135–145)

## 2021-02-22 MED ORDER — POTASSIUM CHLORIDE CRYS ER 20 MEQ PO TBCR
40.0000 meq | EXTENDED_RELEASE_TABLET | Freq: Once | ORAL | Status: AC
  Administered 2021-02-22: 40 meq via ORAL
  Filled 2021-02-22: qty 2

## 2021-02-22 NOTE — Progress Notes (Signed)
Physical Therapy Treatment Patient Details Name: Wayne [name] MRN: [id number] DOB: September 13, 1945 Today's Date: 02/22/2021   History of Present Illness 75 year old white male admitted due to R LE weakness and numbness found to have L pontine infarct. Patient with chronic R UE elbow contracture. PMH: COPD, DM, HTN, erectile dysfunction, Meniere's disease, HLD, thoracic aortic aneurysm and tobacco abuse.    PT Comments    Pt received in chair, agreeable to therapy session and with good participation and tolerance for gait progression with R platform attachment on RW. Pt much steadier today, needing min guard to minA with PF RW. Pt did perform short gait task in room with cane but less steady and needed minA with this device. Pt making good progress toward goals, will plan to initiate stair training next session. Pt continues to benefit from PT services to progress toward functional mobility goals.    Recommendations for follow up therapy are one component of a multi-disciplinary discharge planning process, led by the attending physician.  Recommendations may be updated based on patient status, additional functional criteria and insurance authorization.  Follow Up Recommendations  CIR     Equipment Recommendations  Rolling walker with 5" wheels;Other (comment) (R platform attachment for RW)    Recommendations for Other Services       Precautions / Restrictions Precautions Precautions: Fall Precaution Comments: RUE contracture Required Braces or Orthoses:  (R platform attachment for RW) Restrictions Weight Bearing Restrictions: No Other Position/Activity Restrictions: R elbow fixed at 90 degrees flexion - chronic     Mobility  Bed Mobility      General bed mobility comments: In recliner upon entry    Transfers Overall transfer level: Needs assistance Equipment used: Rolling walker (2 wheeled) Transfers: Sit to/from [facility] Sit to Stand: Min  guard;Supervision         General transfer comment: safety cues for technique; pt impulsive to stand, sometimes ignores cues for reaching back to chair  Ambulation/Gait Ambulation/Gait assistance: Min assist;Min guard Gait Distance (Feet): 100 Feet (x2 with seated break, then 20t in room with cane) Assistive device: Right platform walker;Straight cane Gait Pattern/deviations: Decreased stride length;Drifts right/left;Decreased dorsiflexion - right;Decreased dorsiflexion - left;Step-through pattern;Decreased step length - right Gait velocity: fair but decreased   General Gait Details: 2 episodes of minA while turning, mostly min guard for forward ambulation using R PF RW; Required seated rest in between gait trials. Cues for step length and heel strike. Pt with improved upright posture and step length when using R platform attachment. short gait trial in room with SPC and pt with increased postural instability needing minA, decreased stepping with RLE using cane and increased fall risk with this device so continue to recommend PF RW     Modified Rankin (Stroke Patients Only) Modified Rankin (Stroke Patients Only) Pre-Morbid Rankin Score: No significant disability Modified Rankin: Moderately severe disability     Balance Overall balance assessment: Needs assistance Sitting-balance support: No upper extremity supported Sitting balance-Leahy Scale: Good     Standing balance support: Bilateral upper extremity supported Standing balance-Leahy Scale: Poor Standing balance comment: Reliant on BUE support, supervision with static standing at RW and up to minA for dynamic standing at [employer] Arousal/Alertness: Awake/alert Behavior During Therapy: [facility] for tasks assessed/performed;Impulsive Overall Cognitive Status: Within Functional Limits for tasks assessed      Memory: Decreased short-term memory   Safety/Judgement: Decreased awareness of safety;Decreased awareness of  deficits  General Comments: HOH impacts listening and comprehension, pt very motivated, mild impulsivity      Exercises      General Comments General comments (skin integrity, edema, etc.): HR WFL, DOE to 2/4 with exertion, SpO2 98% during gait trial      Pertinent Vitals/Pain Pain Assessment: Faces Faces Pain Scale: Hurts a little bit Pain Location: c/o R LE neuropathy, but less than yesterday Pain Descriptors / Indicators: Discomfort;Tingling Pain Intervention(s): Monitored during session     PT Goals (current goals can now be found in the care plan section) Acute Rehab PT Goals Patient Stated Goal: patient prefers to go home, but understands he needs rehab and family encouraging inpatient rehab. PT Goal Formulation: With patient/family Time For Goal Achievement: 03/05/21 Progress towards PT goals: Progressing toward goals    Frequency    Min 4X/week      PT Plan Current plan remains appropriate       AM-PAC PT "6 Clicks" Mobility   Outcome Measure  Help needed turning from your back to your side while in a flat bed without using bedrails?: A Little Help needed moving from lying on your back to sitting on the side of a flat bed without using bedrails?: A Little Help needed moving to and from a bed to a chair (including a wheelchair)?: A Little Help needed standing up from a chair using your arms (e.g., wheelchair or bedside chair)?: A Little Help needed to walk in hospital room?: A Little Help needed climbing 3-5 steps with a railing? : A Lot 6 Click Score: 17    End of Session Equipment Utilized During Treatment: Gait belt Activity Tolerance: Patient tolerated treatment well Patient left: in chair;with call bell/phone within reach;with chair alarm set;with family/visitor present Nurse Communication: Mobility status PT Visit Diagnosis: Other abnormalities of gait and mobility (R26.89);Other symptoms and signs involving the nervous system (R29.898);Muscle  weakness (generalized) (M62.81);Hemiplegia and hemiparesis Hemiplegia - Right/Left: Right Hemiplegia - dominant/non-dominant: Non-dominant Hemiplegia - caused by: Cerebral infarction     Time: 1020-1049 PT Time Calculation (min) (ACUTE ONLY): 29 min  Charges:  $Gait Training: 23-37 mins                     Francy Mcilvaine P., PTA Acute Rehabilitation Services Pager: [phone] Office: [place] 02/22/2021, 11:41 AM

## 2021-02-22 NOTE — Progress Notes (Signed)
PROGRESS NOTE    Wayne [name]  NWG:[id number] DOB: [birth year] DOA: 02/18/2021 PCP: Cipriano Mile, NP   Brief Narrative: 75 year old white male with a history of COPD, hypertension, type 2 diabetes, hyperlipidemia, tobacco abuse presents the ER today at the behest of his family doctor.  Patient states that on Monday afternoon February 17, 2021, about 12 PM, patient was outside in his yard doing some yard work.  He had a sudden loss of strength in his right leg.  He also felt numb in his right leg.  He thought the symptoms would go away.  Patient carried on for the rest of his day.  Did not have any slurred speech.  Not have any falls.  He let his wife know and she called the doctor the next day on Tuesday.  When the doctor called him back on Wednesday, the patient still having symptoms.  PCP recommended patient go to the hospital ER for evaluation.   On arrival to ER temp was 98.1 heart rate 87 blood pressure 139/77 sats 98%.   Lab evaluation showed serum potassium 3.0.  Glucose 117.  BUN of 11.  Creatinine 0.88.  Hemoglobin of 13.8.   MRI of the brain with and without contrast demonstrated 1.2 cm focus of diffusion abnormality involving the left pons consistent with acute ischemic infarct.  No hemorrhage or mass-effect.   EKG demonstrated sinus bradycardia.  There is nonspecific ST-T wave changes.   Due to the acute ischemic stroke noted on MRI, [facility] hospitalist contacted for admission.  Patient out of tPA window due to his symptoms starting on February 17, 2021.    ED Course: labwork unremarkable except for hypokalemia. MRI brain showed pontine stroke.    Assessment & Plan:   Principal Problem:   Left pontine CVA ([place]) Active Problems:   Essential hypertension   Mixed hyperlipidemia   Diabetes mellitus without complication (HCC)   Other emphysema (HCC)   Tobacco abuse   Left pontine stroke ([place])   TIA (transient ischemic attack)   Left pontine CVA (HCC)-MRI brain left  pontine stroke. Ct head and neck no LVO.EF 60% LDL 58 CR 0.80 A1C 5.8  ON ASA AND PLAVIX FOR 3 WEEKS THEN ASA ALONE CIR CONSULTED    Essential hypertension-continue Norvasc and Cozaar  Mixed hyperlipidemia  Continue lipitor 40 mg daily.   Diabetes mellitus without complication ([place]) CONTINUE SSI   COPD STABLE   Tobacco abuse Advised to stop smoking. Nicotine patch.  BPH continue Flomax  Severe hypokalemia with potassium of 2.4 and low magnesium being repleted recheck levels again later today and tomorrow.  Insomnia restart Lunesta  Estimated body mass index is 27.62 kg/m as calculated from the following:   Height as of 10/17/20: 5\' 7"  (1.702 m).   Weight as of this encounter: 80 kg.  DVT prophylaxis: Lovenox  code Status: Full code  family Communication: Discussed with wife at bedside disposition Plan:  Status is: Inpatient  Remains inpatient appropriate because:Ongoing active pain requiring inpatient pain management and Inpatient level of care appropriate due to severity of illness  Dispo: The patient is from: Home              Anticipated d/c is to: SNF              Patient currently is not medically stable to d/c.   Difficult to place patient No  Consultants: NEURO   Procedures: NONE Antimicrobials: NONE   Subjective:  Wife by the bedside patient sitting  up by the side of the bed anxious to walk with therapy today Objective: Vitals:   02/22/21 0043 02/22/21 0359 02/22/21 0920 02/22/21 1202  BP: (!) 159/79 (!) 173/75 (!) 146/86 122/71  Pulse: 61 (!) 55 (!) 57 61  Resp: 18 19 20 18   Temp: 98.7 F (37.1 C) 98.9 F (37.2 C) 98 F (36.7 C) 98.2 F (36.8 C)  TempSrc: Oral Oral Oral Oral  SpO2: 96% 96% 98% 99%  Weight: 80 kg       Intake/Output Summary (Last 24 hours) at 02/22/2021 1412 Last data filed at 02/21/2021 1700 Gross per 24 hour  Intake 320 ml  Output --  Net 320 ml    Filed Weights   02/21/21 0450 02/22/21 0043  Weight: 77.8 kg 80 kg     Examination:  General exam: Appears calm and comfortable  Respiratory system: Clear to auscultation. Respiratory effort normal. Cardiovascular system: S1 & S2 heard, RRR. No JVD, murmurs, rubs, gallops or clicks. No pedal edema. Gastrointestinal system: Abdomen is nondistended, soft and nontender. No organomegaly or masses felt. Normal bowel sounds heard. Central nervous system: Alert and oriented.  Right lower extremity 4 x 5 with decreased sensation  extremities: No edema. Skin: No rashes, lesions or ulcers Psychiatry: Judgement and insight appear normal. Mood & affect appropriate.     Data Reviewed: I have personally reviewed following labs and imaging studies  CBC: Recent Labs  Lab 02/18/21 1221 02/18/21 1232 02/19/21 1843  WBC 6.8  --  7.6  NEUTROABS 4.6  --   --   HGB 13.8 13.9 14.0  HCT 41.2 41.0 40.9  MCV 90.7  --  89.3  PLT 245  --  355    Basic Metabolic Panel: Recent Labs  Lab 02/18/21 1221 02/18/21 1232 02/19/21 1843 02/21/21 0218 02/21/21 0219 02/21/21 1532 02/22/21 0317  NA 138 143  --   --  142 138 140  K 3.0* 3.1*  --   --  2.4* 3.5 3.2*  CL 108 107  --   --  107 109 110  CO2 21*  --   --   --  23 24 23   GLUCOSE 117* 113*  --   --  103* 150* 108*  BUN 11 10  --   --  8 8 7*  CREATININE 0.88 0.80 0.85  --  0.93 1.03 0.94  CALCIUM 9.1  --   --   --  8.4* 8.7* 8.5*  MG  --   --  1.4* 1.4*  --  2.0  --     GFR: Estimated Creatinine Clearance: 66.7 mL/min (by C-G formula based on SCr of 0.94 mg/dL). Liver Function Tests: Recent Labs  Lab 02/18/21 1221  AST 13*  ALT 13  ALKPHOS 58  BILITOT 1.0  PROT 6.9  ALBUMIN 3.7    No results for input(s): LIPASE, AMYLASE in the last 168 hours. No results for input(s): AMMONIA in the last 168 hours. Coagulation Profile: Recent Labs  Lab 02/18/21 1221  INR 1.2    Cardiac Enzymes: No results for input(s): CKTOTAL, CKMB, CKMBINDEX, TROPONINI in the last 168 hours. BNP (last 3 results) No  results for input(s): PROBNP in the last 8760 hours. HbA1C: No results for input(s): HGBA1C in the last 72 hours.  CBG: Recent Labs  Lab 02/21/21 1152 02/21/21 1606 02/21/21 2043 02/22/21 0632 02/22/21 1206  GLUCAP 151* 152* 100* 113* 204*    Lipid Profile: No results for input(s): CHOL, HDL, LDLCALC, TRIG, CHOLHDL,  LDLDIRECT in the last 72 hours.  Thyroid Function Tests: No results for input(s): TSH, T4TOTAL, FREET4, T3FREE, THYROIDAB in the last 72 hours. Anemia Panel: No results for input(s): VITAMINB12, FOLATE, FERRITIN, TIBC, IRON, RETICCTPCT in the last 72 hours. Sepsis Labs: No results for input(s): PROCALCITON, LATICACIDVEN in the last 168 hours.  Recent Results (from the past 240 hour(s))  Resp Panel by RT-PCR (Flu A&B, Covid) Nasopharyngeal Swab     Status: None   Collection Time: 02/19/21  3:03 AM   Specimen: Nasopharyngeal Swab; Nasopharyngeal(NP) swabs in vial transport medium  Result Value Ref Range Status   SARS Coronavirus 2 by RT PCR NEGATIVE NEGATIVE Final    Comment: (NOTE) SARS-CoV-2 target nucleic acids are NOT DETECTED.  The SARS-CoV-2 RNA is generally detectable in upper respiratory specimens during the acute phase of infection. The lowest concentration of SARS-CoV-2 viral copies this assay can detect is 138 copies/mL. A negative result does not preclude SARS-Cov-2 infection and should not be used as the sole basis for treatment or other patient management decisions. A negative result may occur with  improper specimen collection/handling, submission of specimen other than nasopharyngeal swab, presence of viral mutation(s) within the areas targeted by this assay, and inadequate number of viral copies(<138 copies/mL). A negative result must be combined with clinical observations, patient history, and epidemiological information. The expected result is Negative.  Fact Sheet for Patients:  EntrepreneurPulse.com.au  Fact Sheet for  Healthcare Providers:  IncredibleEmployment.be  This test is no t yet approved or cleared by the [place] FDA and  has been authorized for detection and/or diagnosis of SARS-CoV-2 by FDA under an Emergency Use Authorization (EUA). This EUA will remain  in effect (meaning this test can be used) for the duration of the COVID-19 declaration under Section 564(b)(1) of the Act, 21 U.S.C.section 360bbb-3(b)(1), unless the authorization is terminated  or revoked sooner.       Influenza A by PCR NEGATIVE NEGATIVE Final   Influenza B by PCR NEGATIVE NEGATIVE Final    Comment: (NOTE) The Xpert Xpress SARS-CoV-2/FLU/RSV plus assay is intended as an aid in the diagnosis of influenza from Nasopharyngeal swab specimens and should not be used as a sole basis for treatment. Nasal washings and aspirates are unacceptable for Xpert Xpress SARS-CoV-2/FLU/RSV testing.  Fact Sheet for Patients: EntrepreneurPulse.com.au  Fact Sheet for Healthcare Providers: IncredibleEmployment.be  This test is not yet approved or cleared by the [place] FDA and has been authorized for detection and/or diagnosis of SARS-CoV-2 by FDA under an Emergency Use Authorization (EUA). This EUA will remain in effect (meaning this test can be used) for the duration of the COVID-19 declaration under Section 564(b)(1) of the Act, 21 U.S.C. section 360bbb-3(b)(1), unless the authorization is terminated or revoked.  Performed at [facility], [place] [address] [place], [place] [address]           Radiology Studies: No results found.      Scheduled Meds:  amLODipine  10 mg Oral Daily   aspirin EC  81 mg Oral Daily   atorvastatin  40 mg Oral Daily   clopidogrel  75 mg Oral Daily   enoxaparin (LOVENOX) injection  40 mg Subcutaneous Q24H   finasteride  5 mg Oral QHS   fluticasone furoate-vilanterol  1 puff Inhalation Daily   insulin aspart  0-5  Units Subcutaneous QHS   insulin aspart  0-9 Units Subcutaneous TID WC   LORazepam  0.5 mg Intravenous Once   losartan  100 mg Oral Daily   nicotine  21 mg Transdermal Daily   pantoprazole  40 mg Oral Daily   tamsulosin  0.4 mg Oral Daily   topiramate  75 mg Oral QHS   traZODone  50 mg Oral QHS   umeclidinium bromide  1 puff Inhalation Daily   zolpidem  5 mg Oral QHS   Continuous Infusions:     LOS: 2 days    Time spent: [place]    Georgette Shell, MD  02/22/2021, 2:12 PM

## 2021-02-23 DIAGNOSIS — I639 Cerebral infarction, unspecified: Secondary | ICD-10-CM | POA: Diagnosis not present

## 2021-02-23 LAB — MAGNESIUM: Magnesium: 1.7 mg/dL (ref 1.7–2.4)

## 2021-02-23 LAB — GLUCOSE, CAPILLARY
Glucose-Capillary: 126 mg/dL — ABNORMAL HIGH (ref 70–99)
Glucose-Capillary: 160 mg/dL — ABNORMAL HIGH (ref 70–99)
Glucose-Capillary: 120 mg/dL — ABNORMAL HIGH (ref 70–99)
Glucose-Capillary: 104 mg/dL — ABNORMAL HIGH (ref 70–99)

## 2021-02-23 LAB — BASIC METABOLIC PANEL
Anion gap: 8 (ref 5–15)
BUN: 8 mg/dL (ref 8–23)
CO2: 23 mmol/L (ref 22–32)
Calcium: 8.9 mg/dL (ref 8.9–10.3)
Chloride: 107 mmol/L (ref 98–111)
Creatinine, Ser: 0.94 mg/dL (ref 0.61–1.24)
GFR, Estimated: >60 mL/min (ref >60)
Glucose, Bld: 166 mg/dL — ABNORMAL HIGH (ref 70–99)
Potassium: 3.4 mmol/L — ABNORMAL LOW (ref 3.5–5.1)
Sodium: 138 mmol/L (ref 135–145)

## 2021-02-23 NOTE — Plan of Care (Signed)
  Problem: Education: Goal: Knowledge of General Education information will improve Description: Including pain rating scale, medication(s)/side effects and non-pharmacologic comfort measures Outcome: Progressing   Problem: Health Behavior/Discharge Planning: Goal: Ability to manage health-related needs will improve Outcome: Progressing   Problem: Clinical Measurements: Goal: Ability to maintain clinical measurements within normal limits will improve Outcome: Progressing Goal: Will remain free from infection Outcome: Progressing Goal: Diagnostic test results will improve Outcome: Progressing Goal: Respiratory complications will improve Outcome: Progressing Goal: Cardiovascular complication will be avoided Outcome: Progressing   Problem: Safety: Goal: Ability to remain free from injury will improve Outcome: Progressing   Problem: Pain Managment: Goal: General experience of comfort will improve Outcome: Progressing   Problem: Skin Integrity: Goal: Risk for impaired skin integrity will decrease Outcome: Progressing

## 2021-02-23 NOTE — Progress Notes (Signed)
PROGRESS NOTE    Wayne [name]  XIP:[id number] DOB: [birth year] DOA: 02/18/2021 PCP: Cipriano Mile, NP   Brief Narrative: 75 year old white male with a history of COPD, hypertension, type 2 diabetes, hyperlipidemia, tobacco abuse presents the ER today at the behest of his family doctor.  Patient states that on Monday afternoon February 17, 2021, about 12 PM, patient was outside in his yard doing some yard work.  He had a sudden loss of strength in his right leg.  He also felt numb in his right leg.  He thought the symptoms would go away.  Patient carried on for the rest of his day.  Did not have any slurred speech.  Not have any falls.  He let his wife know and she called the doctor the next day on Tuesday.  When the doctor called him back on Wednesday, the patient still having symptoms.  PCP recommended patient go to the hospital ER for evaluation.   On arrival to ER temp was 98.1 heart rate 87 blood pressure 139/77 sats 98%.   Lab evaluation showed serum potassium 3.0.  Glucose 117.  BUN of 11.  Creatinine 0.88.  Hemoglobin of 13.8.   MRI of the brain with and without contrast demonstrated 1.2 cm focus of diffusion abnormality involving the left pons consistent with acute ischemic infarct.  No hemorrhage or mass-effect.   EKG demonstrated sinus bradycardia.  There is nonspecific ST-T wave changes.   Due to the acute ischemic stroke noted on MRI, [facility] hospitalist contacted for admission.  Patient out of tPA window due to his symptoms starting on February 17, 2021.    ED Course: labwork unremarkable except for hypokalemia. MRI brain showed pontine stroke.    Assessment & Plan:   Principal Problem:   Left pontine CVA ([place]) Active Problems:   Essential hypertension   Mixed hyperlipidemia   Diabetes mellitus without complication (HCC)   Other emphysema (HCC)   Tobacco abuse   Left pontine stroke ([place])   TIA (transient ischemic attack)   Left pontine CVA (HCC)-MRI brain left  pontine stroke. Ct head and neck no LVO.EF 60% LDL 58 CR 0.80 A1C 5.8  ON ASA AND PLAVIX FOR 3 WEEKS THEN ASA ALONE CIR CONSULTED    Essential hypertension-continue Norvasc and Cozaar Blood pressure 141/78.  Mixed hyperlipidemia  Continue lipitor 40 mg daily.   Diabetes mellitus without complication ([place]) CONTINUE SSI CBG (last 3)  Recent Labs    02/22/21 1725 02/22/21 2125 02/23/21 0616  GLUCAP 123* 139* 126*    COPD STABLE   Tobacco abuse Advised to stop smoking. Nicotine patch.  BPH continue Flomax  Severe hypokalemia -resolving with replacement.   Insomnia he takes Lunesta at home he has been getting Ambien in the hospital.  Estimated body mass index is 27.28 kg/m as calculated from the following:   Height as of 10/17/20: 5\' 7"  (1.702 m).   Weight as of this encounter: 79 kg.  DVT prophylaxis: Lovenox  code Status: Full code  family Communication: Discussed with wife at bedside disposition Plan:  Status is: Inpatient  Remains inpatient appropriate because:Ongoing active pain requiring inpatient pain management and Inpatient level of care appropriate due to severity of illness  Dispo: The patient is from: Home              Anticipated d/c is to: SNF              Patient currently is not medically stable to d/c.   Difficult  to place patient No  Consultants: NEURO   Procedures: NONE Antimicrobials: NONE   Subjective: Patient sitting up by the side of the bed very anxious and wanting to go home Discussed with wife by the bedside He ambulated in the hallway with PT yesterday  Objective: Vitals:   02/23/21 0344 02/23/21 0345 02/23/21 0750 02/23/21 0832  BP:  (!) 151/89 (!) 141/78   Pulse:  75 (!) 52   Resp:  20 20   Temp:  97.7 F (36.5 C) (!) 97.4 F (36.3 C)   TempSrc:  Oral Oral   SpO2:  95% 99% 98%  Weight: 79 kg       Intake/Output Summary (Last 24 hours) at 02/23/2021 1031 Last data filed at 02/23/2021 0400 Gross per 24 hour  Intake 240 ml   Output --  Net 240 ml    Filed Weights   02/21/21 0450 02/22/21 0043 02/23/21 0344  Weight: 77.8 kg 80 kg 79 kg    Examination:  General exam: Appears calm and comfortable  Respiratory system: Clear to auscultation. Respiratory effort normal. Cardiovascular system: S1 & S2 heard, RRR. No JVD, murmurs, rubs, gallops or clicks. No pedal edema. Gastrointestinal system: Abdomen is nondistended, soft and nontender. No organomegaly or masses felt. Normal bowel sounds heard. Central nervous system: Alert and oriented.  Right lower extremity 4 x 5 with decreased sensation  extremities: No edema. Skin: No rashes, lesions or ulcers Psychiatry: Judgement and insight appear normal. Mood & affect appropriate.     Data Reviewed: I have personally reviewed following labs and imaging studies  CBC: Recent Labs  Lab 02/18/21 1221 02/18/21 1232 02/19/21 1843  WBC 6.8  --  7.6  NEUTROABS 4.6  --   --   HGB 13.8 13.9 14.0  HCT 41.2 41.0 40.9  MCV 90.7  --  89.3  PLT 245  --  308    Basic Metabolic Panel: Recent Labs  Lab 02/18/21 1221 02/18/21 1232 02/19/21 1843 02/21/21 0218 02/21/21 0219 02/21/21 1532 02/22/21 0317  NA 138 143  --   --  142 138 140  K 3.0* 3.1*  --   --  2.4* 3.5 3.2*  CL 108 107  --   --  107 109 110  CO2 21*  --   --   --  23 24 23   GLUCOSE 117* 113*  --   --  103* 150* 108*  BUN 11 10  --   --  8 8 7*  CREATININE 0.88 0.80 0.85  --  0.93 1.03 0.94  CALCIUM 9.1  --   --   --  8.4* 8.7* 8.5*  MG  --   --  1.4* 1.4*  --  2.0  --     GFR: Estimated Creatinine Clearance: 61.5 mL/min (by C-G formula based on SCr of 0.94 mg/dL). Liver Function Tests: Recent Labs  Lab 02/18/21 1221  AST 13*  ALT 13  ALKPHOS 58  BILITOT 1.0  PROT 6.9  ALBUMIN 3.7    No results for input(s): LIPASE, AMYLASE in the last 168 hours. No results for input(s): AMMONIA in the last 168 hours. Coagulation Profile: Recent Labs  Lab 02/18/21 1221  INR 1.2    Cardiac  Enzymes: No results for input(s): CKTOTAL, CKMB, CKMBINDEX, TROPONINI in the last 168 hours. BNP (last 3 results) No results for input(s): PROBNP in the last 8760 hours. HbA1C: No results for input(s): HGBA1C in the last 72 hours.  CBG: Recent Labs  Lab  02/22/21 8657 02/22/21 1206 02/22/21 1725 02/22/21 2125 02/23/21 0616  GLUCAP 113* 204* 123* 139* 126*    Lipid Profile: No results for input(s): CHOL, HDL, LDLCALC, TRIG, CHOLHDL, LDLDIRECT in the last 72 hours.  Thyroid Function Tests: No results for input(s): TSH, T4TOTAL, FREET4, T3FREE, THYROIDAB in the last 72 hours. Anemia Panel: No results for input(s): VITAMINB12, FOLATE, FERRITIN, TIBC, IRON, RETICCTPCT in the last 72 hours. Sepsis Labs: No results for input(s): PROCALCITON, LATICACIDVEN in the last 168 hours.  Recent Results (from the past 240 hour(s))  Resp Panel by RT-PCR (Flu A&B, Covid) Nasopharyngeal Swab     Status: None   Collection Time: 02/19/21  3:03 AM   Specimen: Nasopharyngeal Swab; Nasopharyngeal(NP) swabs in vial transport medium  Result Value Ref Range Status   SARS Coronavirus 2 by RT PCR NEGATIVE NEGATIVE Final    Comment: (NOTE) SARS-CoV-2 target nucleic acids are NOT DETECTED.  The SARS-CoV-2 RNA is generally detectable in upper respiratory specimens during the acute phase of infection. The lowest concentration of SARS-CoV-2 viral copies this assay can detect is 138 copies/mL. A negative result does not preclude SARS-Cov-2 infection and should not be used as the sole basis for treatment or other patient management decisions. A negative result may occur with  improper specimen collection/handling, submission of specimen other than nasopharyngeal swab, presence of viral mutation(s) within the areas targeted by this assay, and inadequate number of viral copies(<138 copies/mL). A negative result must be combined with clinical observations, patient history, and epidemiological information. The  expected result is Negative.  Fact Sheet for Patients:  EntrepreneurPulse.com.au  Fact Sheet for Healthcare Providers:  IncredibleEmployment.be  This test is no t yet approved or cleared by the [place] FDA and  has been authorized for detection and/or diagnosis of SARS-CoV-2 by FDA under an Emergency Use Authorization (EUA). This EUA will remain  in effect (meaning this test can be used) for the duration of the COVID-19 declaration under Section 564(b)(1) of the Act, 21 U.S.C.section 360bbb-3(b)(1), unless the authorization is terminated  or revoked sooner.       Influenza A by PCR NEGATIVE NEGATIVE Final   Influenza B by PCR NEGATIVE NEGATIVE Final    Comment: (NOTE) The Xpert Xpress SARS-CoV-2/FLU/RSV plus assay is intended as an aid in the diagnosis of influenza from Nasopharyngeal swab specimens and should not be used as a sole basis for treatment. Nasal washings and aspirates are unacceptable for Xpert Xpress SARS-CoV-2/FLU/RSV testing.  Fact Sheet for Patients: EntrepreneurPulse.com.au  Fact Sheet for Healthcare Providers: IncredibleEmployment.be  This test is not yet approved or cleared by the [place] FDA and has been authorized for detection and/or diagnosis of SARS-CoV-2 by FDA under an Emergency Use Authorization (EUA). This EUA will remain in effect (meaning this test can be used) for the duration of the COVID-19 declaration under Section 564(b)(1) of the Act, 21 U.S.C. section 360bbb-3(b)(1), unless the authorization is terminated or revoked.  Performed at [facility], [place] [address] [place],  [address]           Radiology Studies: No results found.      Scheduled Meds:  amLODipine  10 mg Oral Daily   aspirin EC  81 mg Oral Daily   atorvastatin  40 mg Oral Daily   clopidogrel  75 mg Oral Daily   enoxaparin (LOVENOX) injection  40 mg Subcutaneous  Q24H   finasteride  5 mg Oral QHS   fluticasone furoate-vilanterol  1 puff Inhalation Daily  insulin aspart  0-5 Units Subcutaneous QHS   insulin aspart  0-9 Units Subcutaneous TID WC   LORazepam  0.5 mg Intravenous Once   losartan  100 mg Oral Daily   nicotine  21 mg Transdermal Daily   pantoprazole  40 mg Oral Daily   tamsulosin  0.4 mg Oral Daily   topiramate  75 mg Oral QHS   umeclidinium bromide  1 puff Inhalation Daily   zolpidem  5 mg Oral QHS   Continuous Infusions:     LOS: 3 days    Time spent: 76 MIN    Georgette Shell, MD  02/23/2021, 10:31 AM

## 2021-02-24 DIAGNOSIS — I639 Cerebral infarction, unspecified: Secondary | ICD-10-CM | POA: Diagnosis not present

## 2021-02-24 LAB — BASIC METABOLIC PANEL
Anion gap: 8 (ref 5–15)
BUN: 12 mg/dL (ref 8–23)
CO2: 23 mmol/L (ref 22–32)
Calcium: 8.6 mg/dL — ABNORMAL LOW (ref 8.9–10.3)
Chloride: 108 mmol/L (ref 98–111)
Creatinine, Ser: 1.04 mg/dL (ref 0.61–1.24)
GFR, Estimated: >60 mL/min (ref >60)
Glucose, Bld: 110 mg/dL — ABNORMAL HIGH (ref 70–99)
Potassium: 3.2 mmol/L — ABNORMAL LOW (ref 3.5–5.1)
Sodium: 139 mmol/L (ref 135–145)

## 2021-02-24 LAB — GLUCOSE, CAPILLARY
Glucose-Capillary: 129 mg/dL — ABNORMAL HIGH (ref 70–99)
Glucose-Capillary: 122 mg/dL — ABNORMAL HIGH (ref 70–99)

## 2021-02-24 MED ORDER — CLOPIDOGREL BISULFATE 75 MG PO TABS: 75.0000 mg | ORAL_TABLET | Freq: Every day | ORAL | 0 refills | Status: DC

## 2021-02-24 MED ORDER — POTASSIUM CHLORIDE CRYS ER 20 MEQ PO TBCR
40.0000 meq | EXTENDED_RELEASE_TABLET | Freq: Once | ORAL | Status: AC
  Administered 2021-02-24: 40 meq via ORAL
  Filled 2021-02-24: qty 2

## 2021-02-24 MED ORDER — POTASSIUM CHLORIDE CRYS ER 10 MEQ PO TBCR: 10.0000 meq | EXTENDED_RELEASE_TABLET | Freq: Every day | ORAL | 0 refills | Status: DC

## 2021-02-24 MED ORDER — HYDROXYZINE HCL 10 MG PO TABS
10.0000 mg | ORAL_TABLET | Freq: Once | ORAL | Status: AC
  Administered 2021-02-24: 10 mg via ORAL
  Filled 2021-02-24: qty 1

## 2021-02-24 MED ORDER — ASPIRIN 81 MG PO TBEC: 81.0000 mg | DELAYED_RELEASE_TABLET | Freq: Every day | ORAL | 11 refills | Status: DC

## 2021-02-24 NOTE — Plan of Care (Signed)
Adequate for discharge Problem: Education: Goal: Knowledge of General Education information will improve Description: Including pain rating scale, medication(s)/side effects and non-pharmacologic comfort measures 02/24/2021 1421 by Delia Chimes, RN Outcome: Adequate for Discharge 02/24/2021 1421 by Delia Chimes, RN Outcome: Progressing   Problem: Health Behavior/Discharge Planning: Goal: Ability to manage health-related needs will improve 02/24/2021 1421 by Delia Chimes, RN Outcome: Adequate for Discharge 02/24/2021 1421 by Delia Chimes, RN Outcome: Progressing   Problem: Clinical Measurements: Goal: Ability to maintain clinical measurements within normal limits will improve 02/24/2021 1421 by Delia Chimes, RN Outcome: Adequate for Discharge 02/24/2021 1421 by Delia Chimes, RN Outcome: Progressing Goal: Will remain free from infection 02/24/2021 1421 by Delia Chimes, RN Outcome: Adequate for Discharge 02/24/2021 1421 by Delia Chimes, RN Outcome: Progressing Goal: Diagnostic test results will improve 02/24/2021 1421 by Delia Chimes, RN Outcome: Adequate for Discharge 02/24/2021 1421 by Delia Chimes, RN Outcome: Progressing Goal: Respiratory complications will improve 02/24/2021 1421 by Delia Chimes, RN Outcome: Adequate for Discharge 02/24/2021 1421 by Delia Chimes, RN Outcome: Progressing Goal: Cardiovascular complication will be avoided 02/24/2021 1421 by Delia Chimes, RN Outcome: Adequate for Discharge 02/24/2021 1421 by Delia Chimes, RN Outcome: Progressing   Problem: Clinical Measurements: Goal: Cardiovascular complication will be avoided 02/24/2021 1421 by Delia Chimes, RN Outcome: Adequate for Discharge 02/24/2021 1421 by Delia Chimes, RN Outcome: Progressing   Problem: Activity: Goal: Risk for activity intolerance will decrease 02/24/2021 1421 by Delia Chimes, RN Outcome: Adequate for  Discharge 02/24/2021 1421 by Delia Chimes, RN Outcome: Progressing   Problem: Elimination: Goal: Will not experience complications related to bowel motility 02/24/2021 1421 by Delia Chimes, RN Outcome: Adequate for Discharge 02/24/2021 1421 by Delia Chimes, RN Outcome: Progressing Goal: Will not experience complications related to urinary retention 02/24/2021 1421 by Delia Chimes, RN Outcome: Adequate for Discharge 02/24/2021 1421 by Delia Chimes, RN Outcome: Progressing   Problem: Safety: Goal: Ability to remain free from injury will improve 02/24/2021 1421 by Delia Chimes, RN Outcome: Adequate for Discharge 02/24/2021 1421 by Delia Chimes, RN Outcome: Progressing   Problem: Skin Integrity: Goal: Risk for impaired skin integrity will decrease 02/24/2021 1421 by Delia Chimes, RN Outcome: Adequate for Discharge 02/24/2021 1421 by Delia Chimes, RN Outcome: Progressing

## 2021-02-24 NOTE — Progress Notes (Signed)
Inpatient Rehab Admissions Coordinator:   Spoke to PT who reports pt mobilizing well and discharge recommendations updated to home with [facility].  Pt and family in agreement. CIR will sign off at this time.   Shann Medal, PT, DPT Admissions Coordinator [phone] 02/24/21  1:11 PM

## 2021-02-24 NOTE — Progress Notes (Signed)
Physical Therapy Treatment Patient Details Name: Wayne [name] MRN: [id number] DOB: [birth year] Today's Date: 02/24/2021   History of Present Illness 75 year old male admitted due to R LE weakness and numbness found to have L pontine infarct. Patient with chronic R UE elbow contracture. PMH: COPD, DM, HTN, erectile dysfunction, Meniere's disease, HLD, thoracic aortic aneurysm and tobacco abuse.    PT Comments    Pt progressing well towards physical therapy goals. R PFRW adjusted for better fit, and pt reports improved comfort with mobility. Pt completed stair training with wife present and he was able to negotiate 5 stairs x2 with close supervision for safety. Pt mildly dyspneic at end of gait training but reports this is baseline, and has an appt scheduled with the "lung doctor" on 10/18. Discussed with pt, wife, and daughter, current level of function and they all agree that pt will have support necessary for safe d/c home. I think this is reasonable, as pt did not require any assistance from therapist throughout session. CM updated after session. Will continue to follow.     Recommendations for follow up therapy are one component of a multi-disciplinary discharge planning process, led by the attending physician.  Recommendations may be updated based on patient status, additional functional criteria and insurance authorization.  Follow Up Recommendations  Home with HHPT     Equipment Recommendations  Rolling walker with 5" wheels;Other (comment) (R platform attachment for RW)    Recommendations for Other Services       Precautions / Restrictions Precautions Precautions: Fall Precaution Comments: RUE contracture Required Braces or Orthoses:  (R platform attachment for RW) Restrictions Weight Bearing Restrictions: No Other Position/Activity Restrictions: R elbow fixed at 90 degrees flexion - chronic     Mobility  Bed Mobility               General bed mobility  comments: In recliner upon entry    Transfers Overall transfer level: Needs assistance Equipment used: Rolling walker (2 wheeled) Transfers: Sit to/from Stand Sit to Stand: Supervision Stand pivot transfers: Min guard       General transfer comment: No assist required. VC's for safety.  Ambulation/Gait Ambulation/Gait assistance: Supervision Gait Distance (Feet): 250 Feet Assistive device: Right platform walker Gait Pattern/deviations: Decreased stride length;Drifts right/left;Decreased dorsiflexion - right;Decreased dorsiflexion - left;Step-through pattern;Decreased step length - right Gait velocity: Decreased Gait velocity interpretation: 1.31 - 2.62 ft/sec, indicative of limited community ambulator General Gait Details: Progressing to supervision for safety by end of session. When utilizing the [place] appropriately, Pt with steady cadence and no overt LOB noted.   Stairs Stairs: Yes Stairs assistance: Min guard Stair Management: Alternating pattern;Two rails;Forwards Number of Stairs: 5 (x2) General stair comments: Pt demonstrated good negotiation of stairs without assist or LOB.   Wheelchair Mobility    Modified Rankin (Stroke Patients Only) Modified Rankin (Stroke Patients Only) Pre-Morbid Rankin Score: No significant disability Modified Rankin: Moderately severe disability     Balance Overall balance assessment: Needs assistance Sitting-balance support: No upper extremity supported Sitting balance-Leahy Scale: Good     Standing balance support: No upper extremity supported;During functional activity Standing balance-Leahy Scale: Fair Standing balance comment: statically                            Cognition Arousal/Alertness: Awake/alert Behavior During Therapy: WFL for tasks assessed/performed;Impulsive Overall Cognitive Status: Within Functional Limits for tasks assessed Area of Impairment: Problem solving;Safety/judgement;Memory  Memory: Decreased short-term memory   Safety/Judgement: Decreased awareness of safety;Decreased awareness of deficits   Problem Solving: Slow processing General Comments: required frequent cues for safety with transfers      Exercises      General Comments        Pertinent Vitals/Pain Pain Assessment: No/denies pain    Home Living                      Prior Function            PT Goals (current goals can now be found in the care plan section) Acute Rehab PT Goals Patient Stated Goal: Home today PT Goal Formulation: With patient/family Time For Goal Achievement: 03/05/21 Potential to Achieve Goals: Good Progress towards PT goals: Progressing toward goals    Frequency    Min 4X/week      PT Plan Discharge plan needs to be updated    Co-evaluation              AM-PAC PT "6 Clicks" Mobility   Outcome Measure  Help needed turning from your back to your side while in a flat bed without using bedrails?: None Help needed moving from lying on your back to sitting on the side of a flat bed without using bedrails?: None Help needed moving to and from a bed to a chair (including a wheelchair)?: A Little Help needed standing up from a chair using your arms (e.g., wheelchair or bedside chair)?: A Little Help needed to walk in hospital room?: A Little Help needed climbing 3-5 steps with a railing? : A Little 6 Click Score: 20    End of Session Equipment Utilized During Treatment: Gait belt Activity Tolerance: Patient tolerated treatment well Patient left: in chair;with call bell/phone within reach;with family/visitor present Nurse Communication: Mobility status PT Visit Diagnosis: Other abnormalities of gait and mobility (R26.89);Other symptoms and signs involving the nervous system (R29.898);Muscle weakness (generalized) (M62.81);Hemiplegia and hemiparesis Hemiplegia - Right/Left: Right Hemiplegia - dominant/non-dominant:  Non-dominant Hemiplegia - caused by: Cerebral infarction     Time: 5409-8119 PT Time Calculation (min) (ACUTE ONLY): 27 min  Charges:  $Gait Training: 23-37 mins                     Rolinda Roan, PT, DPT Acute Rehabilitation Services Pager: [phone] Office: [phone]    Thelma Comp 02/24/2021, 1:15 PM

## 2021-02-24 NOTE — Discharge Summary (Signed)
Physician Discharge Summary  Wayne [name] ZHG:[id number] DOB: Apr 27, 1946 DOA: 02/18/2021  PCP: Cipriano Mile, NP  Admit date: 02/18/2021 Discharge date: 02/24/2021  Admitted From: Home Disposition: Home   Recommendations for Outpatient Follow-up:  Follow up with PCP in 1-2 weeks Please obtain BMP/CBC in one week Please follow up with neurology Home Health: Yes Equipment/Devices: None Discharge Condition stable CODE STATUS full code Diet recommendation: Cardiac  brief/Interim Summary:75 year old white male with a history of COPD, hypertension, type 2 diabetes, hyperlipidemia, tobacco abuse presents the ER today at the behest of his family doctor.  Patient states that on Monday afternoon February 17, 2021, about 12 PM, patient was outside in his yard doing some yard work.  He had a sudden loss of strength in his right leg.  He also felt numb in his right leg.  He thought the symptoms would go away.  Patient carried on for the rest of his day.  Did not have any slurred speech.  Not have any falls.  He let his wife know and she called the doctor the next day on Tuesday.  When the doctor called him back on Wednesday, the patient still having symptoms.  PCP recommended patient go to the hospital ER for evaluation.   On arrival to ER temp was 98.1 heart rate 87 blood pressure 139/77 sats 98%.   Lab evaluation showed serum potassium 3.0.  Glucose 117.  BUN of 11.  Creatinine 0.88.  Hemoglobin of 13.8.   MRI of the brain with and without contrast demonstrated 1.2 cm focus of diffusion abnormality involving the left pons consistent with acute ischemic infarct.  No hemorrhage or mass-effect.   EKG demonstrated sinus bradycardia.  There is nonspecific ST-T wave changes.   Due to the acute ischemic stroke noted on MRI, [facility] hospitalist contacted for admission.  Patient out of tPA window due to his symptoms starting on February 17, 2021.    ED Course: labwork unremarkable except for hypokalemia.  MRI brain showed pontine stroke.  Discharge Diagnoses:  Principal Problem:   Left pontine CVA ([place]) Active Problems:   Essential hypertension   Mixed hyperlipidemia   Diabetes mellitus without complication ([place])   Other emphysema ([facility])   Tobacco abuse   Left pontine stroke ([place])   TIA (transient ischemic attack)   #1 left pontine CVA with right sided weakness MRI of the brain with left pontine stroke.  CT of the head and neck revealed no large vessel occlusion.  Echo shows EF of 60%.  His lipid panel was LDL 58.  Hemoglobin A1c was 5.8.  Creatinine 0.80.  He was started on aspirin and Plavix for 3 weeks and then aspirin alone.  He was seen by PT OT and speech therapy.  He will be discharged home with PT at home.  #2 essential hypertension continue Norvasc and Cozaar as he was taking at home.  #3 type 2 diabetes continue home meds  #3 mixed hyperlipidemia continue Lipitor 40 mg daily as he was taking at home  #4 tobacco abuse and COPD encourage patient to quit smoking  #5 BPH continue Flomax  #6 hypokalemia his potassium was as low as 2.4 which was replaced.  Potassium on the day of discharge was 3.2.  He tends to run a low potassium he is not on any diuretics.  Encourage a high potassium diet.  I have given him prescriptions for K. Dur 10 mEq daily and to have BMP checked as an outpatient in 1 week.  #7  insomnia continue Lunesta                  Estimated body mass index is 27.28 kg/m as calculated from the following:   Height as of 10/17/20: $RemoveBe'5\' 7"'ZGdPhjBio$  (1.702 m).   Weight as of this encounter: 79 kg.  Discharge Instructions  Discharge Instructions     Ambulatory referral to Neurology   Complete by: As directed    Follow up with Dr. Felecia Shelling at [facility] in 4-6 weeks.  Patient is Dr. Armando Reichert patient in the past.  Thanks.   Diet - low sodium heart healthy   Complete by: As directed    Increase activity slowly   Complete by: As directed       Allergies as of 02/24/2021        Reactions   Gabapentin Other (See Comments)   Adverse reaction   Sertraline Other (See Comments)   Adverse reaction   Metformin Diarrhea, Nausea Only   Pineapple Hives   Pravastatin Other (See Comments)   Lorazepam Other (See Comments)   Shakiness        Medication List     STOP taking these medications    BC HEADACHE POWDER PO   buPROPion 150 MG 12 hr tablet Commonly known as: Wellbutrin [name]   cephALEXin 500 MG capsule Commonly known as: KEFLEX   DULoxetine 60 MG capsule Commonly known as: CYMBALTA   losartan 100 MG tablet Commonly known as: COZAAR   metoprolol succinate 25 MG 24 hr tablet Commonly known as: TOPROL-XL   sitaGLIPtin 100 MG tablet Commonly known as: Januvia   tiZANidine 4 MG tablet Commonly known as: ZANAFLEX   traZODone 50 MG tablet Commonly known as: DESYREL       TAKE these medications    acetaminophen 500 MG tablet Commonly known as: TYLENOL Take 1 tablet (500 mg total) by mouth every 6 (six) hours as needed.   albuterol (2.5 MG/3ML) 0.083% nebulizer solution Commonly known as: PROVENTIL Inhale 2.5 mg into the lungs every 6 (six) hours as needed for shortness of breath or wheezing.   albuterol 108 (90 Base) MCG/ACT inhaler Commonly known as: VENTOLIN HFA Inhale 1-2 puffs into the lungs every 6 (six) hours as needed for shortness of breath or wheezing.   amLODipine 10 MG tablet Commonly known as: NORVASC Take 10 mg by mouth daily. What changed: Another medication with the same name was removed. Continue taking this medication, and follow the directions you see here.   aspirin 81 MG EC tablet Take 1 tablet (81 mg total) by mouth daily. Swallow whole. Start taking on: February 25, 2021   atorvastatin 40 MG tablet Commonly known as: LIPITOR TAKE 1 TABLET BY MOUTH  DAILY   Breztri Aerosphere 160-9-4.8 MCG/ACT Aero Generic drug: Budeson-Glycopyrrol-Formoterol Inhale 1 puff into the lungs in the morning and at bedtime.    clopidogrel 75 MG tablet Commonly known as: PLAVIX Take 1 tablet (75 mg total) by mouth daily. Start taking on: February 25, 2021   eszopiclone 1 MG Tabs tablet Commonly known as: LUNESTA Take 1 mg by mouth at bedtime.   finasteride 5 MG tablet Commonly known as: PROSCAR Take 1 tablet (5 mg total) by mouth at bedtime.   fluticasone 50 MCG/ACT nasal spray Commonly known as: FLONASE USE 2 SPRAY(S) IN EACH NOSTRIL ONCE DAILY AS NEEDED FOR ALLERGIES OR RHIITIS What changed: See the new instructions.   glucose blood test strip Use new test strip each time when checking blood sugar  Melatonin 10 MG Tabs Take 10 mg by mouth at bedtime as needed (sleep).   metFORMIN 500 MG tablet Commonly known as: GLUCOPHAGE Take 500 mg by mouth daily.   omeprazole 40 MG capsule Commonly known as: PRILOSEC TAKE 1 CAPSULE BY MOUTH  DAILY   ONE TOUCH ULTRA 2 w/Device Kit Use daily to check blood sugar K56.2   OneTouch Delica Lancets 56L Misc Use a new lancet each time when checking blood sugar   sildenafil 100 MG tablet Commonly known as: VIAGRA TAKE 1 TABLET BY MOUTH AS NEEDED APPROXIMATELY 1 HOUR PRIOR TO SEXUAL ACTIVITY What changed:  how much to take how to take this when to take this reasons to take this additional instructions   tamsulosin 0.4 MG Caps capsule Commonly known as: FLOMAX TAKE 1 CAPSULE BY MOUTH  DAILY   topiramate 25 MG tablet Commonly known as: TOPAMAX Take 75 mg by mouth at bedtime.   Vitamin B-12 2500 MCG Subl Place under the tongue. What changed: Another medication with the same name was removed. Continue taking this medication, and follow the directions you see here.               Durable Medical Equipment  (From admission, onward)           Start     Ordered   02/24/21 1313  For home use only DME 3 n 1  Once        02/24/21 1313   02/24/21 1202  For home use only DME Walker rolling  Once       Question Answer Comment  Walker: With [place] Wheels   Patient needs a walker to treat with the following condition Stroke ([place])      02/24/21 1201   02/24/21 1201  For home use only DME Walker platform  Once       Comments: Rt sided platform  Question:  Patient needs a walker to treat with the following condition  Answer:  Stroke ([place])   02/24/21 1200            Follow-up Information     Sater, Nanine Means, MD. Schedule an appointment as soon as possible for a visit in 1 month(s).   Specialty: Neurology Contact information: [place] [address] [phone]         Cipriano Mile, NP Follow up.   Contact information: [place] [address] [phone]         [facility] Follow up.   Specialty: Home Health Services Why: [facility]--They will call you for the first home visit. Contact information: [place] [address] [phone]                Allergies  Allergen Reactions   Gabapentin Other (See Comments)    Adverse reaction   Sertraline Other (See Comments)    Adverse reaction   Metformin Diarrhea and Nausea Only   Pineapple Hives   Pravastatin Other (See Comments)   Lorazepam Other (See Comments)    Shakiness    Consultations: neuro   Procedures/Studies: CT ANGIO HEAD NECK W WO CM  Result Date: 02/19/2021 CLINICAL DATA:  Stroke follow-up EXAM: CT ANGIOGRAPHY HEAD AND NECK TECHNIQUE: Multidetector CT imaging of the head and neck was performed using the standard protocol during bolus administration of intravenous contrast. Multiplanar CT image reconstructions and MIPs were obtained to evaluate the vascular anatomy. Carotid stenosis measurements (when applicable) are obtained  utilizing NASCET criteria, using the distal internal carotid diameter as the denominator. CONTRAST:  73mL OMNIPAQUE IOHEXOL 350 MG/ML SOLN COMPARISON:  Brain MRI pained approximately 12 hours prior. FINDINGS: CT HEAD FINDINGS Brain: There is  hypodensity in the left pons corresponding to the infarct seen on the prior brain MRI. There is no evidence of hemorrhage. There is no evidence of new territorial infarct. There is no acute intracranial hemorrhage or extra-axial fluid collection. There is moderate global parenchymal volume loss. Hypodensity in the subcortical and periventricular white matter likely reflects sequela of moderate chronic white matter microangiopathy. The left para falcine meningioma is again seen. There is no other solid mass lesion. There is no midline shift. Vascular: See below. Skull: Normal. Negative for fracture or focal lesion. Sinuses and orbits: The paranasal sinuses are clear. Bilateral lens implants are in place. The globes and orbits are otherwise unremarkable. Review of the MIP images confirms the above findings CTA NECK FINDINGS Aortic arch: There is calcified atherosclerotic plaque of the aortic arch and the origins of the great vessels without hemodynamically significant stenosis. There is a common origin of the right brachiocephalic and left common carotid arteries, a normal variant. Right carotid system: There is mild soft and calcified atherosclerotic plaque in the proximal right internal carotid artery without hemodynamically limiting stenosis or occlusion. There is no dissection or aneurysm. The right internal carotid artery has a tortuous course in the neck. Left carotid system: There is soft and calcified atherosclerotic plaque in the proximal left internal carotid artery without hemodynamically significant stenosis by NASCET criteria. There is no dissection or aneurysm. Vertebral arteries: The vertebral arteries are patent, with no significant stenosis, occlusion, dissection, or aneurysm. Skeleton: There is mild multilevel degenerative change of the cervical spine. There is no acute osseous abnormality or aggressive osseous lesion. Other neck: The soft tissues are unremarkable. Upper chest: There is severe  emphysema in the lung apices. Review of the MIP images confirms the above findings CTA HEAD FINDINGS Anterior circulation: There is mild calcified atherosclerotic plaque in the bilateral intracranial carotid arteries without significant stenosis or occlusion. The bilateral MCAs and ACAs are patent. There is no aneurysm. Posterior circulation: The bilateral V4 segments are patent. PICA is seen bilaterally. The basilar artery is patent. The bilateral PCAs are patent. Venous sinuses: Patent. Anatomic variants: The right posterior communicating artery is not definitively identified. Review of the MIP images confirms the above findings IMPRESSION: 1. Hypodensity in the left pons consistent with evolving infarct as seen on the prior brain MRI. No evidence of hemorrhage. 2. Patent intracranial vasculature with minimal atherosclerotic disease. Specifically, the basilar artery is widely patent. 3. Mild soft and calcified atherosclerotic plaque in the bilateral carotid bulbs without hemodynamically significant stenosis. Patent vertebral arteries. 4. Tortuosity of the right internal carotid artery may be due to hypertension. 5. Unchanged global parenchymal volume loss and chronic white matter microangiopathy. 6. Severe emphysema in the lung apices. Aortic Atherosclerosis (ICD10-I70.0) and Emphysema (ICD10-J43.9). Electronically Signed   By: Valetta Mole M.D.   On: 02/19/2021 10:31   MR Brain W and Wo Contrast  Result Date: 02/19/2021 CLINICAL DATA:  Initial evaluation for neuro deficit, stroke suspected. EXAM: MRI HEAD WITHOUT AND WITH CONTRAST TECHNIQUE: Multiplanar, multiecho pulse sequences of the brain and surrounding structures were obtained without and with intravenous contrast. CONTRAST:  43mL GADAVIST GADOBUTROL 1 MMOL/ML IV SOLN COMPARISON:  None available. FINDINGS: Brain: Examination moderately to severely degraded by motion artifact. Diffuse prominence of the CSF containing spaces  compatible with generalized  cerebral atrophy. Patchy and confluent T2/FLAIR hyperintensity involving the periventricular and deep white matter of both cerebral hemispheres most consistent with chronic small vessel ischemic disease, moderately advanced in nature. Few scattered remote lacunar infarcts present about the basal ganglia/corona radiata. Small remote left cerebellar infarct. 1.2 cm focus of diffusion abnormality seen involving the left pons, consistent with an acute ischemic infarct (series 5, image 71). No associated hemorrhage or mass effect. No other visible foci of restricted diffusion to suggest acute or subacute ischemia. Gray-white matter differentiation otherwise maintained. No other areas of remote cortical infarction. No acute intracranial hemorrhage. Single chronic microhemorrhage noted within the subcortical white matter left frontal lobe, of doubtful significance in isolation. 1.4 cm enhancing lesion seen along the posterior falx immediately posterior to the splenium, likely a small meningioma (series 28, image 33). No significant associated mass effect. No other visible mass lesion, mass effect or midline shift. No hydrocephalus or extra-axial fluid collection. Pituitary gland and suprasellar region grossly within normal limits. Midline structures intact. Vascular: Major intracranial vascular flow voids are grossly maintained on this motion degraded exam. Skull and upper cervical spine: Craniocervical junction grossly within normal limits. Bone marrow signal intensity normal. No scalp soft tissue abnormality. Sinuses/Orbits: Prior bilateral ocular lens replacement. Globes and orbital soft tissues demonstrate no acute abnormality. Paranasal sinuses are largely clear. No significant mastoid effusion. Other: None. IMPRESSION: 1. Motion degraded exam. 2. 1.2 cm acute ischemic nonhemorrhagic left pontine infarct. 3. Age-related cerebral atrophy with moderately advanced chronic microvascular ischemic disease. 4. 1.4 cm  probable meningioma along the posterior falx. No associated mass effect. Electronically Signed   By: Jeannine Boga M.D.   On: 02/19/2021 02:00   ECHOCARDIOGRAM COMPLETE  Result Date: 02/19/2021    ECHOCARDIOGRAM REPORT   Patient Name:   Wayne [name] Date of Exam: 02/19/2021 Medical Rec #:  [id number]           Height:       67.0 in Accession #:    [id number]          Weight:       189.0 lb Date of Birth:  1945-07-10           BSA:          1.974 m Patient Age:    50 years            BP:           146/93 mmHg Patient Gender: M                   HR:           71 bpm. Exam Location:  Inpatient Procedure: 2D Echo, Color Doppler and Cardiac Doppler Indications:    CVA  History:        Patient has no prior history of Echocardiogram examinations.                 CAD; Risk Factors:Hypertension, Dyslipidemia, emphysema and                 Current Smoker.  Sonographer:    Melissa Morford RDCS (AE, PE) Referring Phys: [place]  1. Left ventricular ejection fraction, by estimation, is 60 to 65%. The left ventricle has normal function. The left ventricle has no regional wall motion abnormalities. There is mild left ventricular hypertrophy. Left ventricular diastolic parameters are consistent with Grade I diastolic dysfunction (impaired relaxation).  2. Right ventricular systolic  function is normal. The right ventricular size is normal. Tricuspid regurgitation signal is inadequate for assessing PA pressure.  3. A small pericardial effusion is present. The pericardial effusion is anterior to the right ventricle. There is no evidence of cardiac tamponade.  4. The mitral valve is normal in structure. No evidence of mitral valve regurgitation.  5. The aortic valve is tricuspid. Aortic valve regurgitation is not visualized. Mild aortic valve sclerosis is present, with no evidence of aortic valve stenosis.  6. The inferior vena cava is normal in size with greater than 50% respiratory variability,  suggesting right atrial pressure of 3 mmHg. Comparison(s): No prior Echocardiogram. FINDINGS  Left Ventricle: Left ventricular ejection fraction, by estimation, is 60 to 65%. The left ventricle has normal function. The left ventricle has no regional wall motion abnormalities. The left ventricular internal cavity size was normal in size. There is  mild left ventricular hypertrophy. Left ventricular diastolic parameters are consistent with Grade I diastolic dysfunction (impaired relaxation). Indeterminate filling pressures. Right Ventricle: The right ventricular size is normal. No increase in right ventricular wall thickness. Right ventricular systolic function is normal. Tricuspid regurgitation signal is inadequate for assessing PA pressure. Left Atrium: Left atrial size was normal in size. Right Atrium: Right atrial size was normal in size. Pericardium: A small pericardial effusion is present. The pericardial effusion is anterior to the right ventricle. The pericardial effusion appears to contain mixed echogenic material. There is no evidence of cardiac tamponade. Mitral Valve: The mitral valve is normal in structure. No evidence of mitral valve regurgitation. Tricuspid Valve: The tricuspid valve is grossly normal. Tricuspid valve regurgitation is trivial. Aortic Valve: The aortic valve is tricuspid. Aortic valve regurgitation is not visualized. Mild aortic valve sclerosis is present, with no evidence of aortic valve stenosis. Pulmonic Valve: The pulmonic valve was normal in structure. Pulmonic valve regurgitation is not visualized. Aorta: The aortic root and ascending aorta are structurally normal, with no evidence of dilitation. Venous: The inferior vena cava is normal in size with greater than 50% respiratory variability, suggesting right atrial pressure of 3 mmHg. IAS/Shunts: No atrial level shunt detected by color flow Doppler.  LEFT VENTRICLE PLAX 2D LVIDd:         4.83 cm      Diastology LVIDs:         3.25  cm      LV e' medial:    4.03 cm/s LV PW:         0.97 cm      LV E/e' medial:  17.5 LV IVS:        1.03 cm      LV e' lateral:   5.33 cm/s LVOT diam:     2.20 cm      LV E/e' lateral: 13.2 LV SV:         70 LV SV Index:   36 LVOT Area:     3.80 cm  LV Volumes (MOD) LV vol d, MOD A2C: 101.0 ml LV vol d, MOD A4C: 113.0 ml LV vol s, MOD A2C: 52.6 ml LV vol s, MOD A4C: 46.7 ml LV SV MOD A2C:     48.4 ml LV SV MOD A4C:     113.0 ml LV SV MOD BP:      54.7 ml RIGHT VENTRICLE TAPSE (M-mode): 1.1 cm LEFT ATRIUM             Index       RIGHT ATRIUM  Index LA diam:        3.75 cm 1.90 cm/m  RA Area:     14.00 cm LA Vol (A2C):   38.1 ml 19.30 ml/m RA Volume:   36.60 ml  18.54 ml/m LA Vol (A4C):   52.6 ml 26.64 ml/m LA Biplane Vol: 45.5 ml 23.05 ml/m  AORTIC VALVE LVOT Vmax:   106.00 cm/s LVOT Vmean:  60.100 cm/s LVOT VTI:    0.185 m  AORTA Ao Root diam: 3.27 cm Ao Asc diam:  3.50 cm MITRAL VALVE MV Area (PHT): 3.60 cm     SHUNTS MV Decel Time: 211 msec     Systemic VTI:  0.18 m MV E velocity: 70.40 cm/s   Systemic Diam: 2.20 cm MV A velocity: 106.00 cm/s MV E/A ratio:  0.66 Lyman Bishop MD Electronically signed by Lyman Bishop MD Signature Date/Time: 02/19/2021/12:57:07 PM    Final    (Echo, Carotid, EGD, Colonoscopy, ERCP)    Subjective: Patient is sitting up by the side of the bed wife in the room very anxious wants to go home he does not want to stay another night  Discharge Exam: Vitals:   02/24/21 0845 02/24/21 1139  BP: (!) 144/90 137/81  Pulse: (!) 56 75  Resp: 20 20  Temp: 97.7 F (36.5 C) 97.6 F (36.4 C)  SpO2: 99% 99%   Vitals:   02/24/21 0025 02/24/21 0355 02/24/21 0845 02/24/21 1139  BP: (!) 150/79 (!) 154/83 (!) 144/90 137/81  Pulse: 63  (!) 56 75  Resp: $Remo'18 16 20 20  'GfvrR$ Temp: 98.2 F (36.8 C) 98 F (36.7 C) 97.7 F (36.5 C) 97.6 F (36.4 C)  TempSrc: Axillary Axillary Oral Oral  SpO2: 96% 97% 99% 99%  Weight:        General: Pt is alert, awake, not in acute  distress Cardiovascular: RRR, S1/S2 +, no rubs, no gallops Respiratory: CTA bilaterally, no wheezing, no rhonchi Abdominal: Soft, NT, ND, bowel sounds + Extremities: no edema, no cyanosis    The results of significant diagnostics from this hospitalization (including imaging, microbiology, ancillary and laboratory) are listed below for reference.     Microbiology: Recent Results (from the past 240 hour(s))  Resp Panel by RT-PCR (Flu A&B, Covid) Nasopharyngeal Swab     Status: None   Collection Time: 02/19/21  3:03 AM   Specimen: Nasopharyngeal Swab; Nasopharyngeal(NP) swabs in vial transport medium  Result Value Ref Range Status   SARS Coronavirus 2 by RT PCR NEGATIVE NEGATIVE Final    Comment: (NOTE) SARS-CoV-2 target nucleic acids are NOT DETECTED.  The SARS-CoV-2 RNA is generally detectable in upper respiratory specimens during the acute phase of infection. The lowest concentration of SARS-CoV-2 viral copies this assay can detect is 138 copies/mL. A negative result does not preclude SARS-Cov-2 infection and should not be used as the sole basis for treatment or other patient management decisions. A negative result may occur with  improper specimen collection/handling, submission of specimen other than nasopharyngeal swab, presence of viral mutation(s) within the areas targeted by this assay, and inadequate number of viral copies(<138 copies/mL). A negative result must be combined with clinical observations, patient history, and epidemiological information. The expected result is Negative.  Fact Sheet for Patients:  EntrepreneurPulse.com.au  Fact Sheet for Healthcare Providers:  IncredibleEmployment.be  This test is no t yet approved or cleared by the [place] FDA and  has been authorized for detection and/or diagnosis of SARS-CoV-2 by FDA under an Emergency Use Authorization (EUA).  This EUA will remain  in effect (meaning this test  can be used) for the duration of the COVID-19 declaration under Section 564(b)(1) of the Act, 21 U.S.C.section 360bbb-3(b)(1), unless the authorization is terminated  or revoked sooner.       Influenza A by PCR NEGATIVE NEGATIVE Final   Influenza B by PCR NEGATIVE NEGATIVE Final    Comment: (NOTE) The Xpert Xpress SARS-CoV-2/FLU/RSV plus assay is intended as an aid in the diagnosis of influenza from Nasopharyngeal swab specimens and should not be used as a sole basis for treatment. Nasal washings and aspirates are unacceptable for Xpert Xpress SARS-CoV-2/FLU/RSV testing.  Fact Sheet for Patients: EntrepreneurPulse.com.au  Fact Sheet for Healthcare Providers: IncredibleEmployment.be  This test is not yet approved or cleared by the [place] FDA and has been authorized for detection and/or diagnosis of SARS-CoV-2 by FDA under an Emergency Use Authorization (EUA). This EUA will remain in effect (meaning this test can be used) for the duration of the COVID-19 declaration under Section 564(b)(1) of the Act, 21 U.S.C. section 360bbb-3(b)(1), unless the authorization is terminated or revoked.  Performed at [facility], [place] [address], [place], [place] [address]      Labs: BNP (last 3 results) No results for input(s): BNP in the last 8760 hours. Basic Metabolic Panel: Recent Labs  Lab 02/19/21 1843 02/21/21 0218 02/21/21 0219 02/21/21 1532 02/22/21 0317 02/23/21 1110 02/24/21 0312  NA  --   --  142 138 140 138 139  K  --   --  2.4* 3.5 3.2* 3.4* 3.2*  CL  --   --  107 109 110 107 108  CO2  --   --  $R'23 24 23 23 23  'Ff$ GLUCOSE  --   --  103* 150* 108* 166* 110*  BUN  --   --  8 8 7* 8 12  CREATININE 0.85  --  0.93 1.03 0.94 0.94 1.04  CALCIUM  --   --  8.4* 8.7* 8.5* 8.9 8.6*  MG 1.4* 1.4*  --  2.0  --  1.7  --    Liver Function Tests: Recent Labs  Lab 02/18/21 1221  AST 13*  ALT 13  ALKPHOS 58  BILITOT 1.0  PROT 6.9   ALBUMIN 3.7   No results for input(s): LIPASE, AMYLASE in the last 168 hours. No results for input(s): AMMONIA in the last 168 hours. CBC: Recent Labs  Lab 02/18/21 1221 02/18/21 1232 02/19/21 1843  WBC 6.8  --  7.6  NEUTROABS 4.6  --   --   HGB 13.8 13.9 14.0  HCT 41.2 41.0 40.9  MCV 90.7  --  89.3  PLT 245  --  259   Cardiac Enzymes: No results for input(s): CKTOTAL, CKMB, CKMBINDEX, TROPONINI in the last 168 hours. BNP: Invalid input(s): POCBNP CBG: Recent Labs  Lab 02/23/21 1140 02/23/21 1710 02/23/21 2125 02/24/21 0602 02/24/21 1143  GLUCAP 160* 120* 104* 129* 122*   D-Dimer No results for input(s): DDIMER in the last 72 hours. Hgb A1c No results for input(s): HGBA1C in the last 72 hours. Lipid Profile No results for input(s): CHOL, HDL, LDLCALC, TRIG, CHOLHDL, LDLDIRECT in the last 72 hours. Thyroid function studies No results for input(s): TSH, T4TOTAL, T3FREE, THYROIDAB in the last 72 hours.  Invalid input(s): FREET3 Anemia work up No results for input(s): VITAMINB12, FOLATE, FERRITIN, TIBC, IRON, RETICCTPCT in the last 72 hours. Urinalysis    Component Value Date/Time   COLORURINE YELLOW 07/02/2006  5638   APPEARANCEUR Clear 07/02/2006 0807   LABSPEC > OR = 1.030 07/02/2006 0807   PHURINE 5.5 07/02/2006 0807   GLUCOSEU NEGATIVE 07/02/2006 0807   BILIRUBINUR negative 10/17/2020 1550   KETONESUR NEGATIVE 07/02/2006 0807   PROTEINUR Positive (A) 10/17/2020 1550   UROBILINOGEN 1.0 10/17/2020 1550   UROBILINOGEN 0.2 mg/dL 07/02/2006 0807   NITRITE negative 10/17/2020 1550   NITRITE Negative 07/02/2006 0807   LEUKOCYTESUR Large (3+) (A) 10/17/2020 1550   Sepsis Labs Invalid input(s): PROCALCITONIN,  WBC,  LACTICIDVEN Microbiology Recent Results (from the past 240 hour(s))  Resp Panel by RT-PCR (Flu A&B, Covid) Nasopharyngeal Swab     Status: None   Collection Time: 02/19/21  3:03 AM   Specimen: Nasopharyngeal Swab; Nasopharyngeal(NP) swabs in vial  transport medium  Result Value Ref Range Status   SARS Coronavirus 2 by RT PCR NEGATIVE NEGATIVE Final    Comment: (NOTE) SARS-CoV-2 target nucleic acids are NOT DETECTED.  The SARS-CoV-2 RNA is generally detectable in upper respiratory specimens during the acute phase of infection. The lowest concentration of SARS-CoV-2 viral copies this assay can detect is 138 copies/mL. A negative result does not preclude SARS-Cov-2 infection and should not be used as the sole basis for treatment or other patient management decisions. A negative result may occur with  improper specimen collection/handling, submission of specimen other than nasopharyngeal swab, presence of viral mutation(s) within the areas targeted by this assay, and inadequate number of viral copies(<138 copies/mL). A negative result must be combined with clinical observations, patient history, and epidemiological information. The expected result is Negative.  Fact Sheet for Patients:  EntrepreneurPulse.com.au  Fact Sheet for Healthcare Providers:  IncredibleEmployment.be  This test is no t yet approved or cleared by the [place] FDA and  has been authorized for detection and/or diagnosis of SARS-CoV-2 by FDA under an Emergency Use Authorization (EUA). This EUA will remain  in effect (meaning this test can be used) for the duration of the COVID-19 declaration under Section 564(b)(1) of the Act, 21 U.S.C.section 360bbb-3(b)(1), unless the authorization is terminated  or revoked sooner.       Influenza A by PCR NEGATIVE NEGATIVE Final   Influenza B by PCR NEGATIVE NEGATIVE Final    Comment: (NOTE) The Xpert Xpress SARS-CoV-2/FLU/RSV plus assay is intended as an aid in the diagnosis of influenza from Nasopharyngeal swab specimens and should not be used as a sole basis for treatment. Nasal washings and aspirates are unacceptable for Xpert Xpress SARS-CoV-2/FLU/RSV testing.  Fact  Sheet for Patients: EntrepreneurPulse.com.au  Fact Sheet for Healthcare Providers: IncredibleEmployment.be  This test is not yet approved or cleared by the [place] FDA and has been authorized for detection and/or diagnosis of SARS-CoV-2 by FDA under an Emergency Use Authorization (EUA). This EUA will remain in effect (meaning this test can be used) for the duration of the COVID-19 declaration under Section 564(b)(1) of the Act, 21 U.S.C. section 360bbb-3(b)(1), unless the authorization is terminated or revoked.  Performed at [facility], [place] [address] [place], [place] [address]      Time coordinating discharge: 38 minutes  SIGNED:   Georgette Shell, MD  [facility] 02/24/2021, 1:59 PM

## 2021-02-24 NOTE — TOC Transition Note (Signed)
Transition of Care [facility]) - CM/SW Discharge Note   Patient Details  Name: Wayne [name] MRN: [id number] Date of Birth: [birth year]  [facility] CM/SW Contact:  Pollie Friar, RN Phone Number: 02/24/2021, 1:51 PM   Clinical Narrative:    Patient is discharging home with Home health services through [employer]. Information on the AVS.  Platform walker and 3 in 1 delivered to the room per [employer].  Wife is able to provide support at home.  Wife providing transport home.   Final next level of care: [place] Barriers to Discharge: No Barriers Identified   Patient Goals and CMS Choice     Choice offered to / list presented to : Patient, Spouse  Discharge Placement                       Discharge Plan and Services                DME Arranged: Gilford Rile platform DME Agency: [employer] Date DME Agency Contacted: 02/24/21   Representative spoke with at DME Agency: Freda Munro HH Arranged: PT, OT [facility] Agency: [place] Date [place]: 02/24/21   Representative spoke with at [place]: Amy  Social Determinants of Health ([place]) Interventions     Readmission Risk Interventions No flowsheet data found.

## 2021-02-24 NOTE — Progress Notes (Signed)
Patient D/C home with DME equipment and D/C instructions

## 2021-02-24 NOTE — Progress Notes (Addendum)
Occupational Therapy Treatment Patient Details Name: Wayne [name] MRN: [id number] DOB: 11-02-1945 Today's Date: 02/24/2021   History of present illness 75 year old white male admitted due to R LE weakness and numbness found to have L pontine infarct. Patient with chronic R UE elbow contracture. PMH: COPD, DM, HTN, erectile dysfunction, Meniere's disease, HLD, thoracic aortic aneurysm and tobacco abuse.   OT comments  Patient received in recliner and agreeable to OT session. Patient preferred RW without platform in room and performed grooming, standing at toilet to urinate, and shower transfers with verbal cues for safety.  Patient often would sit before turning completely around and would reach back with LUE only. Functional mobility performed with and without platform with patient demonstrating better RUE positioning with platform. Acute OT to continue to follow.    Recommendations for follow up therapy are one component of a multi-disciplinary discharge planning process, led by the attending physician.  Recommendations may be updated based on patient status, additional functional criteria and insurance authorization.    Follow Up Recommendations  Home health OT    Equipment Recommendations  Tub/shower bench    Recommendations for Other Services Rehab consult    Precautions / Restrictions Precautions Precautions: Fall Precaution Comments: RUE contracture Restrictions Weight Bearing Restrictions: No       Mobility Bed Mobility               General bed mobility comments: In recliner upon entry    Transfers Overall transfer level: Needs assistance Equipment used: Rolling walker (2 wheeled) Transfers: Sit to/from [facility] Sit to Stand: Min guard;Supervision Stand pivot transfers: Min guard       General transfer comment: required cues to reach back before sitting and to turn completely before sitting    Balance Overall balance assessment:  Needs assistance Sitting-balance support: No upper extremity supported Sitting balance-Leahy Scale: Good     Standing balance support: No upper extremity supported;During functional activity Standing balance-Leahy Scale: Poor Standing balance comment: able to stand at sink for grooming using BUE                           ADL either performed or assessed with clinical judgement   ADL Overall ADL's : Needs assistance/impaired     Grooming: Wash/dry hands;Wash/dry face;Oral care;Standing;Supervision/safety Grooming Details (indicate cue type and reason): patient stood at sink for grooming and was able to use RUE to assist                         Tub/ Shower Transfer: Walk-in shower;Min guard;Grab Paediatric nurse Details (indicate cue type and reason): required verbal cues to use rail correct and safety with stepping into shower Functional mobility during ADLs: Min guard;Rolling walker General ADL Comments: patient required cues for safety with rw and when sitting     Vision       Perception     Praxis      Cognition Arousal/Alertness: Awake/alert Behavior During Therapy: WFL for tasks assessed/performed;Impulsive Overall Cognitive Status: Within Functional Limits for tasks assessed Area of Impairment: Problem solving;Safety/judgement;Memory                     Memory: Decreased short-term memory   Safety/Judgement: Decreased awareness of safety;Decreased awareness of deficits   Problem Solving: Slow processing General Comments: required frequent cues for safety with transfers        Exercises  Shoulder Instructions       General Comments      Pertinent Vitals/ Pain       Pain Assessment: No/denies pain  Home Living                                          Prior Functioning/Environment              Frequency  Min 2X/week        Progress Toward Goals  OT Goals(current goals can now be  found in the care plan section)  Progress towards OT goals: Progressing toward goals  Acute Rehab OT Goals Patient Stated Goal: patient prefers to go home, but understands he needs rehab and family encouraging inpatient rehab. OT Goal Formulation: With patient Time For Goal Achievement: 03/06/21 Potential to Achieve Goals: Good ADL Goals Pt Will Perform Grooming: with modified independence;standing Pt Will Perform Lower Body Bathing: with modified independence;sit to/from stand Pt Will Perform Lower Body Dressing: with modified independence;sit to/from stand Pt Will Transfer to Toilet: with modified independence;ambulating;regular height toilet Pt Will Perform Tub/Shower Transfer: with modified independence;ambulating;shower seat  Plan Discharge plan remains appropriate    Co-evaluation                 AM-PAC OT "6 Clicks" Daily Activity     Outcome Measure   Help from another person eating meals?: None Help from another person taking care of personal grooming?: None Help from another person toileting, which includes using toliet, bedpan, or urinal?: A Little Help from another person bathing (including washing, rinsing, drying)?: A Little Help from another person to put on and taking off regular upper body clothing?: None Help from another person to put on and taking off regular lower body clothing?: A Little 6 Click Score: 21    End of Session Equipment Utilized During Treatment: Rolling walker;Gait belt  OT Visit Diagnosis: Unsteadiness on feet (R26.81);Muscle weakness (generalized) (M62.81);Hemiplegia and hemiparesis Hemiplegia - Right/Left: Right Hemiplegia - dominant/non-dominant: Non-Dominant Hemiplegia - caused by: Cerebral infarction   Activity Tolerance Patient tolerated treatment well   Patient Left with call bell/phone within reach;with chair alarm set;with family/visitor present   Nurse Communication Mobility status        Time: 2395-3202 OT Time  Calculation (min): 26 min  Charges: OT General Charges $OT Visit: 1 Visit OT Treatments $Self Care/Home Management : 8-22 mins $Therapeutic Activity: 8-22 mins  Lodema Hong, OTA   Wayne [name] 02/24/2021, 10:50 AM

## 2021-02-28 NOTE — Progress Notes (Signed)
Patient's son called back regarding pt's [place] services. Per pt's son, [facility] agency denies care and pt/family not sure what to. I took information and consult 3W CM/SW.

## 2021-03-04 ENCOUNTER — Encounter: Payer: Self-pay | Admitting: Internal Medicine

## 2021-03-04 ENCOUNTER — Ambulatory Visit: Payer: Medicare Other | Admitting: Internal Medicine

## 2021-03-04 ENCOUNTER — Other Ambulatory Visit: Payer: Self-pay

## 2021-03-04 VITALS — BP 140/78 | HR 60 | Temp 98.0°F | Ht 67.0 in | Wt 168.6 lb

## 2021-03-04 DIAGNOSIS — Z01811 Encounter for preprocedural respiratory examination: Secondary | ICD-10-CM | POA: Diagnosis not present

## 2021-03-04 DIAGNOSIS — Z23 Encounter for immunization: Secondary | ICD-10-CM

## 2021-03-04 DIAGNOSIS — J449 Chronic obstructive pulmonary disease, unspecified: Secondary | ICD-10-CM

## 2021-03-04 NOTE — Patient Instructions (Addendum)
Please schedule follow up scheduled with myself in 3 months.  If my schedule is not open yet, we will contact you with a reminder closer to that time.  Before your next visit I would like you to have: Full set of PFTs  Start taking breztri again. 2 puffs twice a day. Gargle after use.  Start taking albuterol inhaler as needed.   Understanding COPD   What is COPD? COPD stands for chronic obstructive pulmonary (lung) disease. COPD is a general term used for several lung diseases.  COPD is an umbrella term and encompasses other  common diseases in this group like chronic bronchitis and emphysema. Chronic asthma may also be included in this group. While some patients with COPD have only chronic bronchitis or emphysema, most patients have a combination of both.  You might hear these terms used in exchange for one another.   COPD adds to the work of the heart. Diseased lungs may reduce the amount of oxygen that goes to the blood. High blood pressure in blood vessels from the heart to the lungs makes it difficult for the heart to pump. Lung disease can also cause the body to produce too many red blood cells which may make the blood thicker and harder to pump.   Patients who have COPD with low oxygen levels may develop an enlarged heart (cor pulmonale). This condition weakens the heart and causes increased shortness of breath and swelling in the legs and feet.   Chronic bronchitis Chronic bronchitis is irritation and inflammation (swelling) of the lining in the bronchial tubes (air passages). The irritation causes coughing and an excess amount of mucus in the airways. The swelling makes it difficult to get air in and out of the lungs. The small, hair-like structures on the inside of the airways (called cilia) may be damaged by the irritation. The cilia are then unable to help clean mucus from the airways.  Bronchitis is generally considered to be chronic when you have: a productive cough (cough up mucus)  and shortness of breath that lasts about 3 months or more each year for 2 or more years in a row. Your doctor may define chronic bronchitis differently.   Emphysema Emphysema is the destruction, or breakdown, of the walls of the alveoli (air sacs) located at the end of the bronchial tubes. The damaged alveoli are not able to exchange oxygen and carbon dioxide between the lungs and the blood. The bronchioles lose their elasticity and collapse when you exhale, trapping air in the lungs. The trapped air keeps fresh air and oxygen from entering the lungs.   Who is affected by COPD? Emphysema and chronic bronchitis affect approximately 16 million people in the [place], or close to 11 percent of the population.   Symptoms of COPD  Shortness of breath  Shortness of breath with mild exercise (walking, using the stairs, etc.)  Chronic, productive cough (with mucus)  A feeling of "tightness" in the chest  Wheezing   What causes COPD? The two primary causes of COPD are cigarette smoking and alpha1-antitrypsin (AAT) deficiency. Air pollution and occupational dusts may also contribute to COPD, especially when the person exposed to these substances is a cigarette smoker.  Cigarette smoke causes COPD by irritating the airways and creating inflammation that narrows the airways, making it more difficult to breathe. Cigarette smoke also causes the cilia to stop working properly so mucus and trapped particles are not cleaned from the airways. As a result, chronic cough and excess  mucus production develop, leading to chronic bronchitis.  In some people, chronic bronchitis and infections can lead to destruction of the small airways, or emphysema.  AAT deficiency, an inherited disorder, can also lead to emphysema. Alpha antitrypsin (AAT) is a protective material produced in the liver and transported to the lungs to help combat inflammation. When there is not enough of the chemical AAT, the body is no longer  protected from an enzyme in the white blood cells.   How is COPD diagnosed?  To diagnose COPD, the physician needs to know: Do you smoke?  Have you had chronic exposure to dust or air pollutants?  Do other members of your family have lung disease?  Are you short of breath?  Do you get short of breath with exercise?  Do you have chronic cough and/or wheezing?  Do you cough up excess mucus?  To help with the diagnosis, the physician will conduct a thorough physical exam which includes:  Listening to your lungs and heart  Checking your blood pressure and pulse  Examining your nose and throat  Checking your feet and ankles for swelling   Laboratory and other tests Several laboratory and other tests are needed to confirm a diagnosis of COPD. These tests may include:  Chest X-ray to look for lung changes that could be caused by COPD   Spirometry and pulmonary function tests (PFTs) to determine lung volume and air flow  Pulse oximetry to measure the saturation of oxygen in the blood  Arterial blood gases (ABGs) to determine the amount of oxygen and carbon dioxide in the blood  Exercise testing to determine if the oxygen level in the blood drops during exercise   Treatment In the beginning stages of COPD, there is minimal shortness of breath that may be noticed only during exercise. As the disease progresses, shortness of breath may worsen and you may need to wear an oxygen device.   To help control other symptoms of COPD, the following treatments and lifestyle changes may be prescribed.  Quitting smoking  Avoiding cigarette smoke and other irritants  Taking medications including: a. bronchodilators b. anti-inflammatory agents c. oxygen d. antibiotics  Maintaining a healthy diet  Following a structured exercise program such as pulmonary rehabilitation Preventing respiratory infections  Controlling stress   If your COPD progresses, you may be eligible to be evaluated for lung volume  reduction surgery or lung transplantation. You may also be eligible to participate in certain clinical trials (research studies). Ask your health care providers about studies being conducted in your hospital.   What is the outlook? Although COPD can not be cured, its symptoms can be treated and your quality of life can be improved. Your prognosis or outlook for the future will depend on how well your lungs are functioning, your symptoms, and how well you respond to and follow your treatment plan.

## 2021-03-04 NOTE — Progress Notes (Signed)
Wayne [name]    [id number]    Nov 16, 1945  Primary Care Physician:Stowe, Rachel Moulds, NP  Referring Physician: Practice, [facility] [place],   [id number] Reason for Consultation: COPD Date of Consultation: 03/04/2021  Chief complaint:   Chief Complaint  Patient presents with   Consult    Self Referral, Surgical Clearance  COPD      HPI: Wayne [name] [name] is a 75 y.o. man with history fo COPD, tobaco use, recent hospitalization for pontine CVA. Discharged last week for CVA, was outside the window for tpa. Started on asa and plavix, discharged to home PT.  Appetite reduced. Has lost about 12 lbs unintentionally since this ordeal. Referred by primary care NP. Has a bladder mass and is supposed to have bladder surgery - TURBTT with instillation of chemotherapy. Has already seen cardiology.   Has known about COPD for 20 years. He stopped taking inhalers since he quit smoking in the hospital. Feeling very sob at home now.   Using a cane at home for ambulation. Minimal activity since he was discharged home from the hospital. Wayne [name] 3 dyspnea.  No coughing.   Social history:  Occupation: retired, Programmer, applications.  Exposures: lives at home with wife, no animals. Smoking history: quit smoking 14 days when hospitalized for stroke. 60x 20 packs per day. 120 pack year smoking history. was  Social History   Occupational History   Not on file  Tobacco Use   Smoking status: Every Day    Packs/day: 2.00    Types: Cigarettes   Smokeless tobacco: Never  Substance and Sexual Activity   Alcohol use: Yes    Comment: occasional   Drug use: No   Sexual activity: Not on file    Relevant family history:  Family History  Problem Relation Age of Onset   Heart attack Mother    Diabetes Father    COPD Father    Diverticulitis Brother     Past Medical History:  Diagnosis Date   BPH (benign prostatic hyperplasia)    COPD (chronic  obstructive pulmonary disease) ([place])    Diabetes mellitus without complication (HCC)    GERD (gastroesophageal reflux disease)    Hypertension    Male erectile disorder    Meniere's disease    Mixed hyperlipidemia    Nontoxic single thyroid nodule    Somnolence    Thoracic aortic aneurysm    per CT chest 08/29/20 in Epic - stable diffuse aneurysmal dilation of the descending thoracic aorta with associated significant atheroschlerosis. 3.8 cm in maximum diameter.    Past Surgical History:  Procedure Laterality Date   CATARACT EXTRACTION Bilateral    ELBOW SURGERY Right      Physical Exam: Blood pressure 140/78, pulse 60, temperature 98 F (36.7 C), temperature source Oral, height 5\' 7"  (1.702 m), weight 168 lb 9.6 oz (76.5 kg), SpO2 96 %. Gen:      No acute distress, kyphosis ENT:  no nasal polyps, mucus membranes moist Lungs:   diminished bilaterally, no wheezes or crackles CV:         Regular rate and rhythm; no murmurs, rubs, or gallops.  No pedal edema Abd:      + bowel sounds; soft, non-tender; no distension MSK: no acute synovitis of DIP or PIP joints, no mechanics hands.  Skin:      Warm and dry; no rashes Neuro: normal speech, no focal facial asymmetry,  ambulates with cane Psych: alert and oriented x3, normal mood and affect   Data Reviewed/Medical Decision Making:  Independent interpretation of tests: Imaging:  Review of patient's CT Angio head and neck lung windows 02/2021 chest images revealed centrilobular. The patient's images have been independently reviewed by me.    PFTs: No flowsheet data found.  Labs:  Lab Results  Component Value Date   WBC 7.6 02/19/2021   HGB 14.0 02/19/2021   HCT 40.9 02/19/2021   MCV 89.3 02/19/2021   PLT 259 02/19/2021     Immunization status:  Immunization History  Administered Date(s) Administered   Fluad Quad(high Dose 65+) 01/25/2020   Influenza-Unspecified 03/18/2018, 01/25/2020   Pneumococcal Polysaccharide-23  01/25/2020     I reviewed prior external note(s) from hospital stays, PCP  I reviewed the result(s) of the labs and imaging as noted above.   I have ordered PFT   Assessment:  COPD, progressing Tobacco use disorder, recent cessation Preoperative pulmonary evaluation Need for influenza vaccination  Plan/Recommendations:  He is at moderately increased risk for postoperative pulmonary complications based on my assessment below. However he just had a CVA so I suspect that puts him at increased risk more so than the COPD. However if this is for bladder cancer then appropriate risk benefit conversation should be had. He does not have any contraindications to surgery from a purely pulmonary perspective.  Will obtain a full set of PFTs since he has never had any.  Resume breztri and albuterol prn.   Will give him his high dose flu shot today.    We discussed disease management and progression at length today regarding COPD.    Return to Care: Return in about 3 months (around 06/04/2021).  Lenice Llamas, MD Pulmonary and [place]  CC: [facility]   ASSESSMENT AND RECOMMENDATIONS:  Preoperative Risk Calculation: The features of this patient's history that contribute to the pulmonary risk assessment include: Age, COPD, General anesthesia, throacic surgery, surgery >2 hours.  This patient has an intermediate risk of post-operative pulmonary complications by ARISCAT Index.  The absolute assessment of risk/benefit of the procedure is deferred to the primary team's evaluation.  - Patient's Estimated risk of postoperative respiratory failure is 13.3% based on the ARISCAT Index.   0 to 25 points: Low risk: 9.3% pulmonary complication rate  26 to 44 points: Intermediate risk: 73.4% pulmonary complication rate  45 to 123 points: High risk: 28.7% pulmonary complication rate  Postoperative respiratory failure (PRF) is  considered as failure to wean from mechanical ventilation within 48 hours of surgery or unplanned intubation/reintubation postoperatively. The validated risk calculator provides a risk estimate of PRF and is anticipated to aid in surgical decision-making and informed patient consent.  However risk can be accepted given the potential benefit of this intervention and it is not prohibitive.  RECOMMENDATIONS:  In order to minimize the risk of complications and optimize pulmonary status, we recommend the following: - Encourage aggressive incentive spirometry hourly both peri-operatively and post-operatively as tolerated  - Early ambulation and physical therapy as tolerated post-operatively - Adequate pain control especially in the setting of abdominal and thoracic surgery - Bronchodilators as needed for wheezing or shortness of breath - Ideally we recommend smoking cessation for >4 weeks before any intervention - Intraoperatively keep OR time to the shortest as possible    ARISCAT: Mazo et al. Anesthesiology 2014; [phone]

## 2021-03-27 ENCOUNTER — Other Ambulatory Visit: Payer: Self-pay

## 2021-03-27 ENCOUNTER — Encounter: Payer: Self-pay | Admitting: Neurology

## 2021-03-27 ENCOUNTER — Ambulatory Visit: Payer: Medicare Other | Admitting: Neurology

## 2021-03-27 VITALS — BP 135/79 | HR 66 | Ht 67.0 in | Wt 178.0 lb

## 2021-03-27 DIAGNOSIS — J438 Other emphysema: Secondary | ICD-10-CM | POA: Diagnosis not present

## 2021-03-27 DIAGNOSIS — I951 Orthostatic hypotension: Secondary | ICD-10-CM | POA: Diagnosis not present

## 2021-03-27 DIAGNOSIS — G3184 Mild cognitive impairment, so stated: Secondary | ICD-10-CM

## 2021-03-27 DIAGNOSIS — I639 Cerebral infarction, unspecified: Secondary | ICD-10-CM | POA: Diagnosis not present

## 2021-03-27 DIAGNOSIS — E1142 Type 2 diabetes mellitus with diabetic polyneuropathy: Secondary | ICD-10-CM

## 2021-03-27 DIAGNOSIS — Z716 Tobacco abuse counseling: Secondary | ICD-10-CM | POA: Diagnosis not present

## 2021-03-27 NOTE — Progress Notes (Signed)
[facility]  PATIENT: Wayne [name] DOB: Dec 24, 1945  REFERRING DOCTOR OR PCP: Rosalin Hawking, MD (Stroke Hospitalist); Cipriano Mile (PCP) SOURCE: Patient, notes from recent hospital admission, imaging and lab results, MRI and CT images personally reviewed.  _________________________________   HISTORICAL  CHIEF COMPLAINT:  Chief Complaint  Patient presents with   Follow-up    Rm 2, w wife Caren Griffins. Pt seen at the ED on 02/18/21, for CVA and here to f/u. Pt still having dizziness and balance problems. Ambulates w a cane.     HISTORY OF PRESENT ILLNESS:  I had the pleasure of seeing your patient, Wayne [name], [name]., at [facility] for neurologic consultation regarding his recent pontine stroke and his frequent dizziness.  He is a 75 year old man who presented to the [facility] ED 02/18/2021 due to weakness in his right leg x 1 day and dizziness.    An MRI showed a left pontine CVA and he was admitted.   Exam by Dr. Erlinda Hong showed Right nasolabial fold flattening, mild facial droop.  Tongue midline.  Left upper extremity 5/5.  Right upper extremity proximal 5 -/5, right elbow fixed due to previous injury.  Right hand grip 5 -/5, slightly decreased finger dexterity.  Left lower extremity 5/5, right lower extremity proximal 4+/5, distal DF/PF 5 -/5.  Finger-to-nose slow around right.   Due to the lacunar infarction, he was advised to quit smoking and was placed on DAPT plavix/asa x 3 weeks then ASA 81 mg monotherapy .  He was also on Lipitor 40 mg     Since being discharged, he has noted mild improvement but still reports poor gait, reduced balance and reduced right leg strength.   He notes that the left arm jerks a few times a day since the hospitalization, as well.     Vascular risks:  Diabetes, hypertension, hyperlipidemia, smoking (was 2 ppd x many years, just quit last month  MRI showed left pontine infarct.  CT head and neck intracranial  atherosclerosis but no severe stenosis.  EF 60 to 65%.  LDL 58, A1c 5.8.  Creatinine 0.80.  He has dizziness that is chronic, preceding the recent stroke.   He was diagnosed with Menieres once in the past though he reports that he never had episodes of severe vomiting associated with vertigo.Marland Kitchen   He notes he does not have vertigo when sitting or laying but has vertigo many times after standing up.  He has had some difficulties with memory but has been fairly stable over the past few years.  He has been diagnosed with mild cognitive impairment in the past.  MRI of the brain does show advanced chronic microvascular ischemic change and atrophy.  He has type II non-insulin-dependent diabetes mellitus and is on metformin.  He sometimes reports numbness in his toes but not above the ankles  Additionally, he has a long history of smoking and has advanced COPD.  Imaging studies from the hospital were personally reviewed and I concur with the impression. MRi Brain 02/18/2021 showed 1. Motion degraded exam. 2. 1.2 cm acute ischemic nonhemorrhagic left pontine infarct. 3. Age-related cerebral atrophy with moderately advanced chronic microvascular ischemic disease. 4. 1.4 cm probable meningioma along the posterior falx. No associated mass effect.  CTA head/neck 02/19/2021 showed: 1. Hypodensity in the left pons consistent with evolving infarct as seen on the prior brain MRI. No evidence of hemorrhage. 2. Patent intracranial vasculature with minimal atherosclerotic disease. Specifically, the basilar artery is widely patent.  3. Mild soft and calcified atherosclerotic plaque in the bilateral carotid bulbs without hemodynamically significant stenosis. Patent vertebral arteries. 4. Tortuosity of the right internal carotid artery may be due to hypertension. 5. Unchanged global parenchymal volume loss and chronic white matter microangiopathy. 6. Severe emphysema in the lung apices.    REVIEW OF  SYSTEMS: Constitutional: No fevers, chills, sweats, or change in appetite Eyes: No visual changes, double vision, eye pain Ear, nose and throat: No hearing loss, ear pain, nasal congestion, sore throat Cardiovascular: No chest pain, palpitations Respiratory: He has shortness of breath at rest or with exertion due to COPD.   No wheezes.  He recently quit smoking. GastrointestinaI: No nausea, vomiting, diarrhea, abdominal pain, fecal incontinence Genitourinary:  No dysuria, urinary retention or frequency.  No nocturia. Musculoskeletal:  No neck pain, back pain Integumentary: No rash, pruritus, skin lesions Neurological: as above Psychiatric: No depression at this time.  No anxiety Endocrine: No palpitations, diaphoresis, change in appetite, change in weigh or increased thirst Hematologic/Lymphatic:  No anemia, purpura, petechiae. Allergic/Immunologic: No itchy/runny eyes, nasal congestion, recent allergic reactions, rashes  ALLERGIES: Allergies  Allergen Reactions   Gabapentin Other (See Comments)    Adverse reaction   Sertraline Other (See Comments)    Adverse reaction   Metformin Diarrhea and Nausea Only   Pineapple Hives   Pravastatin Other (See Comments)   Lorazepam Other (See Comments)    Shakiness    HOME MEDICATIONS:  Current Outpatient Medications:    acetaminophen (TYLENOL) 500 MG tablet, Take 1 tablet (500 mg total) by mouth every 6 (six) hours as needed., Disp: 30 tablet, Rfl: 0   albuterol (PROVENTIL) (2.5 MG/3ML) 0.083% nebulizer solution, Inhale 2.5 mg into the lungs every 6 (six) hours as needed for shortness of breath or wheezing., Disp: , Rfl:    albuterol (VENTOLIN HFA) 108 (90 Base) MCG/ACT inhaler, Inhale 1-2 puffs into the lungs every 6 (six) hours as needed for shortness of breath or wheezing., Disp: , Rfl:    amLODipine (NORVASC) 10 MG tablet, Take 10 mg by mouth daily., Disp: , Rfl:    aspirin EC 81 MG EC tablet, Take 1 tablet (81 mg total) by mouth daily.  Swallow whole., Disp: 30 tablet, Rfl: 11   atorvastatin (LIPITOR) 40 MG tablet, TAKE 1 TABLET BY MOUTH  DAILY (Patient taking differently: Take 40 mg by mouth daily.), Disp: 90 tablet, Rfl: 3   Blood Glucose Monitoring Suppl (ONE TOUCH ULTRA 2) w/Device KIT, Use daily to check blood sugar E11.9, Disp: 1 kit, Rfl: 0   Budeson-Glycopyrrol-Formoterol (BREZTRI AEROSPHERE) 160-9-4.8 MCG/ACT AERO, Inhale 1 puff into the lungs in the morning and at bedtime., Disp: , Rfl:    eszopiclone (LUNESTA) 1 MG TABS tablet, Take 1 mg by mouth at bedtime., Disp: , Rfl:    finasteride (PROSCAR) 5 MG tablet, Take 1 tablet (5 mg total) by mouth at bedtime., Disp: 90 tablet, Rfl: 2   fluticasone (FLONASE) 50 MCG/ACT nasal spray, USE 2 SPRAY(S) IN EACH NOSTRIL ONCE DAILY AS NEEDED FOR ALLERGIES OR RHIITIS (Patient taking differently: Place 2 sprays into both nostrils daily as needed for allergies.), Disp: 16 g, Rfl: 0   glucose blood test strip, Use new test strip each time when checking blood sugar, Disp: 100 each, Rfl: 2   Melatonin 10 MG TABS, Take 10 mg by mouth at bedtime as needed (sleep)., Disp: , Rfl:    metFORMIN (GLUCOPHAGE) 500 MG tablet, Take 500 mg by mouth daily., Disp: , Rfl:  omeprazole (PRILOSEC) 40 MG capsule, TAKE 1 CAPSULE BY MOUTH  DAILY (Patient taking differently: Take 40 mg by mouth daily.), Disp: 90 capsule, Rfl: 3   OneTouch Delica Lancets 48J MISC, Use a new lancet each time when checking blood sugar, Disp: 100 each, Rfl: 2   pyridostigmine (MESTINON) 60 MG tablet, Take 0.5 tablets (30 mg total) by mouth 3 (three) times daily., Disp: 45 tablet, Rfl: 5   sildenafil (VIAGRA) 100 MG tablet, TAKE 1 TABLET BY MOUTH AS NEEDED APPROXIMATELY 1 HOUR PRIOR TO SEXUAL ACTIVITY (Patient taking differently: Take 100 mg by mouth as needed for erectile dysfunction.), Disp: 10 tablet, Rfl: 2   tamsulosin (FLOMAX) 0.4 MG CAPS capsule, TAKE 1 CAPSULE BY MOUTH  DAILY, Disp: 90 capsule, Rfl: 3   topiramate (TOPAMAX)  25 MG tablet, Take 75 mg by mouth at bedtime., Disp: , Rfl:   PAST MEDICAL HISTORY: Past Medical History:  Diagnosis Date   BPH (benign prostatic hyperplasia)    COPD (chronic obstructive pulmonary disease) ([place])    Diabetes mellitus without complication (HCC)    GERD (gastroesophageal reflux disease)    Hypertension    Male erectile disorder    Meniere's disease    Mixed hyperlipidemia    Nontoxic single thyroid nodule    Somnolence    Thoracic aortic aneurysm    per CT chest 08/29/20 in Epic - stable diffuse aneurysmal dilation of the descending thoracic aorta with associated significant atheroschlerosis. 3.8 cm in maximum diameter.    PAST SURGICAL HISTORY: Past Surgical History:  Procedure Laterality Date   CATARACT EXTRACTION Bilateral    ELBOW SURGERY Right     FAMILY HISTORY: Family History  Problem Relation Age of Onset   Heart attack Mother    Diabetes Father    COPD Father    Diverticulitis Brother     SOCIAL HISTORY:  Social History   Socioeconomic History   Marital status: Married    Spouse name: Not on file   Number of children: 3   Years of education: Not on file   Highest education level: Not on file  Occupational History   Not on file  Tobacco Use   Smoking status: Every Day    Packs/day: 2.00    Types: Cigarettes   Smokeless tobacco: Never  Substance and Sexual Activity   Alcohol use: Yes    Comment: occasional   Drug use: No   Sexual activity: Not on file  Other Topics Concern   Not on file  Social History Narrative   Not on file   Social Determinants of Health   Financial Resource Strain: Not on file  Food Insecurity: Not on file  Transportation Needs: Not on file  Physical Activity: Not on file  Stress: Not on file  Social Connections: Not on file  Intimate Partner Violence: Not on file     PHYSICAL EXAM  Vitals:   03/27/21 1319  BP: 135/79  Pulse: 66  Weight: 178 lb (80.7 kg)  Height: $Remove'5\' 7"'[id number]$  (1.702 m)    Body mass  index is 27.88 kg/m.  Orthostatic VS Supine:      125/70 72 Sitting:       125/75 76 Stand 30s  110/60  76 Stand 120s  100/60 80  General: The patient is well-developed and well-nourished and in no acute distress  HEENT:  Head is [place]/AT.  Sclera are anicteric.     Neck: No carotid bruits are noted.  The neck is nontender.  Lungs/ Cardiovascular:  Distant heart sounds, mild wheeze, breathes with pursed lips.   The heart has a regular rate and rhythm with a normal S1 and S2. There were no murmurs, gallops or rubs.    Skin: Extremities are without rash or  edema.  Musculoskeletal:  Back is nontender  Neurologic Exam  Mental status: The patient is alert and oriented x 3 at the time of the examination. The patient has apparent normal recent and remote memory, with an apparently normal attention span and concentration ability.   Speech is normal.  Cranial nerves: Extraocular movements are full.   There is good facial sensation to soft touch bilaterally.Facial strength is normal.  Trapezius and sternocleidomastoid strength is normal. No dysarthria is noted.  The tongue is midline, and the patient has symmetric elevation of the soft palate.  He has hearing deficits  Motor:  Muscle bulk is normal.   Tone is normal. Strength is  5 / 5 in all 4 extremities except 4/5 right iliopsoas and right toe extension.  4+ right ankle extension.  Sensory: Sensory testing is intact to pinprick, soft touch and vibration sensation in arms bu but he has reduced pinprick in toes and reduced vibration at ankles, with further reduced at toes.    Coordination: Cerebellar testing reveals good finger-nose-finger and heel-to-shin bilaterally.  Gait and station: Station is normal.  He has a poor gait with reduced stride.  He can walk without support but does better with a cane.  I did not test tandem gait due to his poor standard gait.. Romberg is negative.   Reflexes: Deep tendon reflexes are symmetric and normal  bilaterally.   Plantar responses are flexor.    DIAGNOSTIC DATA (LABS, IMAGING, TESTING) - I reviewed patient records, labs, notes, testing and imaging myself where available.  Lab Results  Component Value Date   WBC 7.6 02/19/2021   HGB 14.0 02/19/2021   HCT 40.9 02/19/2021   MCV 89.3 02/19/2021   PLT 259 02/19/2021      Component Value Date/Time   NA 139 02/24/2021 0312   NA 141 08/13/2020 0957   K 3.2 (L) 02/24/2021 0312   CL 108 02/24/2021 0312   CO2 23 02/24/2021 0312   GLUCOSE 110 (H) 02/24/2021 0312   BUN 12 02/24/2021 0312   BUN 10 08/13/2020 0957   CREATININE 1.04 02/24/2021 0312   CALCIUM 8.6 (L) 02/24/2021 0312   PROT 6.9 02/18/2021 1221   PROT 7.3 08/13/2020 0957   ALBUMIN 3.7 02/18/2021 1221   ALBUMIN 4.4 08/13/2020 0957   AST 13 (L) 02/18/2021 1221   ALT 13 02/18/2021 1221   ALKPHOS 58 02/18/2021 1221   BILITOT 1.0 02/18/2021 1221   BILITOT 0.6 08/13/2020 0957   GFRNONAA >60 02/24/2021 0312   GFRAA 88 01/25/2020 1133   Lab Results  Component Value Date   CHOL 106 02/19/2021   HDL 32 (L) 02/19/2021   LDLCALC 58 02/19/2021   LDLDIRECT 170.3 07/02/2006   TRIG 81 02/19/2021   CHOLHDL 3.3 02/19/2021   Lab Results  Component Value Date   HGBA1C 5.8 (H) 02/19/2021   Lab Results  Component Value Date   VITAMINB12 491 09/18/2020   Lab Results  Component Value Date   TSH 2.540 10/20/2016       ASSESSMENT AND PLAN  Left pontine CVA (HCC)  Orthostatic hypotension  Encounter for smoking cessation counseling  Other emphysema (HCC)  Mild cognitive impairment  Diabetic polyneuropathy associated with type 2 diabetes mellitus ([place])   Mr.  Orvan Seen is a 75 year old man with a recent pontine stroke.  It is a lacunar infarction and he has multiple vascular risks including a long history of smoking, type 2 diabetes and hypertension.  He will continue aspirin and work on continuing risk factor reduction.  I congratulated him on his smoking cessation  and counseled him to remain off of tobacco products.  I advised him to eat a heart healthy diet.   The dizziness upon standing is consistent with orthostatic hypotension and he did show mild changes of blood pressure and as change in pulse upon standing.  The blood pressure dropped about 25/10 points.  This is troubling for him and, especially when combined with his poor gait, may increase his risk of fall.  Therefore, I will start Mestinon 30 mg p.o. 3 times daily.  Due to his multiple medical issues, I am more reluctant to start fludrocortisone or ProAmatine.  If he does not get benefit we will increase the dose to 60 mg 3 times daily and then consider one of the alternatives if ineffective He has mild diabetic neuropathy.  It is possible that that is contributing to the orthostatic hypotension.  Is not painful so no further treatment is necessary.  He is advised to continue to eat a healthy diabetic diet He will return to see me in 4 months or sooner if there are new or worsening neurologic symptoms.  Thank you for asked me to see Mr. Atkins.  Please let me know if unbilled further assistance with him or other patients in the future.  Cinnamon Morency A. Felecia Shelling, MD, [facility] 67/89/3810, 17:51 AM Certified in Neurology, Clinical Neurophysiology, Sleep Medicine and Neuroimaging  [facility] [address], [place], [place] [address] [phone]

## 2021-03-28 MED ORDER — PYRIDOSTIGMINE BROMIDE 60 MG PO TABS: 30.0000 mg | ORAL_TABLET | Freq: Three times a day (TID) | ORAL | 5 refills | Status: AC

## 2021-05-08 ENCOUNTER — Other Ambulatory Visit: Payer: Self-pay | Admitting: Family Medicine

## 2021-06-24 ENCOUNTER — Telehealth: Payer: Self-pay

## 2021-06-24 NOTE — Chronic Care Management (AMB) (Signed)
Chronic Care Management Pharmacy Assistant   Name: Wayne [name]  MRN: [id number] DOB: Feb 05, 1946   Reason for Encounter: General Adherence Call    Recent office visits:  None  Recent consult visits:  03/27/21 (Neurology) Arlice Colt MD. Seen for Left Pontine CVA. Started on Pyridostigmine Bromide 30mg  3 times daily. Stopped Plavix 75mg , B12 and Potassium Chloride.   03/24/21 (Cardiology) Renee Rival FNP. Seen for Thoracic Aortic Aneurysm. No med changes.   03/04/21 (Pulmonology) Lenice Llamas MD. Seen for COPD. No med changes.   02/14/21 (Cardiology) McGukin, Laban Emperor MD. Seen for CAD examination. D/C Amlodipine 5mg , Ezetimibe 10mg , Temazepam 15mg  and Metoprolol 25mg .   Hospital visits:  Medication Reconciliation was completed by comparing discharge summary, patients EMR and Pharmacy list, and upon discussion with patient.  Admitted to the hospital on 02/18/21 due to CVA. Discharge date was 02/24/21. Discharged from [place]?Medications Started at [facility] Discharge:?? -started Aspirin 81mg , Plavix 75mg  and Potassium Chloride   Medication Changes at Hospital Discharge: -Changed Amlodipine and Vitamin B12  Medications Discontinued at Hospital Discharge: -Stopped Bupropion 150mg , Duloxetine 60mg , Losartan 100mg , Metoprolol 25mg , Januvia 100mg , Tizanidine 4mg , and Trazodone 50mg .   -All other medications will remain the same.    Medications: Outpatient Encounter Medications as of 06/24/2021  Medication Sig   acetaminophen (TYLENOL) 500 MG tablet Take 1 tablet (500 mg total) by mouth every 6 (six) hours as needed.   albuterol (PROVENTIL) (2.5 MG/3ML) 0.083% nebulizer solution Inhale 2.5 mg into the lungs every 6 (six) hours as needed for shortness of breath or wheezing.   albuterol (VENTOLIN HFA) 108 (90 Base) MCG/ACT inhaler Inhale 1-2 puffs into the lungs every 6 (six) hours as needed for shortness of breath or wheezing.    amLODipine (NORVASC) 10 MG tablet Take 10 mg by mouth daily.   aspirin EC 81 MG EC tablet Take 1 tablet (81 mg total) by mouth daily. Swallow whole.   atorvastatin (LIPITOR) 40 MG tablet TAKE 1 TABLET BY MOUTH  DAILY (Patient taking differently: Take 40 mg by mouth daily.)   Blood Glucose Monitoring Suppl (ONE TOUCH ULTRA 2) w/Device KIT Use daily to check blood sugar E11.9   Budeson-Glycopyrrol-Formoterol (BREZTRI AEROSPHERE) 160-9-4.8 MCG/ACT AERO Inhale 1 puff into the lungs in the morning and at bedtime.   eszopiclone (LUNESTA) 1 MG TABS tablet Take 1 mg by mouth at bedtime.   finasteride (PROSCAR) 5 MG tablet Take 1 tablet (5 mg total) by mouth at bedtime.   fluticasone (FLONASE) 50 MCG/ACT nasal spray USE 2 SPRAY(S) IN EACH NOSTRIL ONCE DAILY AS NEEDED FOR ALLERGIES OR RHIITIS (Patient taking differently: Place 2 sprays into both nostrils daily as needed for allergies.)   glucose blood test strip Use new test strip each time when checking blood sugar   Melatonin 10 MG TABS Take 10 mg by mouth at bedtime as needed (sleep).   metFORMIN (GLUCOPHAGE) 500 MG tablet Take 500 mg by mouth daily.   omeprazole (PRILOSEC) 40 MG capsule TAKE 1 CAPSULE BY MOUTH  DAILY (Patient taking differently: Take 40 mg by mouth daily.)   OneTouch Delica Lancets 86L MISC Use a new lancet each time when checking blood sugar   pyridostigmine (MESTINON) 60 MG tablet Take 0.5 tablets (30 mg total) by mouth 3 (three) times daily.   sildenafil (VIAGRA) 100 MG tablet TAKE 1 TABLET BY MOUTH AS NEEDED APPROXIMATELY 1 HOUR PRIOR TO SEXUAL ACTIVITY (Patient taking differently: Take 100  mg by mouth as needed for erectile dysfunction.)   tamsulosin (FLOMAX) 0.4 MG CAPS capsule TAKE 1 CAPSULE BY MOUTH  DAILY   topiramate (TOPAMAX) 25 MG tablet Take 75 mg by mouth at bedtime.   No facility-administered encounter medications on file as of 06/24/2021.    Contacted [name] for general disease state and medication adherence  call.   Patient is > 5 days past due for refill on the following medications per chart history:  [facility] Medications: Medication Name/mg Last Fill Days Supply Metformin $RemoveBeforeDE'500mg'[id number]$   04/29/21 90ds     02/25/21 90ds Atorvastatin $RemoveBeforeDEI'40mg'[id number]$   05/26/21  90ds     02/28/21 90d   Care Gaps: Last annual wellness visit? None noted  Colonoscopy: 3/17/208 Last eye exam / retinopathy screening? Never done  Diabetic foot exam?08/13/20  Elray Mcgregor, [facility] Pharmacist Assistant  [phone]  I have been unsuccessful at completing this call

## 2021-06-25 ENCOUNTER — Other Ambulatory Visit: Payer: Self-pay

## 2021-06-25 ENCOUNTER — Ambulatory Visit: Payer: Medicare Other | Admitting: Internal Medicine

## 2021-06-25 ENCOUNTER — Ambulatory Visit (INDEPENDENT_AMBULATORY_CARE_PROVIDER_SITE_OTHER): Payer: Medicare Other | Admitting: Internal Medicine

## 2021-06-25 ENCOUNTER — Encounter: Payer: Self-pay | Admitting: Internal Medicine

## 2021-06-25 VITALS — BP 136/80 | HR 62 | Temp 97.8°F | Ht 67.0 in | Wt 188.4 lb

## 2021-06-25 DIAGNOSIS — J449 Chronic obstructive pulmonary disease, unspecified: Secondary | ICD-10-CM

## 2021-06-25 LAB — PULMONARY FUNCTION TEST
DL/VA % pred: 44 %
DL/VA: 1.76 ml/min/mmHg/L
DLCO cor % pred: 36 %
DLCO cor: 8.28 ml/min/mmHg
DLCO unc % pred: 36 %
DLCO unc: 8.28 ml/min/mmHg
FEF 25-75 Post: 0.38 L/sec
FEF 25-75 Pre: 0.33 L/sec
FEF2575-%Change-Post: 17 %
FEF2575-%Pred-Post: 20 %
FEF2575-%Pred-Pre: 17 %
FEV1-%Change-Post: 5 %
FEV1-%Pred-Post: 41 %
FEV1-%Pred-Pre: 39 %
FEV1-Post: 1.09 L
FEV1-Pre: 1.03 L
FEV1FVC-%Change-Post: 2 %
FEV1FVC-%Pred-Pre: 59 %
FEV6-%Change-Post: 10 %
FEV6-%Pred-Post: 67 %
FEV6-%Pred-Pre: 61 %
FEV6-Post: 2.32 L
FEV6-Pre: 2.1 L
FEV6FVC-%Change-Post: 8 %
FEV6FVC-%Pred-Post: 100 %
FEV6FVC-%Pred-Pre: 93 %
FVC-%Change-Post: 2 %
FVC-%Pred-Post: 67 %
FVC-%Pred-Pre: 66 %
FVC-Post: 2.47 L
FVC-Pre: 2.42 L
Post FEV1/FVC ratio: 44 %
Post FEV6/FVC ratio: 94 %
Pre FEV1/FVC ratio: 43 %
Pre FEV6/FVC Ratio: 87 %
RV % pred: 153 %
RV: 3.72 L
TLC % pred: 103 %
TLC: 6.7 L

## 2021-06-25 MED ORDER — BUDESONIDE 0.25 MG/2ML IN SUSP: 0.2500 mg | Freq: Every day | RESPIRATORY_TRACT | 5 refills | Status: DC

## 2021-06-25 MED ORDER — FORMOTEROL FUMARATE 20 MCG/2ML IN NEBU: 20.0000 ug | INHALATION_SOLUTION | Freq: Two times a day (BID) | RESPIRATORY_TRACT | 5 refills | Status: AC

## 2021-06-25 NOTE — Progress Notes (Signed)
PFT done today. 

## 2021-06-25 NOTE — Patient Instructions (Addendum)
Please schedule follow up scheduled with myself in 3 months.  If my schedule is not open yet, we will contact you with a reminder closer to that time. Please call [phone] if you haven't heard from [place] a month before.   Stop Breztri. Switch to Budesonide nebulizer once a day and fomoterol nebulizer twice a day. You can continue your albuterol inhaler as needed.   I am referring you to pulmonary rehab at [facility].

## 2021-06-25 NOTE — Addendum Note (Signed)
Addended by: Lorretta Harp on: 06/25/2021 05:03 PM   Modules accepted: Orders

## 2021-06-25 NOTE — Progress Notes (Signed)
Wayne [name]    [id number]    04-24-1946  Primary Care Physician:Stowe, Rachel Moulds, NP Date of Appointment: 06/25/2021 Established Patient Visit  Chief complaint:   Chief Complaint  Patient presents with   Follow-up    PFT performed today.  Pt states he has been okay since last visit. States still becomes SOB. Denies any complaints of coughing.     HPI: Wayne [name] is a 76 y.o. man with COPD, CVA October 2022 and Bladder cancer.   Interval Updates: Here for follow up after PFTS. FEV1 39% consitent with very severe airflow limitation.  Breztri didn't help him much, hard to use. Albuterol helps more and he likes nebulizer treatments more than inhaler.  He has become less active over the past few months due to breathing and neuropathy in his legs. Gets breathless with minimal exertion.   Bladder surgery currently on hold due to recent stroke, chronic heart and lung disease. He is due for a follow up soon.   I have reviewed the patient's family social and past medical history and updated as appropriate.   Past Medical History:  Diagnosis Date   BPH (benign prostatic hyperplasia)    COPD (chronic obstructive pulmonary disease) (HCC)    Diabetes mellitus without complication (HCC)    GERD (gastroesophageal reflux disease)    Hypertension    Male erectile disorder    Meniere's disease    Mixed hyperlipidemia    Nontoxic single thyroid nodule    Somnolence    Thoracic aortic aneurysm    per CT chest 08/29/20 in Epic - stable diffuse aneurysmal dilation of the descending thoracic aorta with associated significant atheroschlerosis. 3.8 cm in maximum diameter.    Past Surgical History:  Procedure Laterality Date   CATARACT EXTRACTION Bilateral    ELBOW SURGERY Right     Family History  Problem Relation Age of Onset   Heart attack Mother    Diabetes Father    COPD Father    Diverticulitis Brother     Social History   Occupational History   Not on  file  Tobacco Use   Smoking status: Former    Packs/day: 2.00    Years: 50.00    Pack years: 100.00    Types: Cigarettes    Quit date: 02/15/2021    Years since quitting: 0.3   Smokeless tobacco: Never  Substance and Sexual Activity   Alcohol use: Yes    Comment: occasional   Drug use: No   Sexual activity: Not on file     Physical Exam: Blood pressure 136/80, pulse 62, temperature 97.8 F (36.6 C), temperature source Oral, height 5\' 7"  (1.702 m), weight 188 lb 6.4 oz (85.5 kg), SpO2 95 %.  Gen:      No acute distress, barrel chested ENT:  no nasal polyps, mucus membranes moist Lungs:   Diminished, no wheezes or crackles CV:         Regular rate and rhythm; no murmurs, rubs, or gallops.  No pedal edema   Data Reviewed: Imaging: I have personally reviewed the lung windows in his ct angio head and neck - upper lobe emphysema.  PFTs:  PFT Results Latest Ref Rng & Units 06/25/2021  FVC-Pre L 2.42  FVC-Predicted Pre % 66  FVC-Post L 2.47  FVC-Predicted Post % 67  Pre FEV1/FVC % % 43  Post FEV1/FCV % % 44  FEV1-Pre L 1.03  FEV1-Predicted Pre % 39  FEV1-Post L 1.09  DLCO uncorrected ml/min/mmHg 8.28  DLCO UNC% % 36  DLCO corrected ml/min/mmHg 8.28  DLCO COR %Predicted % 36  DLVA Predicted % 44  TLC L 6.70  TLC % Predicted % 103  RV % Predicted % 153   I have personally reviewed the patient's PFTs and very severe airflow limitation and hyperinflation with air trapping and reduced dlco.   Labs:  Lab Results  Component Value Date   WBC 7.6 02/19/2021   HGB 14.0 02/19/2021   HCT 40.9 02/19/2021   MCV 89.3 02/19/2021   PLT 259 02/19/2021   Lab Results  Component Value Date   NA 139 02/24/2021   K 3.2 (L) 02/24/2021   CL 108 02/24/2021   CO2 23 02/24/2021     Immunization status: Immunization History  Administered Date(s) Administered   Fluad Quad(high Dose 65+) 01/25/2020, 03/04/2021   Influenza-Unspecified 03/18/2018, 01/25/2020   Pneumococcal  Polysaccharide-23 01/25/2020    External Records Personally Reviewed: neurology   Assessment:  Gold stage IV COPD with progression of disease Recent tobacco cessation Left Pontine CVA Bladder Cancer  Plan/Recommendations: Stop breztri. Will switch to nebulized budesonide and fomoterol since he does better with these meds.  Continue prn albuterol.  Refer to pulmonary rehab at [facility] at his request.   He is not a surgical candidate for resection with lung cancer screening. Can discuss LDCT at next visit/shared decision making visit.    Return to Care: Return in about 3 months (around 09/22/2021).   Lenice Llamas, MD Pulmonary and [place]

## 2021-06-27 ENCOUNTER — Telehealth: Payer: Self-pay | Admitting: Internal Medicine

## 2021-06-27 MED ORDER — BUDESONIDE 0.25 MG/2ML IN SUSP: 0.2500 mg | Freq: Every day | RESPIRATORY_TRACT | 5 refills | Status: DC

## 2021-06-27 NOTE — Telephone Encounter (Signed)
Called and spoke with pt's spouse Caren Griffins out the phone call received from [facility] in regards to Pulmicort neb sol. Asked if there was a different pharmacy that she wanted [place] to try to send Rx to for pt to see if they had it in stock and she [facility] [place]. Rx has been sent in for pt. Nothing further needed.

## 2021-06-30 ENCOUNTER — Telehealth: Payer: Self-pay | Admitting: Internal Medicine

## 2021-07-01 NOTE — Telephone Encounter (Signed)
I called the patient and she is going to check at the other pharmacies to see if they have this to be filled. She will call back if they want to get it filled at another pharmacy.   Nothing further.

## 2021-07-01 NOTE — Telephone Encounter (Signed)
Unfortunately he is intolerant of inhalers so that's why we switched to nebs. He might just have to wait or ask around anther pharmacy to see if them have them in stock.

## 2021-07-01 NOTE — Telephone Encounter (Signed)
I called the patient  and the wife is on DPR and she wants to know if there is a recommendations for the Budesonide? He does have the perform ist.  Please advise.

## 2021-08-26 ENCOUNTER — Encounter: Payer: Self-pay | Admitting: Neurology

## 2021-08-26 ENCOUNTER — Ambulatory Visit: Payer: Medicare Other | Admitting: Neurology

## 2021-08-26 VITALS — BP 142/90 | HR 80 | Ht 67.0 in | Wt 188.0 lb

## 2021-08-26 DIAGNOSIS — I639 Cerebral infarction, unspecified: Secondary | ICD-10-CM | POA: Diagnosis not present

## 2021-08-26 DIAGNOSIS — G4489 Other headache syndrome: Secondary | ICD-10-CM | POA: Diagnosis not present

## 2021-08-26 DIAGNOSIS — J438 Other emphysema: Secondary | ICD-10-CM | POA: Diagnosis not present

## 2021-08-26 DIAGNOSIS — I951 Orthostatic hypotension: Secondary | ICD-10-CM

## 2021-08-26 DIAGNOSIS — G3184 Mild cognitive impairment, so stated: Secondary | ICD-10-CM | POA: Diagnosis not present

## 2021-08-26 DIAGNOSIS — M542 Cervicalgia: Secondary | ICD-10-CM

## 2021-08-26 MED ORDER — FLUDROCORTISONE ACETATE 0.1 MG PO TABS: 0.1000 mg | ORAL_TABLET | Freq: Every day | ORAL | 11 refills | Status: AC

## 2021-08-26 NOTE — Progress Notes (Signed)
? ?[facility] ? ?PATIENT: Wayne [name] ?DOB: 03-28-1946 ? ?REFERRING DOCTOR OR PCP: Rosalin Hawking, MD (Stroke Hospitalist); Cipriano Mile (PCP) ?SOURCE: Patient, notes from recent hospital admission, imaging and lab results, MRI and CT images personally reviewed. ? ?_________________________________ ? ? ?HISTORICAL ? ?CHIEF COMPLAINT:  ?Chief Complaint  ?Patient presents with  ? Follow-up  ?  Rm 2, w wife. Here for 4 month f/u. Pt still has dizziness and HA. HA are in the back R side side of head/neck. Turning to the R aggravates HA. Using cane to ambulate.    ? ? ?HISTORY OF PRESENT ILLNESS:  ?Wayne [name], [name]. Is a 76 y.o. man with a pontine stroke and his frequent dizziness. ? ?Update 08/26/2021: ?He has dizziness that is chronic, preceding the recent pontine stroke.   He describes the dizziness as lightheadedness more than vertigo.  He has never had much nausea or any vomiting with the episodes.  He was once diagnosed with M?ni?re's disease though that is unlikely given his symptoms.  He never notes the lightheadedness when he is sitting or laying down but only shortly after he stands up or if he is up for a while  ? ?He did not have any benefit from mestinon for the and he stopped.   He feels off balanced and staggers more when he is lightheaded/dizzy.    At the last visit he had mild orthostatic hypotension.    ? ?He takes a BC powder daily for headache.   Headache is occipital and daily.   He wakes up wihout one but it buolds up as the day goes on.    ? ?He has poor gait, reduced balance and reduced right leg strength since the stroke (better but not baseline).  ? ?Stroke History: ?He is a 76 year old man who presented to the [facility] ED 02/18/2021 due to weakness in his right leg x 1 day and dizziness.    An MRI showed a left pontine CVA and he was admitted.   Exam by Dr. Erlinda Hong showed Right nasolabial fold flattening, mild facial droop.  Tongue midline.  Left upper extremity 5/5.  Right  upper extremity proximal 5 -/5, right elbow fixed due to previous injury.  Right hand grip 5 -/5, slightly decreased finger dexterity.  Left lower extremity 5/5, right lower extremity proximal 4+/5, distal DF/PF 5 -/5.  Finger-to-nose slow around right.   Due to the lacunar infarction, he was advised to quit smoking and was placed on DAPT plavix/asa x 3 weeks then ASA 81 mg monotherapy .  He was also on Lipitor 40 mg .   ? ?Vascular risks:  Diabetes, hypertension, hyperlipidemia, smoking (was 2 ppd x many years, just quit last month.   He has type II non-insulin-dependent diabetes mellitus and is on metformin.  He sometimes reports numbness in his toes but not above the ankles    Additionally, he has a long history of smoking and has advanced COPD. ? ?MRI showed left pontine infarct.  CT head and neck intracranial atherosclerosis but no severe stenosis.  EF 60 to 65%.  LDL 58, A1c 5.8.  Creatinine 0.80. ? ? ?Imaging studies from the hospital were personally reviewed and I concur with the impression. ?MRi Brain 02/18/2021 showed ?1. Motion degraded exam. ?2. 1.2 cm acute ischemic nonhemorrhagic left pontine infarct. ?3. Age-related cerebral atrophy with moderately advanced chronic ?microvascular ischemic disease. ?4. 1.4 cm probable meningioma along the posterior falx. No ?associated mass effect. ? ?CTA head/neck 02/19/2021  showed: ?1. Hypodensity in the left pons consistent with evolving infarct as ?seen on the prior brain MRI. No evidence of hemorrhage. ?2. Patent intracranial vasculature with minimal atherosclerotic ?disease. Specifically, the basilar artery is widely patent. ?3. Mild soft and calcified atherosclerotic plaque in the bilateral ?carotid bulbs without hemodynamically significant stenosis. Patent ?vertebral arteries. ?4. Tortuosity of the right internal carotid artery may be due to ?hypertension. ?5. Unchanged global parenchymal volume loss and chronic white matter ?microangiopathy. ?6. Severe emphysema  in the lung apices. ?  ? ?REVIEW OF SYSTEMS: ?Constitutional: No fevers, chills, sweats, or change in appetite ?Eyes: No visual changes, double vision, eye pain ?Ear, nose and throat: No hearing loss, ear pain, nasal congestion, sore throat ?Cardiovascular: No chest pain, palpitations ?Respiratory: He has shortness of breath at rest or with exertion due to COPD.   No wheezes.  He recently quit smoking. ?GastrointestinaI: No nausea, vomiting, diarrhea, abdominal pain, fecal incontinence ?Genitourinary:  No dysuria, urinary retention or frequency.  No nocturia. ?Musculoskeletal:  No neck pain, back pain ?Integumentary: No rash, pruritus, skin lesions ?Neurological: as above ?Psychiatric: No depression at this time.  No anxiety ?Endocrine: No palpitations, diaphoresis, change in appetite, change in weigh or increased thirst ?Hematologic/Lymphatic:  No anemia, purpura, petechiae. ?Allergic/Immunologic: No itchy/runny eyes, nasal congestion, recent allergic reactions, rashes ? ?ALLERGIES: ?Allergies  ?Allergen Reactions  ? Gabapentin Other (See Comments)  ?  Adverse reaction  ? Sertraline Other (See Comments)  ?  Adverse reaction  ? Metformin Diarrhea and Nausea Only  ? Pineapple Hives  ? Pravastatin Other (See Comments)  ? Lorazepam Other (See Comments)  ?  Shakiness  ? ? ?HOME MEDICATIONS: ? ?Current Outpatient Medications:  ?  albuterol (PROVENTIL) (2.5 MG/3ML) 0.083% nebulizer solution, Inhale 2.5 mg into the lungs every 6 (six) hours as needed for shortness of breath or wheezing., Disp: , Rfl:  ?  albuterol (VENTOLIN HFA) 108 (90 Base) MCG/ACT inhaler, Inhale 1-2 puffs into the lungs every 6 (six) hours as needed for shortness of breath or wheezing., Disp: , Rfl:  ?  amLODipine (NORVASC) 10 MG tablet, Take 10 mg by mouth daily., Disp: , Rfl:  ?  Aspirin-Salicylamide-Caffeine (BC HEADACHE POWDER PO), Take by mouth as needed., Disp: , Rfl:  ?  atorvastatin (LIPITOR) 40 MG tablet, TAKE 1 TABLET BY MOUTH  DAILY (Patient  taking differently: Take 40 mg by mouth daily.), Disp: 90 tablet, Rfl: 3 ?  Blood Glucose Monitoring Suppl (ONE TOUCH ULTRA 2) w/Device KIT, Use daily to check blood sugar E11.9, Disp: 1 kit, Rfl: 0 ?  budesonide (PULMICORT) 0.25 MG/2ML nebulizer solution, Take 2 mLs (0.25 mg total) by nebulization daily., Disp: 60 mL, Rfl: 5 ?  eszopiclone (LUNESTA) 1 MG TABS tablet, Take 1 mg by mouth at bedtime., Disp: , Rfl:  ?  finasteride (PROSCAR) 5 MG tablet, Take 1 tablet (5 mg total) by mouth at bedtime., Disp: 90 tablet, Rfl: 2 ?  fludrocortisone (FLORINEF) 0.1 MG tablet, Take 1 tablet (0.1 mg total) by mouth daily., Disp: 30 tablet, Rfl: 11 ?  fluticasone (FLONASE) 50 MCG/ACT nasal spray, USE 2 SPRAY(S) IN EACH NOSTRIL ONCE DAILY AS NEEDED FOR ALLERGIES OR RHIITIS (Patient taking differently: Place 2 sprays into both nostrils daily as needed for allergies.), Disp: 16 g, Rfl: 0 ?  formoterol (PERFOROMIST) 20 MCG/2ML nebulizer solution, Take 2 mLs (20 mcg total) by nebulization 2 (two) times daily., Disp: 120 mL, Rfl: 5 ?  glucose blood test strip, Use new test strip  each time when checking blood sugar, Disp: 100 each, Rfl: 2 ?  metFORMIN (GLUCOPHAGE) 500 MG tablet, Take 500 mg by mouth daily., Disp: , Rfl:  ?  omeprazole (PRILOSEC) 40 MG capsule, TAKE 1 CAPSULE BY MOUTH  DAILY (Patient taking differently: Take 40 mg by mouth daily.), Disp: 90 capsule, Rfl: 3 ?  OneTouch Delica Lancets 37D MISC, Use a new lancet each time when checking blood sugar, Disp: 100 each, Rfl: 2 ?  pyridostigmine (MESTINON) 60 MG tablet, Take 0.5 tablets (30 mg total) by mouth 3 (three) times daily., Disp: 45 tablet, Rfl: 5 ?  sildenafil (VIAGRA) 100 MG tablet, TAKE 1 TABLET BY MOUTH AS NEEDED APPROXIMATELY 1 HOUR PRIOR TO SEXUAL ACTIVITY (Patient taking differently: Take 100 mg by mouth as needed for erectile dysfunction.), Disp: 10 tablet, Rfl: 2 ?  tamsulosin (FLOMAX) 0.4 MG CAPS capsule, TAKE 1 CAPSULE BY MOUTH  DAILY, Disp: 90 capsule, Rfl:  3 ?  topiramate (TOPAMAX) 25 MG tablet, Take 75 mg by mouth at bedtime., Disp: , Rfl:  ? ?PAST MEDICAL HISTORY: ?Past Medical History:  ?Diagnosis Date  ? BPH (benign prostatic hyperplasia)   ? COPD

## 2021-09-11 ENCOUNTER — Encounter: Payer: Self-pay | Admitting: Internal Medicine

## 2021-09-11 ENCOUNTER — Ambulatory Visit: Payer: Medicare Other | Admitting: Internal Medicine

## 2021-09-11 VITALS — BP 136/82 | HR 76 | Temp 97.4°F | Ht 67.0 in | Wt 186.6 lb

## 2021-09-11 DIAGNOSIS — J449 Chronic obstructive pulmonary disease, unspecified: Secondary | ICD-10-CM

## 2021-09-11 MED ORDER — ALBUTEROL SULFATE HFA 108 (90 BASE) MCG/ACT IN AERS: 2.0000 | INHALATION_SPRAY | Freq: Four times a day (QID) | RESPIRATORY_TRACT | 11 refills | Status: DC | PRN

## 2021-09-11 NOTE — Progress Notes (Signed)
? ?      ?TAJ NEVINS Sr    [id number]    [birth year] ? ?Primary Care Physician:Stowe, Rachel Moulds, NP ?Date of Appointment: 09/11/2021 ?Established Patient Visit ? ?Chief complaint:   ?Chief Complaint  ?Patient presents with  ? Follow-up  ?  Increased cough/ wheezing/ SOB   ? ? ? ?HPI: ?Averey Trompeter is a 76 y.o. man with very severe COPD FEV1 39%, CVA October 2022 and Bladder cancer.  ? ?Interval Updates: ?Here for follow up. He is off all inhaler therapy. Was not able to afford performist or pulmicort nebs beyond the first two months. The performist and pulmicort fell under tier 4 and were too expensive.  ? ?His nebulizer machine is over 21 years old.  ? ?Still having ongoing dyspnea with exertion and feels oxygen is dropping at home.  ? ?He declined pulmonary rehab at [facility] - he says he was too breathless to exercise, and that he doesn't have time to go.  ? ?I have reviewed the patient's family social and past medical history and updated as appropriate.  ? ?Past Medical History:  ?Diagnosis Date  ? BPH (benign prostatic hyperplasia)   ? COPD (chronic obstructive pulmonary disease) ([place])   ? Diabetes mellitus without complication ([place])   ? GERD (gastroesophageal reflux disease)   ? Hypertension   ? Male erectile disorder   ? Meniere's disease   ? Mixed hyperlipidemia   ? Nontoxic single thyroid nodule   ? Somnolence   ? Thoracic aortic aneurysm ([place])   ? per CT chest 08/29/20 in Epic - stable diffuse aneurysmal dilation of the descending thoracic aorta with associated significant atheroschlerosis. 3.8 cm in maximum diameter.  ? ? ?Past Surgical History:  ?Procedure Laterality Date  ? CATARACT EXTRACTION Bilateral   ? ELBOW SURGERY Right   ? ? ?Family History  ?Problem Relation Age of Onset  ? Heart attack Mother   ? Diabetes Father   ? COPD Father   ? Diverticulitis Brother   ? ? ?Social History  ? ?Occupational History  ? Not on file  ?Tobacco Use  ? Smoking status: Former  ?  Packs/day: 2.00  ?   Years: 50.00  ?  Pack years: 100.00  ?  Types: Cigarettes  ?  Quit date: 02/15/2021  ?  Years since quitting: 0.5  ? Smokeless tobacco: Never  ?Substance and Sexual Activity  ? Alcohol use: Yes  ?  Comment: occasional  ? Drug use: No  ? Sexual activity: Not on file  ? ? ? ?Physical Exam: ?Blood pressure 136/82, pulse 76, temperature (!) 97.4 ?F (36.3 ?C), temperature source Oral, height '5\' 7"'$  (1.702 m), weight 186 lb 9.6 oz (84.6 kg), SpO2 93 %. ? ?Gen:      No acute distress, barrel chested ?ENT:  mmm ?Lungs:   diminished, no wheezes or crackles ?CV:         RRR no mrg ? ? ?Data Reviewed: ?Imaging: ?I have personally reviewed the lung windows in his ct angio head and neck - upper lobe emphysema. ? ?PFTs: ? ? ?  Latest Ref Rng & Units 06/25/2021  ?  1:12 PM  ?PFT Results  ?FVC-Pre L 2.42    ?FVC-Predicted Pre % 66    ?FVC-Post L 2.47    ?FVC-Predicted Post % 67    ?Pre FEV1/FVC % % 43    ?Post FEV1/FCV % % 44    ?FEV1-Pre L 1.03    ?FEV1-Predicted Pre % 39    ?  FEV1-Post L 1.09    ?DLCO uncorrected ml/min/mmHg 8.28    ?DLCO UNC% % 36    ?DLCO corrected ml/min/mmHg 8.28    ?DLCO COR %Predicted % 36    ?DLVA Predicted % 44    ?TLC L 6.70    ?TLC % Predicted % 103    ?RV % Predicted % 153    ? ?I have personally reviewed the patient's PFTs and very severe airflow limitation and hyperinflation with air trapping and reduced dlco.  ? ?Labs: ? ?Lab Results  ?Component Value Date  ? WBC 7.6 02/19/2021  ? HGB 14.0 02/19/2021  ? HCT 40.9 02/19/2021  ? MCV 89.3 02/19/2021  ? PLT 259 02/19/2021  ? ?Lab Results  ?Component Value Date  ? NA 139 02/24/2021  ? K 3.2 (L) 02/24/2021  ? CL 108 02/24/2021  ? CO2 23 02/24/2021  ? ? ? ?Immunization status: ?Immunization History  ?Administered Date(s) Administered  ? Fluad Quad(high Dose 65+) 01/25/2020, 03/04/2021  ? Influenza-Unspecified 03/18/2018, 01/25/2020  ? Pneumococcal Polysaccharide-23 01/25/2020  ? ? ?External Records Personally Reviewed: neurology  ? ?Assessment:  ?Gold stage IV  COPD with progression of disease FEV1 39% ?Recent tobacco cessation ?Left Pontine CVA ?Bladder Cancer ? ?Plan/Recommendations: ?Will try to get budesonide and performist filled through DME pharmacy - Medicare Part B ?Will get him a new nebulizer machine.  ?Continue and refill prn albuterol.  ? ?He declined pulmonary rehab despite referral.  ? ?I did ambulate him for home oxygen and he requires 2L.  ?Will prescribe oxygen with POC new start. ? ? ?He is not a surgical candidate for resection with lung cancer screening. ? ? ?Return to Care: ?Return in about 3 months (around 12/11/2021). ? ? ?Lenice Llamas, MD ?Pulmonary and Critical Care Medicine ?[place] ?Office:[phone] ? ? ? ? ? ?

## 2021-09-11 NOTE — Patient Instructions (Addendum)
Please schedule follow up scheduled with myself in 3 months.  If my schedule is not open yet, we will contact you with a reminder closer to that time. Please call [phone] if you haven't heard from [place] a month before.  ? ?I will get a new nebulizer machine to your home and try to get your meds covered through that. ? ?Albuterol inhaler refilled through mail order.  ?You can take albuterol inhaler or nebulizer up to 4 times a day as needed for shortness of breath.  ? ?Budesonide is twice a day.  ?Performist is twice a day.  ? ?We will get oxygen supplies and concentrator sent to your house.  ?

## 2021-09-16 ENCOUNTER — Other Ambulatory Visit: Payer: Self-pay | Admitting: *Deleted

## 2021-09-16 DIAGNOSIS — J449 Chronic obstructive pulmonary disease, unspecified: Secondary | ICD-10-CM

## 2021-09-16 MED ORDER — ALBUTEROL SULFATE HFA 108 (90 BASE) MCG/ACT IN AERS: 2.0000 | INHALATION_SPRAY | Freq: Four times a day (QID) | RESPIRATORY_TRACT | 3 refills | Status: AC | PRN

## 2021-09-17 ENCOUNTER — Telehealth: Payer: Self-pay | Admitting: Internal Medicine

## 2021-09-17 DIAGNOSIS — J438 Other emphysema: Secondary | ICD-10-CM

## 2021-09-17 NOTE — Telephone Encounter (Signed)
ATC Caren Griffins, LMTCB ?

## 2021-09-19 NOTE — Telephone Encounter (Signed)
Called patient's wife but she did not answer. Left message for her to call back.  ?

## 2021-09-22 NOTE — Telephone Encounter (Signed)
Patient's wife returning call about nebulizer medication. Additional question: ordered oxygen and patient wants to know if he needs to stay on it all the time or just as needed.  ? ?Please advise. Call back number is [phone]. States she did not get a message from Friday.  ?

## 2021-09-22 NOTE — Telephone Encounter (Signed)
Spoke with the pt's spouse  ?She is asking about alternatives to pulmicort and budesonide nebs  ?Pt unable to afford  ?She declined me sending to DME, has been getting from [facility] and does not want from DME  ? ?Pharm team- can you see about anything less expensive that is comparable? Thanks! ?

## 2021-09-23 ENCOUNTER — Ambulatory Visit: Payer: Medicare Other | Admitting: Internal Medicine

## 2021-09-23 ENCOUNTER — Other Ambulatory Visit (HOSPITAL_COMMUNITY): Payer: Self-pay

## 2021-09-23 NOTE — Telephone Encounter (Signed)
Called the pt and there was no answer- LMTCB for Wayne [name]  ?

## 2021-09-23 NOTE — Telephone Encounter (Signed)
Test billing results came back as $9.09 for a 30 day supply of the albuterol neb solution. There is not a script at the pharmacy for me to call and check to see if they returned the same copay.

## 2021-09-23 NOTE — Telephone Encounter (Signed)
I don't see a script at [facility], however test billing results were submitted and returned a copay of $27.90. Please include Dx: J44.9 when sending script to ensure proper billing with patients [employer] Card with [phone]

## 2021-09-23 NOTE — Telephone Encounter (Signed)
Wayne [name] wife is returning phone call. Wayne [name] phone number is [phone]. 

## 2021-09-23 NOTE — Telephone Encounter (Signed)
Called and spoke with patient. I made her aware of the information from the pharmacy. She then stated that it was the albuterol nebulizer solution that she was concerned about. She received a price of $76.00 for a month supply of the albuterol.  ? ?Pharmacy team, is there anyway we can do a test claim on the albuterol 0.083% solution? Thank you!  ?

## 2021-09-25 NOTE — Telephone Encounter (Signed)
Left message for patient's wife to call back.  

## 2021-10-02 MED ORDER — ALBUTEROL SULFATE (2.5 MG/3ML) 0.083% IN NEBU: 2.5000 mg | INHALATION_SOLUTION | Freq: Four times a day (QID) | RESPIRATORY_TRACT | 11 refills | Status: AC | PRN

## 2021-10-02 NOTE — Telephone Encounter (Signed)
Rx for albuterol solution has been sent to pharmacy for pt to see if we can see what the cost is.

## 2021-10-31 ENCOUNTER — Other Ambulatory Visit: Payer: Self-pay | Admitting: Family Medicine

## 2021-11-17 ENCOUNTER — Encounter: Payer: Self-pay | Admitting: Internal Medicine

## 2021-11-17 ENCOUNTER — Ambulatory Visit: Payer: Medicare Other | Admitting: Internal Medicine

## 2021-11-17 VITALS — BP 94/62 | HR 73 | Ht 67.0 in | Wt 180.8 lb

## 2021-11-17 DIAGNOSIS — J449 Chronic obstructive pulmonary disease, unspecified: Secondary | ICD-10-CM

## 2021-11-17 NOTE — Patient Instructions (Signed)
Please schedule follow up scheduled with myself in 4 months.  If my schedule is not open yet, we will contact you with a reminder closer to that time. Please call [phone] if you haven't heard from [place] a month before.   I will refer you to pulmonary rehab at [facility] They will call you to schedule.   I will work on getting two additional nebulizer medications for your COPD.   Continue fomoterol twice daily and albuterol up to 4 times a day as needed.   Keep oxygen levels 88%

## 2021-11-17 NOTE — Progress Notes (Signed)
Wayne [name]    [id number]    [birth year]  Primary Care Physician:Stowe, Rachel Moulds, NP Date of Appointment: 11/17/2021 Established Patient Visit  Chief complaint:   Chief Complaint  Patient presents with   Follow-up    3 mo f/u, pt using O2 at night on 3L. Still experiencing SOB on exertion w/ wheeze, no coughing.     HPI: Wayne [name] is a 76 y.o. man with very severe COPD FEV1 39% on [facility], CVA October 2022 and Bladder cancer.   Interval Updates: Here for follow up. He is taking fomoterol nebs and albuterol nebs. These are helping.   There is apparently a shortage on budesonide so he hasn't received this yet.   Wears oxygen at home when sats are under 94%. Has a puls ox.  Has POC at home. Feels it's too heavy.   No hospitalizations or ED visits since I last saw him.   Gets sob with  minimal exertion like taking out the trash.  Wants to feel better. But also previously declined pulmonary rehab.   I have reviewed the patient's family social and past medical history and updated as appropriate.   Past Medical History:  Diagnosis Date   BPH (benign prostatic hyperplasia)    COPD (chronic obstructive pulmonary disease) (HCC)    Diabetes mellitus without complication (HCC)    GERD (gastroesophageal reflux disease)    Hypertension    Male erectile disorder    Meniere's disease    Mixed hyperlipidemia    Nontoxic single thyroid nodule    Somnolence    Thoracic aortic aneurysm (HCC)    per CT chest 08/29/20 in Epic - stable diffuse aneurysmal dilation of the descending thoracic aorta with associated significant atheroschlerosis. 3.8 cm in maximum diameter.    Past Surgical History:  Procedure Laterality Date   CATARACT EXTRACTION Bilateral    ELBOW SURGERY Right     Family History  Problem Relation Age of Onset   Heart attack Mother    Diabetes Father    COPD Father    Diverticulitis Brother     Social History   Occupational History   Not  on file  Tobacco Use   Smoking status: Former    Packs/day: 2.00    Years: 50.00    Total pack years: 100.00    Types: Cigarettes    Quit date: 02/15/2021    Years since quitting: 0.7   Smokeless tobacco: Never  Substance and Sexual Activity   Alcohol use: Yes    Comment: occasional   Drug use: No   Sexual activity: Not on file     Physical Exam: Blood pressure 94/62, pulse 73, height '5\' 7"'$  (1.702 m), weight 180 lb 12.8 oz (82 kg), SpO2 97 %.  Gen:      No acute distress, barrel chested ENT:  mmm Lungs:   diminished, no wheezes or crackles CV:         RRR no mrg   Data Reviewed: Imaging: I have personally reviewed the lung windows in his ct angio head and neck - upper lobe emphysema.  PFTs:     Latest Ref Rng & Units 06/25/2021    1:12 PM  PFT Results  FVC-Pre L 2.42   FVC-Predicted Pre % 66   FVC-Post L 2.47   FVC-Predicted Post % 67   Pre FEV1/FVC % % 43   Post FEV1/FCV % % 44   FEV1-Pre L 1.03  FEV1-Predicted Pre % 39   FEV1-Post L 1.09   DLCO uncorrected ml/min/mmHg 8.28   DLCO UNC% % 36   DLCO corrected ml/min/mmHg 8.28   DLCO COR %Predicted % 36   DLVA Predicted % 44   TLC L 6.70   TLC % Predicted % 103   RV % Predicted % 153    I have personally reviewed the patient's PFTs and very severe airflow limitation and hyperinflation with air trapping and reduced dlco.   Labs:  Lab Results  Component Value Date   WBC 7.6 02/19/2021   HGB 14.0 02/19/2021   HCT 40.9 02/19/2021   MCV 89.3 02/19/2021   PLT 259 02/19/2021   Lab Results  Component Value Date   NA 139 02/24/2021   K 3.2 (L) 02/24/2021   CL 108 02/24/2021   CO2 23 02/24/2021     Immunization status: Immunization History  Administered Date(s) Administered   Fluad Quad(high Dose 65+) 01/25/2020, 03/04/2021   Influenza-Unspecified 03/18/2018, 01/25/2020   Pneumococcal Polysaccharide-23 01/25/2020    External Records Personally Reviewed: neurology   Assessment:  Gold stage IV  COPD with progression of disease FEV1 39% Chronic respiratory failure on 2LNC Recent tobacco cessation Left [place] CVA Bladder Cancer  Plan/Recommendations:  Continue and fomoterol. Will refill budesonide through DME pharmacy and see what LAMA(yupelri, lonhala) he may be able to get.   He is now agreeable to pulmonary rehab. Will refer to pulmonary rehab at [facility]   Outside the window for lung cancer screening.   Return to Care: Return in about 4 months (around 03/20/2022).   Lenice Llamas, MD Pulmonary and [place]

## 2021-11-19 ENCOUNTER — Telehealth: Payer: Self-pay

## 2021-11-19 MED ORDER — REVEFENACIN 175 MCG/3ML IN SOLN: 175.0000 ug | Freq: Every day | RESPIRATORY_TRACT | 5 refills | Status: DC

## 2021-11-19 MED ORDER — BUDESONIDE 0.25 MG/2ML IN SUSP: 0.2500 mg | Freq: Every day | RESPIRATORY_TRACT | 5 refills | Status: AC

## 2021-11-19 NOTE — Telephone Encounter (Signed)
-----   Message from Spero Geralds, MD sent at 11/17/2021  5:17 PM EDT ----- Hi pharmacy team - can you please tell me what nebulized lama (lonhala, yupelri) he would qualify for with medicare part B through his DME? Thanks!

## 2021-11-19 NOTE — Telephone Encounter (Signed)
Good morning! We can not send test claim for Part B coverage. It will have to be sent straight to DME supplier.

## 2021-11-19 NOTE — Telephone Encounter (Signed)
Yulpri and budesonide nebs sent to [facility] Nothing further needed at this time

## 2021-12-15 ENCOUNTER — Ambulatory Visit: Payer: Medicare Other | Admitting: Internal Medicine

## 2022-01-05 ENCOUNTER — Telehealth: Payer: Self-pay | Admitting: Internal Medicine

## 2022-01-05 NOTE — Telephone Encounter (Signed)
Called wife back to let her know that we are checking with the pharmacy to find an alternative for the [place]. Will call her back with update.

## 2022-01-05 NOTE — Telephone Encounter (Signed)
Were his neb medications sent through DME? The only way they'd be covered this way is if he has medicare part B.   Pharmacy does he have any other LAMA options? THx

## 2022-01-05 NOTE — Telephone Encounter (Signed)
Patient wife is calling and asking for an alternative to the [place]. She states that it is costing her $270. She states that this medication is not cost effective.   Please advise Dr Shearon Stalls

## 2022-01-05 NOTE — Telephone Encounter (Unsigned)
Called and spoke to pt's wife, Caren Griffins. She states the pt "becomes short of breath, sweaty and shaky" when he gets up to walk around WITHOUT O2. She states his SpO2 has been in the low 80s without O2 Advised Caren Griffins that the pt is suppose to be wearing 2lpm while walking, per pt's chart. I inquired about pt's nebs that were prescribed early 11/2021. Per Caren Griffins pt has not received Pulmicort nor Yupelri. [place] ([facility]) and was advised they have tried to reach out to the pt several times before shipping but have not been able to reach them.   Called Caren Griffins back and informed her of the medication update. [facility] phone number given to Caren Griffins and she will call to get meds shipped. She states she spoke with pt and he states he does not experience the above mentioned symptoms when he is on the O2 and walking. The only other complaint from pt is that he has had an increase in mucus production (clear) with his cough. Pt states he is only on the albuterol neb prn. Advised pt that the Yupelri and Pulmicort should help with the mucus and the cough. Unk Pinto to call back if there are any issues with getting the medication or pt's not feeling well. Pt verbalized understanding and denied any further questions or concerns at this time.    Will forward to Dr. Shearon Stalls as Juluis Rainier.

## 2022-01-06 ENCOUNTER — Other Ambulatory Visit (HOSPITAL_COMMUNITY): Payer: Self-pay

## 2022-01-06 NOTE — Telephone Encounter (Signed)
Caren Griffins (wife) returned call and wanted to know if these options are used for the neb machine and how they compare to albuterol

## 2022-01-06 NOTE — Telephone Encounter (Signed)
Left detailed message per DPR with the prices of alternative medications   Nothing further

## 2022-01-07 ENCOUNTER — Telehealth: Payer: Self-pay | Admitting: *Deleted

## 2022-01-07 MED ORDER — SPIRIVA RESPIMAT 2.5 MCG/ACT IN AERS: 2.0000 | INHALATION_SPRAY | Freq: Every day | RESPIRATORY_TRACT | 3 refills | Status: AC

## 2022-01-07 NOTE — Addendum Note (Signed)
Addended by: Lenice Llamas on: 01/07/2022 01:07 PM   Modules accepted: Orders

## 2022-01-07 NOTE — Telephone Encounter (Signed)
ATC patient's wife, Caren Griffins [facility]), LVM to return call.    Called mobile # and spoke with patient's wife, Caren Griffins ([place]), I provided the information per Dr. Shearon Stalls.  She stated they could afford the Pulmicort nebulizer solution.  They are unable to afford the Perforomist and Yupelri.  I advised I would look for some patient assistance for these medications and if there is assistance available I would send the paperwork in the mail to them.  She verbalized understanding.

## 2022-01-07 NOTE — Telephone Encounter (Signed)
These are maintenance medications to take once daily no matter what. Albuterol is still available as a rescue therapy option. Spiriva respimat sent to pharmacy if he wants to try it.

## 2022-01-07 NOTE — Telephone Encounter (Signed)
Patient assistance forms printed and mailed to patient for Perforomist and Yupelri.  Forms for Dr. Shearon Stalls to sign placed in her sign folder.

## 2022-01-08 ENCOUNTER — Other Ambulatory Visit: Payer: Self-pay | Admitting: Family Medicine

## 2022-02-20 ENCOUNTER — Telehealth: Payer: Self-pay | Admitting: Internal Medicine

## 2022-02-20 DIAGNOSIS — J449 Chronic obstructive pulmonary disease, unspecified: Secondary | ICD-10-CM

## 2022-02-20 NOTE — Telephone Encounter (Signed)
ATC LVMTCB x 1  

## 2022-02-23 NOTE — Telephone Encounter (Signed)
Caren Griffins wife is returning phone call. Caren Griffins phone number is [phone].

## 2022-02-24 NOTE — Telephone Encounter (Signed)
Called patient's wife but she did not answer. Left message for her to call back.  ?

## 2022-02-24 NOTE — Telephone Encounter (Signed)
Called and spoke with patient's wife. She verbalized understanding.   Nothing further needed at time of call.  

## 2022-02-24 NOTE — Addendum Note (Signed)
Addended by: Valerie Salts on: 02/24/2022 01:40 PM   Modules accepted: Orders

## 2022-02-24 NOTE — Telephone Encounter (Signed)
Ok to supply tanks

## 2022-02-24 NOTE — Telephone Encounter (Signed)
Patient's wife returned call. She stated that the patient had a portable oxygen tank with wheels when he first received O2 but [facility] came out and took the tanks back during the summer once he received the POC. He is participating in pulmonary at [facility] and feels that he does better with the continuous oxygen vs the pulsed oxygen.   She wants to know if Dr. Shearon Stalls would be willing to send an order to [facility] for them to supply the patient with the tanks again. She confirmed that he is using 2L of O2.   Dr. Shearon Stalls, can you please advise? Thanks!

## 2022-03-16 ENCOUNTER — Other Ambulatory Visit: Payer: Self-pay | Admitting: Student

## 2022-03-16 DIAGNOSIS — R42 Dizziness and giddiness: Secondary | ICD-10-CM

## 2022-04-14 ENCOUNTER — Other Ambulatory Visit: Payer: Self-pay | Admitting: Student

## 2022-04-14 DIAGNOSIS — R930 Abnormal findings on diagnostic imaging of skull and head, not elsewhere classified: Secondary | ICD-10-CM

## 2022-04-16 ENCOUNTER — Telehealth: Payer: Self-pay | Admitting: Neurology

## 2022-04-16 NOTE — Telephone Encounter (Signed)
I will send a copy of his recent CT scan report by Mr. Atkins primary care provider ([facility]).  The CT scan performed 04/03/2022 was compared to a CT scan from 03/28/2018.  In the interim, there was progression of chronic microvascular ischemic change as well as a stroke in the left pons.  Note was made of a falcine meningioma.  I concur that those changes have occurred since 2019.  I was able to also pull up a MRI from 02/18/2021 (the recent CT scan was done at [facility] under a different number and had not been compared to his most recent neuroimaging).  The pontine stroke was acute on the 2022 MRI and the meningioma was noted.  The CT scan from 04/03/2022 is fairly unchanged compared to the MRI from last year.

## 2022-04-20 NOTE — Telephone Encounter (Signed)
We received a referral from patients PCP Cipriano Mile, NP at [facility] for the results that came back on recent head CT. Is this okay to scheduled for a f/u with you?

## 2022-04-22 ENCOUNTER — Ambulatory Visit: Payer: Medicare Other | Admitting: Neurology

## 2022-04-22 ENCOUNTER — Encounter: Payer: Self-pay | Admitting: Neurology

## 2022-04-22 ENCOUNTER — Telehealth: Payer: Self-pay | Admitting: Neurology

## 2022-04-22 VITALS — BP 168/108 | HR 95 | Ht 67.0 in | Wt 178.5 lb

## 2022-04-22 DIAGNOSIS — I639 Cerebral infarction, unspecified: Secondary | ICD-10-CM | POA: Diagnosis not present

## 2022-04-22 DIAGNOSIS — R39198 Other difficulties with micturition: Secondary | ICD-10-CM

## 2022-04-22 DIAGNOSIS — R269 Unspecified abnormalities of gait and mobility: Secondary | ICD-10-CM

## 2022-04-22 DIAGNOSIS — G3184 Mild cognitive impairment, so stated: Secondary | ICD-10-CM

## 2022-04-22 DIAGNOSIS — G629 Polyneuropathy, unspecified: Secondary | ICD-10-CM

## 2022-04-22 DIAGNOSIS — G912 (Idiopathic) normal pressure hydrocephalus: Secondary | ICD-10-CM

## 2022-04-22 NOTE — Progress Notes (Signed)
[facility]  PATIENT: Wayne [name] DOB: Mar 31, 1946  REFERRING DOCTOR OR PCP: Rosalin Hawking, MD (Stroke Hospitalist); Cipriano Mile (PCP) SOURCE: Patient, notes from recent hospital admission, imaging and lab results, MRI and CT images personally reviewed.  _________________________________   HISTORICAL  CHIEF COMPLAINT:  Chief Complaint  Patient presents with   New Patient (Initial Visit)    RM 1 with Caren Griffins and Lurena Joiner. Stroke 02/2021. In wheelchair. Ambulates with cane. Takes BC daily for headache. Has poor balance, dizzy. Last fall was Monday. Falling more frequently. Last couple weeks, has fallen three times.     HISTORY OF PRESENT ILLNESS:  Wayne [name], [name]. Is a 76 y.o. man with a pontine stroke and his frequent dizziness.  Update 04/22/2022 Balance worsened after his stroke last year but has worsened further the last couple months. He also noted right leg weakness after the 2022 CVA (pontine).   He also feels weaker in his legs but not the arms.   He is using a cane .  He hs a walker but is not using it.     He also reports a lot of neck pain.    TPIs only helped one day.   He has a lot of headache and takes a BC frequently.     He was having more difficulties with reduced balance.  He has fallen a few times in the last week.  All falls are backwards.    He has fallen on stairs.     CT of the head 04/02/2022 showed progression of the chronic microvascular ischemic change compared to an image from 2019 (the study was not compared to more recent studies done in 2022) additionally it showed a meningioma that was not noted in 2019.  Of note, the meningioma had been seen on previous studies from 2022.  An addendum was reported after they had access to the 2022 imaging that there were no changes noted  Additionally, on CT scan there is ventriculomegaly out of proportion to the extent of atrophy.  Sulci are crowded at the vertex which is often seen with normal  pressure hydrocephalus.  This has mildly progressed compared to 2019.  He denies vertigo.    He was once diagnosed with Mnire's disease though that is unlikely given his symptoms.  He takes a BC powder daily for headache.   Headache is occipital and daily.   He wakes up wihout one but it buolds up as the day goes on.       Stroke History: He presented to the [facility] ED 02/18/2021 due to weakness in his right leg x 1 day and dizziness.    An MRI showed a left pontine CVA and he was admitted.   Exam by Dr. Erlinda Hong showed Right nasolabial fold flattening, mild facial droop.  Tongue midline.  Left upper extremity 5/5.  Right upper extremity proximal 5 -/5, right elbow fixed due to previous injury.  Right hand grip 5 -/5, slightly decreased finger dexterity.  Left lower extremity 5/5, right lower extremity proximal 4+/5, distal DF/PF 5 -/5.  Finger-to-nose slow around right.   Due to the lacunar infarction, he was advised to quit smoking and was placed on DAPT plavix/asa x 3 weeks then ASA 81 mg monotherapy .  He was also on Lipitor 40 mg .    Vascular risks:  Diabetes, hypertension, hyperlipidemia, smoking (was 2 ppd x many years, just quit last month.   He has type II non-insulin-dependent diabetes mellitus  and is on metformin.  He sometimes reports numbness in his toes but not above the ankles    Additionally, he has a long history of smoking and has advanced COPD.    EF 60 to 65%.  LDL 58, A1c 5.8.  Creatinine 0.80.   Imaging studies from the hospital were personally reviewed and I concur with the impression. MRi Brain 02/18/2021 showed 1. Motion degraded exam. 2. 1.2 cm acute ischemic nonhemorrhagic left pontine infarct. 3. Age-related cerebral atrophy with moderately advanced chronic microvascular ischemic disease. 4. 1.4 cm probable meningioma along the posterior falx. No associated mass effect.  CTA head/neck 02/19/2021 showed: 1. Hypodensity in the left pons consistent with evolving infarct  as seen on the prior brain MRI. No evidence of hemorrhage. 2. Patent intracranial vasculature with minimal atherosclerotic disease. Specifically, the basilar artery is widely patent. 3. Mild soft and calcified atherosclerotic plaque in the bilateral carotid bulbs without hemodynamically significant stenosis. Patent vertebral arteries. 4. Tortuosity of the right internal carotid artery may be due to hypertension. 5. Unchanged global parenchymal volume loss and chronic white matter microangiopathy. 6. Severe emphysema in the lung apices.    REVIEW OF SYSTEMS: Constitutional: No fevers, chills, sweats, or change in appetite Eyes: No visual changes, double vision, eye pain Ear, nose and throat: No hearing loss, ear pain, nasal congestion, sore throat Cardiovascular: No chest pain, palpitations Respiratory: He has shortness of breath at rest or with exertion due to COPD.   No wheezes.  He recently quit smoking. GastrointestinaI: No nausea, vomiting, diarrhea, abdominal pain, fecal incontinence Genitourinary:  No dysuria, urinary retention or frequency.  No nocturia. Musculoskeletal:  No neck pain, back pain Integumentary: No rash, pruritus, skin lesions Neurological: as above Psychiatric: No depression at this time.  No anxiety Endocrine: No palpitations, diaphoresis, change in appetite, change in weigh or increased thirst Hematologic/Lymphatic:  No anemia, purpura, petechiae. Allergic/Immunologic: No itchy/runny eyes, nasal congestion, recent allergic reactions, rashes  ALLERGIES: Allergies  Allergen Reactions   Gabapentin Other (See Comments)    Adverse reaction   Sertraline Other (See Comments)    Adverse reaction   Metformin Diarrhea and Nausea Only   Pineapple Hives   Pravastatin Other (See Comments)   Lorazepam Other (See Comments)    Shakiness    HOME MEDICATIONS:  Current Outpatient Medications:    albuterol (PROVENTIL HFA) 108 (90 Base) MCG/ACT inhaler, Inhale 2  puffs into the lungs every 6 (six) hours as needed for wheezing or shortness of breath., Disp: 54 g, Rfl: 3   albuterol (PROVENTIL) (2.5 MG/3ML) 0.083% nebulizer solution, Inhale 3 mLs (2.5 mg total) into the lungs every 6 (six) hours as needed for shortness of breath or wheezing., Disp: 75 mL, Rfl: 11   amLODipine (NORVASC) 10 MG tablet, Take 10 mg by mouth daily., Disp: , Rfl:    Aspirin-Salicylamide-Caffeine (BC HEADACHE POWDER PO), Take by mouth as needed., Disp: , Rfl:    atorvastatin (LIPITOR) 40 MG tablet, TAKE 1 TABLET BY MOUTH  DAILY (Patient taking differently: Take 40 mg by mouth daily.), Disp: 90 tablet, Rfl: 3   Blood Glucose Monitoring Suppl (ONE TOUCH ULTRA 2) w/Device KIT, Use daily to check blood sugar E11.9, Disp: 1 kit, Rfl: 0   budesonide (PULMICORT) 0.25 MG/2ML nebulizer solution, Take 2 mLs (0.25 mg total) by nebulization daily., Disp: 60 mL, Rfl: 5   eszopiclone (LUNESTA) 1 MG TABS tablet, Take 1 mg by mouth at bedtime., Disp: , Rfl:    finasteride (PROSCAR) 5 MG  tablet, Take 1 tablet (5 mg total) by mouth at bedtime., Disp: 90 tablet, Rfl: 2   fludrocortisone (FLORINEF) 0.1 MG tablet, Take 1 tablet (0.1 mg total) by mouth daily., Disp: 30 tablet, Rfl: 11   fluticasone (FLONASE) 50 MCG/ACT nasal spray, USE 2 SPRAY(S) IN EACH NOSTRIL ONCE DAILY AS NEEDED FOR ALLERGIES OR RHIITIS (Patient taking differently: Place 2 sprays into both nostrils daily as needed for allergies.), Disp: 16 g, Rfl: 0   formoterol (PERFOROMIST) 20 MCG/2ML nebulizer solution, Take 2 mLs (20 mcg total) by nebulization 2 (two) times daily., Disp: 120 mL, Rfl: 5   glucose blood test strip, Use new test strip each time when checking blood sugar, Disp: 100 each, Rfl: 2   metFORMIN (GLUCOPHAGE) 500 MG tablet, Take 500 mg by mouth daily., Disp: , Rfl:    omeprazole (PRILOSEC) 40 MG capsule, TAKE 1 CAPSULE BY MOUTH  DAILY (Patient taking differently: Take 40 mg by mouth daily.), Disp: 90 capsule, Rfl: 3   OneTouch  Delica Lancets 16W MISC, Use a new lancet each time when checking blood sugar, Disp: 100 each, Rfl: 2   pyridostigmine (MESTINON) 60 MG tablet, Take 0.5 tablets (30 mg total) by mouth 3 (three) times daily., Disp: 45 tablet, Rfl: 5   sildenafil (VIAGRA) 100 MG tablet, TAKE 1 TABLET BY MOUTH AS NEEDED APPROXIMATELY 1 HOUR PRIOR TO SEXUAL ACTIVITY (Patient taking differently: Take 100 mg by mouth as needed for erectile dysfunction.), Disp: 10 tablet, Rfl: 2   tamsulosin (FLOMAX) 0.4 MG CAPS capsule, TAKE 1 CAPSULE BY MOUTH  DAILY, Disp: 90 capsule, Rfl: 3   Tiotropium Bromide Monohydrate (SPIRIVA RESPIMAT) 2.5 MCG/ACT AERS, Inhale 2 puffs into the lungs daily., Disp: 1 each, Rfl: 3   topiramate (TOPAMAX) 25 MG tablet, Take 75 mg by mouth at bedtime., Disp: , Rfl:   PAST MEDICAL HISTORY: Past Medical History:  Diagnosis Date   BPH (benign prostatic hyperplasia)    COPD (chronic obstructive pulmonary disease) ([place])    Diabetes mellitus without complication (HCC)    GERD (gastroesophageal reflux disease)    Hypertension    Male erectile disorder    Meniere's disease    Mixed hyperlipidemia    Nontoxic single thyroid nodule    Somnolence    Thoracic aortic aneurysm (HCC)    per CT chest 08/29/20 in Epic - stable diffuse aneurysmal dilation of the descending thoracic aorta with associated significant atheroschlerosis. 3.8 cm in maximum diameter.    PAST SURGICAL HISTORY: Past Surgical History:  Procedure Laterality Date   CATARACT EXTRACTION Bilateral    ELBOW SURGERY Right     FAMILY HISTORY: Family History  Problem Relation Age of Onset   Heart attack Mother    Diabetes Father    COPD Father    Diverticulitis Brother     SOCIAL HISTORY:  Social History   Socioeconomic History   Marital status: Married    Spouse name: Caren Griffins   Number of children: 3   Years of education: 12   Highest education level: Not on file  Occupational History   Not on file  Tobacco Use   Smoking  status: Former    Packs/day: 2.00    Years: 50.00    Total pack years: 100.00    Types: Cigarettes    Quit date: 02/15/2021    Years since quitting: 1.1   Smokeless tobacco: Never  Substance and Sexual Activity   Alcohol use: Yes    Comment: occasional   Drug use: No  Sexual activity: Not on file  Other Topics Concern   Not on file  Social History Narrative   Left handed   Caffeine use: 1-2 cup per day   Social Determinants of Health   Financial Resource Strain: Not on file  Food Insecurity: Not on file  Transportation Needs: Not on file  Physical Activity: Not on file  Stress: Not on file  Social Connections: Not on file  Intimate Partner Violence: Not on file     PHYSICAL EXAM  Vitals:   04/22/22 0839  BP: (!) 168/108  Pulse: 95  Weight: 178 lb 8 oz (81 kg)  Height: _0  (1.702 m)    Body mass index is 27.96 kg/m.    General: The patient is well-developed and well-nourished and in no acute distress  HEENT:  Head is [place]/AT.  Sclera are anicteric.     Neck: No carotid bruits are noted.  The neck is tender over the occiput bilaterally, right much more than left.  He has milder tenderness in the mid to lower cervical paraspinal muscles.  Skin: Extremities are without rash or  edema.  Musculoskeletal:  Back is nontender  Neurologic Exam  Mental status: The patient is alert and oriented x 3 at the time of the examination. The patient has apparent normal recent and remote memory, with an apparently normal attention span and concentration ability.   Speech is normal.  Cranial nerves: Extraocular movements are full.   There is good facial sensation to soft touch bilaterally.Facial strength is normal.  Trapezius and sternocleidomastoid strength is normal. No dysarthria is noted.  The tongue is midline, and the patient has symmetric elevation of the soft palate.  He has hearing deficits  Motor:  Muscle bulk is normal.   Tone is normal. Strength is  5 / 5 in all 4  extremities except 4+/5 right iliopsoas and right toe extension.  4+ right ankle extension.  Sensory: Sensory testing is intact to pinprick, soft touch and vibration sensation in arms he has reduced vibration sensation at the ankles, worse vibration sensation at the toes.  Coordination: Cerebellar testing reveals good finger-nose-finger and heel-to-shin bilaterally.  Gait and station: Station is normal.  He has a shuffling gait, and poor tandem.  He has retropulsion.  Romberg is negative.   Reflexes: Deep tendon reflexes are symmetric and normal bilaterally.       DIAGNOSTIC DATA (LABS, IMAGING, TESTING) - I reviewed patient records, labs, notes, testing and imaging myself where available.  Lab Results  Component Value Date   WBC 7.6 02/19/2021   HGB 14.0 02/19/2021   HCT 40.9 02/19/2021   MCV 89.3 02/19/2021   PLT 259 02/19/2021      Component Value Date/Time   NA 139 02/24/2021 0312   NA 141 08/13/2020 0957   K 3.2 (L) 02/24/2021 0312   CL 108 02/24/2021 0312   CO2 23 02/24/2021 0312   GLUCOSE 110 (H) 02/24/2021 0312   BUN 12 02/24/2021 0312   BUN 10 08/13/2020 0957   CREATININE 1.04 02/24/2021 0312   CALCIUM 8.6 (L) 02/24/2021 0312   PROT 6.9 02/18/2021 1221   PROT 7.3 08/13/2020 0957   ALBUMIN 3.7 02/18/2021 1221   ALBUMIN 4.4 08/13/2020 0957   AST 13 (L) 02/18/2021 1221   ALT 13 02/18/2021 1221   ALKPHOS 58 02/18/2021 1221   BILITOT 1.0 02/18/2021 1221   BILITOT 0.6 08/13/2020 0957   GFRNONAA >60 02/24/2021 0312   GFRAA 88 01/25/2020 1133  Lab Results  Component Value Date   CHOL 106 02/19/2021   HDL 32 (L) 02/19/2021   LDLCALC 58 02/19/2021   LDLDIRECT 170.3 07/02/2006   TRIG 81 02/19/2021   CHOLHDL 3.3 02/19/2021   Lab Results  Component Value Date   HGBA1C 5.8 (H) 02/19/2021   Lab Results  Component Value Date   VITAMINB12 491 09/18/2020   Lab Results  Component Value Date   TSH 2.540 10/20/2016       ASSESSMENT AND PLAN  Gait  disturbance - Plan: DG FL GUIDED LUMBAR PUNCTURE  Polyneuropathy - Plan: Multiple Myeloma Panel (SPEP&IFE w/QIG), Vitamin B12  Normal pressure hydrocephalus (HCC)  Left pontine CVA (HCC)  Mild cognitive impairment  Urinary dysfunction   For the stroke, he will continue to take aspirin.  He can substitute BC powder since they contain ASA. Although gait issues are likely multifactorial, recent worsening with shuffling gait and the appearance of the imaging studies is concerning for normal pressure hydrocephalus.  We discussed the pros and cons of further evaluation with a high-volume lumbar puncture and referral to neurosurgery if he gets a significant benefit in the short-term.  He would like to proceed with a lumbar puncture and we will schedule this. he has mild neuropathy, likely due to the diabetes.  This does not appear to be severe enough to be causing his gait issues that might be an additional contributor additionally it could be playing a role in his orthostatic hypotension that has improved with treatment  He will return to see me in 4 months or sooner if there are new or worsening neurologic symptoms.  45-minute office visit with the majority of the time spent face-to-face for history and physical, discussion/counseling and decision-making.  Additional time with record review and documentation.   Geniyah Eischeid A. Felecia Shelling, MD, [facility] 07/19/9177, 1:50 PM Certified in Neurology, Clinical Neurophysiology, Sleep Medicine and Neuroimaging  [facility] [address], [place], [place] [address] [phone]

## 2022-04-22 NOTE — Telephone Encounter (Signed)
Pt stated that Dr. Felecia Shelling gave them the option of having a lumbar puncture performed, but was indecisive about this during the visit. Pt stated at check out that they would like to have this done.

## 2022-04-25 LAB — MULTIPLE MYELOMA PANEL, SERUM
Albumin SerPl Elph-Mcnc: 3.7 g/dL (ref 2.9–4.4)
Albumin/Glob SerPl: 1.3 (ref 0.7–1.7)
Alpha 1: 0.2 g/dL (ref 0.0–0.4)
Alpha2 Glob SerPl Elph-Mcnc: 0.7 g/dL (ref 0.4–1.0)
B-Globulin SerPl Elph-Mcnc: 1 g/dL (ref 0.7–1.3)
Gamma Glob SerPl Elph-Mcnc: 0.9 g/dL (ref 0.4–1.8)
Globulin, Total: 2.9 g/dL (ref 2.2–3.9)
IgA/Immunoglobulin A, Serum: 241 mg/dL (ref 61–437)
IgG (Immunoglobin G), Serum: 918 mg/dL (ref 603–1613)
IgM (Immunoglobulin M), Srm: 85 mg/dL (ref 15–143)
Total Protein: 6.6 g/dL (ref 6.0–8.5)

## 2022-04-25 LAB — VITAMIN B12: Vitamin B-12: 292 pg/mL (ref 232–1245)

## 2022-04-27 ENCOUNTER — Telehealth: Payer: Self-pay | Admitting: *Deleted

## 2022-04-27 NOTE — Telephone Encounter (Signed)
-----   Message from Britt Bottom, MD sent at 04/26/2022  2:04 PM EST ----- Please let the patient know that the lab work is fine.

## 2022-04-27 NOTE — Telephone Encounter (Signed)
Called spoke with wife about lab results per Dr. Felecia Shelling note. She verbalized understanding. She states they have not heard about scheduled LP. I reviewed notes. I relayed there is a note that they were waiting for him to get MRI brain first. She states he is not having this and Dr. Felecia Shelling aware. I recommended she call  [facility] back to schedule LP and let them know this.

## 2022-05-06 ENCOUNTER — Ambulatory Visit
Admission: RE | Admit: 2022-05-06 | Discharge: 2022-05-06 | Disposition: A | Discharge status: Home / Self Care | Payer: Medicare Other | Source: Ambulatory Visit | Attending: Neurology | Admitting: Neurology

## 2022-05-06 VITALS — BP 130/77 | HR 70

## 2022-05-06 DIAGNOSIS — R269 Unspecified abnormalities of gait and mobility: Secondary | ICD-10-CM

## 2022-05-06 DIAGNOSIS — E782 Mixed hyperlipidemia: Secondary | ICD-10-CM

## 2022-05-06 DIAGNOSIS — G4489 Other headache syndrome: Secondary | ICD-10-CM

## 2022-05-06 LAB — CSF CELL COUNT WITH DIFFERENTIAL
RBC Count, CSF: 0 cells/uL
TOTAL NUCLEATED CELL: 0 cells/uL (ref 0–5)

## 2022-05-06 LAB — PROTEIN, CSF: Total Protein, CSF: 58 mg/dL (ref 15–60)

## 2022-05-06 LAB — GLUCOSE, CSF: Glucose, CSF: 75 mg/dL (ref 40–80)

## 2022-05-06 NOTE — Discharge Instructions (Signed)

## 2022-05-07 ENCOUNTER — Telehealth: Payer: Self-pay | Admitting: *Deleted

## 2022-05-07 NOTE — Telephone Encounter (Signed)
LVM for pt to call office

## 2022-05-07 NOTE — Telephone Encounter (Signed)
-----   Message from Britt Bottom, MD sent at 05/06/2022  5:55 PM EST ----- He had a high-volume spinal tap 05/06/2022 for suspected normal pressure hydrocephalus. Please check with the patient on Thursday to see if he is noting any benefit.Marland Kitchen

## 2022-05-08 ENCOUNTER — Telehealth: Payer: Self-pay | Admitting: Neurology

## 2022-05-08 NOTE — Telephone Encounter (Signed)
I spoke to [name].  Wayne had a high-volume lumbar puncture a couple days ago.  He felt that there was some improvement in his gait.  Additionally he feels his neck pain is a little better.  I discussed referral to neurosurgery for a VP shunt.  Fortunately, Mr. Wayne [name] has severe COPD and he has been told that he is not a good surgical candidate.  Therefore, even though I believe that normal pressure hydrocephalus is playing a role in his gait issues, I will hold off on referral to neurosurgery.  His gait issues are likely multifactorial.  I would like to get an MRI of the cervical spine.  However, he is claustrophobic and does not want to do an MRI.  I discussed with them that this would not only allow [place] to determine if there is any pressure on the spinal cord but also assess the degenerative changes to see if he might be a candidate for epidural steroid or other procedure (which he may be able to do as he would not need to be put under general)  They are going to call to make an appointment to see me in about a month so we can discuss these issues further.

## 2022-06-24 ENCOUNTER — Encounter: Payer: Self-pay | Admitting: Neurology

## 2022-06-24 ENCOUNTER — Ambulatory Visit: Payer: Medicare Other | Admitting: Neurology

## 2022-06-24 VITALS — BP 113/75 | HR 65 | Ht 67.0 in | Wt 170.8 lb

## 2022-06-24 DIAGNOSIS — I951 Orthostatic hypotension: Secondary | ICD-10-CM | POA: Diagnosis not present

## 2022-06-24 DIAGNOSIS — I639 Cerebral infarction, unspecified: Secondary | ICD-10-CM | POA: Diagnosis not present

## 2022-06-24 DIAGNOSIS — R269 Unspecified abnormalities of gait and mobility: Secondary | ICD-10-CM

## 2022-06-24 DIAGNOSIS — G912 (Idiopathic) normal pressure hydrocephalus: Secondary | ICD-10-CM | POA: Diagnosis not present

## 2022-06-24 DIAGNOSIS — E1142 Type 2 diabetes mellitus with diabetic polyneuropathy: Secondary | ICD-10-CM

## 2022-06-24 NOTE — Progress Notes (Signed)
[facility]  PATIENT: Wayne [name] DOB: 01-06-1946  REFERRING DOCTOR OR PCP: Rosalin Hawking, MD (Stroke Hospitalist); Cipriano Mile (PCP) SOURCE: Patient, notes from recent hospital admission, imaging and lab results, MRI and CT images personally reviewed.  _________________________________   HISTORICAL  CHIEF COMPLAINT:  Chief Complaint  Patient presents with   Follow-up    Pt in room 10 here in room with wife. Here for follow up gait disturbance.. pt reports 10 falls this week, walks with cane, in wheelchair while in room. Pt reports having bad dreams asked if related to medications. Pt reports bilateral leg weakness. Wife request a wheelchair for pt.     HISTORY OF PRESENT ILLNESS:  Wayne [name], [name]. Is a 77 y.o. man with a pontine stroke and his frequent dizziness.  Update 06/23/2022 He had a high volume LP in December and  he felt gait improved for the rest of that day and he mowed the yard that day.  He was pretty much back to the new baseline afterwards.  Unfortunately, he has severe COPD and has been told he is high risk for surgery.   We discussed there is no oral medication that could help.    I could refer to a surgeon for an opinion form neurosurgeon but the risk may be felt to be too much risk.    I do not know the level of anesthesia necessary for VP shunt.  Because he has been told he shouldn't have surgery, he prefers not to have a surgical opinion.   His urologist had wanted to set up a biopsy under anesthesia but cardiology and pulmonology  felt the going under anesthesia would be too dangerous  Balance worsened after his stroke last year but has worsened further the last couple months. He also noted right leg weakness after the 2022 CVA (pontine).   He also feels weaker in his legs but not the arms.   He is using a cane .  He hs a walker but is not using it.     He also reports a lot of neck pain.    TPIs only helped one day.   He has a lot of  headache and takes a BC frequently.     He was having more difficulties with reduced balance.  He has fallen a few times in the last week.  All falls are backwards.    He has fallen on stairs.     He has very vivid dreams.  He talks in his sleep but does not kick/pinch/scream.   He had hallucinations on Lunesta.   He has not bene on a benzo.     He falls asleep better more recently than he used to.      He denies vertigo.    He was once diagnosed with Mnire's disease though that is unlikely given his symptoms.  He takes a BC powder daily for headache.   Headache is occipital and daily.   He wakes up wihout one but it buolds up as the day goes on.      CT of the head 04/02/2022 showed progression of the chronic microvascular ischemic change compared to an image from 2019 (the study was not compared to more recent studies done in 2022) additionally it showed a meningioma that was not noted in 2019.  Of note, the meningioma had been seen on previous studies from 2022.  An addendum was reported after they had access to the 2022 imaging that  there were no changes noted       Additionally, on CT scan there is ventriculomegaly out of proportion to the extent of atrophy.  Sulci are crowded at the vertex which is often seen with normal pressure hydrocephalus.  This has mildly progressed compared to 2019.  Stroke History: He presented to the [facility] ED 02/18/2021 due to weakness in his right leg x 1 day and dizziness.    An MRI showed a left pontine CVA and he was admitted.   Exam by Dr. Erlinda Hong showed Right nasolabial fold flattening, mild facial droop.  Tongue midline.  Left upper extremity 5/5.  Right upper extremity proximal 5 -/5, right elbow fixed due to previous injury.  Right hand grip 5 -/5, slightly decreased finger dexterity.  Left lower extremity 5/5, right lower extremity proximal 4+/5, distal DF/PF 5 -/5.  Finger-to-nose slow around right.   Due to the lacunar infarction, he was advised to quit  smoking and was placed on DAPT plavix/asa x 3 weeks then ASA 81 mg monotherapy .  He was also on Lipitor 40 mg .    Vascular risks:  Diabetes, hypertension, hyperlipidemia, smoking (was 2 ppd x many years, just quit last month.   He has type II non-insulin-dependent diabetes mellitus and is on metformin.  He sometimes reports numbness in his toes but not above the ankles    Additionally, he has a long history of smoking and has advanced COPD.    EF 60 to 65%.  LDL 58, A1c 5.8.  Creatinine 0.80.   Imaging studies from the hospital were personally reviewed and I concur with the impression. MRi Brain 02/18/2021 showed 1. Motion degraded exam. 2. 1.2 cm acute ischemic nonhemorrhagic left pontine infarct. 3. Age-related cerebral atrophy with moderately advanced chronic microvascular ischemic disease. 4. 1.4 cm probable meningioma along the posterior falx. No associated mass effect.  CTA head/neck 02/19/2021 showed: 1. Hypodensity in the left pons consistent with evolving infarct as seen on the prior brain MRI. No evidence of hemorrhage. 2. Patent intracranial vasculature with minimal atherosclerotic disease. Specifically, the basilar artery is widely patent. 3. Mild soft and calcified atherosclerotic plaque in the bilateral carotid bulbs without hemodynamically significant stenosis. Patent vertebral arteries. 4. Tortuosity of the right internal carotid artery may be due to hypertension. 5. Unchanged global parenchymal volume loss and chronic white matter microangiopathy. 6. Severe emphysema in the lung apices.    REVIEW OF SYSTEMS: Constitutional: No fevers, chills, sweats, or change in appetite Eyes: No visual changes, double vision, eye pain Ear, nose and throat: No hearing loss, ear pain, nasal congestion, sore throat Cardiovascular: No chest pain, palpitations Respiratory: He has shortness of breath at rest or with exertion due to COPD.   No wheezes.  He recently quit  smoking. GastrointestinaI: No nausea, vomiting, diarrhea, abdominal pain, fecal incontinence Genitourinary:  No dysuria, urinary retention or frequency.  No nocturia. Musculoskeletal:  No neck pain, back pain Integumentary: No rash, pruritus, skin lesions Neurological: as above Psychiatric: No depression at this time.  No anxiety Endocrine: No palpitations, diaphoresis, change in appetite, change in weigh or increased thirst Hematologic/Lymphatic:  No anemia, purpura, petechiae. Allergic/Immunologic: No itchy/runny eyes, nasal congestion, recent allergic reactions, rashes  ALLERGIES: Allergies  Allergen Reactions   Gabapentin Other (See Comments)    Adverse reaction   Sertraline Other (See Comments)    Adverse reaction   Metformin Diarrhea and Nausea Only   Pineapple Hives   Pravastatin Other (See Comments)   Lorazepam  Other (See Comments)    Shakiness    HOME MEDICATIONS:  Current Outpatient Medications:    albuterol (PROVENTIL HFA) 108 (90 Base) MCG/ACT inhaler, Inhale 2 puffs into the lungs every 6 (six) hours as needed for wheezing or shortness of breath., Disp: 54 g, Rfl: 3   albuterol (PROVENTIL) (2.5 MG/3ML) 0.083% nebulizer solution, Inhale 3 mLs (2.5 mg total) into the lungs every 6 (six) hours as needed for shortness of breath or wheezing., Disp: 75 mL, Rfl: 11   amLODipine (NORVASC) 10 MG tablet, Take 10 mg by mouth daily., Disp: , Rfl:    Aspirin-Salicylamide-Caffeine (BC HEADACHE POWDER PO), Take by mouth as needed., Disp: , Rfl:    atorvastatin (LIPITOR) 40 MG tablet, TAKE 1 TABLET BY MOUTH  DAILY (Patient taking differently: Take 40 mg by mouth daily.), Disp: 90 tablet, Rfl: 3   Blood Glucose Monitoring Suppl (ONE TOUCH ULTRA 2) w/Device KIT, Use daily to check blood sugar E11.9, Disp: 1 kit, Rfl: 0   budesonide (PULMICORT) 0.25 MG/2ML nebulizer solution, Take 2 mLs (0.25 mg total) by nebulization daily., Disp: 60 mL, Rfl: 5   eszopiclone (LUNESTA) 1 MG TABS tablet,  Take 1 mg by mouth at bedtime., Disp: , Rfl:    finasteride (PROSCAR) 5 MG tablet, Take 1 tablet (5 mg total) by mouth at bedtime., Disp: 90 tablet, Rfl: 2   fludrocortisone (FLORINEF) 0.1 MG tablet, Take 1 tablet (0.1 mg total) by mouth daily., Disp: 30 tablet, Rfl: 11   fluticasone (FLONASE) 50 MCG/ACT nasal spray, USE 2 SPRAY(S) IN EACH NOSTRIL ONCE DAILY AS NEEDED FOR ALLERGIES OR RHIITIS (Patient taking differently: Place 2 sprays into both nostrils daily as needed for allergies.), Disp: 16 g, Rfl: 0   formoterol (PERFOROMIST) 20 MCG/2ML nebulizer solution, Take 2 mLs (20 mcg total) by nebulization 2 (two) times daily., Disp: 120 mL, Rfl: 5   glucose blood test strip, Use new test strip each time when checking blood sugar, Disp: 100 each, Rfl: 2   metFORMIN (GLUCOPHAGE) 500 MG tablet, Take 500 mg by mouth daily., Disp: , Rfl:    omeprazole (PRILOSEC) 40 MG capsule, TAKE 1 CAPSULE BY MOUTH  DAILY (Patient taking differently: Take 40 mg by mouth daily.), Disp: 90 capsule, Rfl: 3   OneTouch Delica Lancets 17P MISC, Use a new lancet each time when checking blood sugar, Disp: 100 each, Rfl: 2   pyridostigmine (MESTINON) 60 MG tablet, Take 0.5 tablets (30 mg total) by mouth 3 (three) times daily., Disp: 45 tablet, Rfl: 5   sildenafil (VIAGRA) 100 MG tablet, TAKE 1 TABLET BY MOUTH AS NEEDED APPROXIMATELY 1 HOUR PRIOR TO SEXUAL ACTIVITY (Patient taking differently: Take 100 mg by mouth as needed for erectile dysfunction.), Disp: 10 tablet, Rfl: 2   tamsulosin (FLOMAX) 0.4 MG CAPS capsule, TAKE 1 CAPSULE BY MOUTH  DAILY, Disp: 90 capsule, Rfl: 3   Tiotropium Bromide Monohydrate (SPIRIVA RESPIMAT) 2.5 MCG/ACT AERS, Inhale 2 puffs into the lungs daily., Disp: 1 each, Rfl: 3   topiramate (TOPAMAX) 25 MG tablet, Take 75 mg by mouth at bedtime., Disp: , Rfl:   PAST MEDICAL HISTORY: Past Medical History:  Diagnosis Date   BPH (benign prostatic hyperplasia)    COPD (chronic obstructive pulmonary disease)  ([place])    Diabetes mellitus without complication (HCC)    GERD (gastroesophageal reflux disease)    Hypertension    Male erectile disorder    Meniere's disease    Mixed hyperlipidemia    Nontoxic single thyroid nodule  Somnolence    Thoracic aortic aneurysm (HCC)    per CT chest 08/29/20 in Epic - stable diffuse aneurysmal dilation of the descending thoracic aorta with associated significant atheroschlerosis. 3.8 cm in maximum diameter.    PAST SURGICAL HISTORY: Past Surgical History:  Procedure Laterality Date   CATARACT EXTRACTION Bilateral    ELBOW SURGERY Right     FAMILY HISTORY: Family History  Problem Relation Age of Onset   Heart attack Mother    Diabetes Father    COPD Father    Diverticulitis Brother     SOCIAL HISTORY:  Social History   Socioeconomic History   Marital status: Married    Spouse name: Caren Griffins   Number of children: 3   Years of education: 12   Highest education level: Not on file  Occupational History   Not on file  Tobacco Use   Smoking status: Former    Packs/day: 2.00    Years: 50.00    Total pack years: 100.00    Types: Cigarettes    Quit date: 02/15/2021    Years since quitting: 1.3   Smokeless tobacco: Never  Substance and Sexual Activity   Alcohol use: Yes    Comment: occasional   Drug use: No   Sexual activity: Not on file  Other Topics Concern   Not on file  Social History Narrative   Left handed   Caffeine use: 1-2 cup per day   Social Determinants of Health   Financial Resource Strain: Not on file  Food Insecurity: Not on file  Transportation Needs: Not on file  Physical Activity: Not on file  Stress: Not on file  Social Connections: Not on file  Intimate Partner Violence: Not on file     PHYSICAL EXAM  Vitals:   06/24/22 1502  BP: 113/75  Pulse: 65  Weight: 170 lb 12.8 oz (77.5 kg)  Height: '5\' 7"'$  (1.702 m)    Body mass index is 26.75 kg/m.    General: The patient is well-developed and  well-nourished and in no acute distress  HEENT:  Head is [place]/AT.  Sclera are anicteric.     Neck: No carotid bruits are noted.  He has mild tenderness in the occiput and cervical paraspinal muscles.  Range of motion is mildly reduced.  Skin: Extremities are without rash or  edema.  Musculoskeletal:  Back is nontender  Neurologic Exam  Mental status: The patient is alert and oriented x 3 at the time of the examination. The patient has apparent normal recent and remote memory, with an apparently normal attention span and concentration ability.   Speech is normal.  Cranial nerves: Extraocular movements are full.   There is good facial sensation to soft touch bilaterally.Facial strength is normal.  Trapezius and sternocleidomastoid strength is normal. No dysarthria is noted.  The tongue is midline, and the patient has symmetric elevation of the soft palate.  He has hearing deficits  Motor:  Muscle bulk is normal.   Tone is normal. Strength is  5 / 5 in all 4 extremities except 4+/5 right iliopsoas and right toe extension.  4+ right ankle extension.  Sensory: Sensory testing is intact to pinprick, soft touch and vibration sensation in arms he has reduced vibration sensation at the ankles, worse vibration sensation at the toes.  Coordination: Cerebellar testing reveals good finger-nose-finger and heel-to-shin bilaterally.  Gait and station: Station is normal once up though he needs assistance or use of both arms to stand up.  He has  a shuffling gait, and cannot tandem.    Romberg is negative.   Reflexes: Deep tendon reflexes are symmetric and normal bilaterally.       DIAGNOSTIC DATA (LABS, IMAGING, TESTING) - I reviewed patient records, labs, notes, testing and imaging myself where available.  Lab Results  Component Value Date   WBC 7.6 02/19/2021   HGB 14.0 02/19/2021   HCT 40.9 02/19/2021   MCV 89.3 02/19/2021   PLT 259 02/19/2021      Component Value Date/Time   NA 139  02/24/2021 0312   NA 141 08/13/2020 0957   K 3.2 (L) 02/24/2021 0312   CL 108 02/24/2021 0312   CO2 23 02/24/2021 0312   GLUCOSE 110 (H) 02/24/2021 0312   BUN 12 02/24/2021 0312   BUN 10 08/13/2020 0957   CREATININE 1.04 02/24/2021 0312   CALCIUM 8.6 (L) 02/24/2021 0312   PROT 6.6 04/22/2022 0943   ALBUMIN 3.7 02/18/2021 1221   ALBUMIN 4.4 08/13/2020 0957   AST 13 (L) 02/18/2021 1221   ALT 13 02/18/2021 1221   ALKPHOS 58 02/18/2021 1221   BILITOT 1.0 02/18/2021 1221   BILITOT 0.6 08/13/2020 0957   GFRNONAA >60 02/24/2021 0312   GFRAA 88 01/25/2020 1133   Lab Results  Component Value Date   CHOL 106 02/19/2021   HDL 32 (L) 02/19/2021   LDLCALC 58 02/19/2021   LDLDIRECT 170.3 07/02/2006   TRIG 81 02/19/2021   CHOLHDL 3.3 02/19/2021   Lab Results  Component Value Date   HGBA1C 5.8 (H) 02/19/2021   Lab Results  Component Value Date   VITAMINB12 292 04/22/2022   Lab Results  Component Value Date   TSH 2.540 10/20/2016       ASSESSMENT AND PLAN  Normal pressure hydrocephalus ([place]) - Plan: For home use only DME lightweight manual wheelchair with seat cushion  Gait disturbance - Plan: For home use only DME lightweight manual wheelchair with seat cushion  Left pontine CVA ([place]) - Plan: For home use only DME lightweight manual wheelchair with seat cushion  Orthostatic hypotension  Diabetic polyneuropathy associated with type 2 diabetes mellitus ([place])   For the stroke, he will continue to take aspirin.  He can substitute BC powder since they contain ASA. He had a short-term benefit from the high-volume lumbar puncture being able to accomplish more that day than usual.  We had discussed referral to neurosurgery but he reports being told by his pulmonologist and cardiologist that another operation was too risky and that he should not have it.  I do not know the level of anesthesia necessary for VP shunt.  He prefers not to have a neurosurgical evaluation at this time  but might consider it if symptoms worsen. Lightweight wheelchair for safety inside his house Orthostatic hypotension has done better with fludrocortisone treatment He will return to see me in 4 months or sooner if there are new or worsening neurologic symptoms.   Janari Yamada A. Felecia Shelling, MD, [facility] 06/24/3783, 8:85 PM Certified in Neurology, Clinical Neurophysiology, Sleep Medicine and Neuroimaging  [facility] [address], [place], [place] [address] [phone]

## 2022-07-20 ENCOUNTER — Other Ambulatory Visit: Payer: Self-pay | Admitting: Neurology

## 2022-07-20 ENCOUNTER — Telehealth: Payer: Self-pay | Admitting: Neurology

## 2022-07-20 DIAGNOSIS — G912 (Idiopathic) normal pressure hydrocephalus: Secondary | ICD-10-CM

## 2022-07-20 DIAGNOSIS — R269 Unspecified abnormalities of gait and mobility: Secondary | ICD-10-CM

## 2022-07-20 NOTE — Telephone Encounter (Signed)
Spoke with patient wife and informed her with the below    Please let them know that I went ahead and ordered another high-volume lumbar puncture (the procedure he had a few months ago).  Please ask them to call or message [place] the next day to let us know if there was a significant improvement.

## 2022-07-20 NOTE — Telephone Encounter (Signed)
Pt's wife is asking if Dr Felecia Shelling will refer pt back to where he had an injection in his spine.  Wife is asking for a call to discuss wanting that facility to work on pt again.

## 2022-07-21 ENCOUNTER — Telehealth: Payer: Self-pay | Admitting: Neurology

## 2022-07-21 ENCOUNTER — Other Ambulatory Visit: Payer: Self-pay | Admitting: Neurology

## 2022-07-21 DIAGNOSIS — M48062 Spinal stenosis, lumbar region with neurogenic claudication: Secondary | ICD-10-CM

## 2022-07-21 NOTE — Telephone Encounter (Signed)
See telephone note on 07/20/22 regarding this call.

## 2022-07-21 NOTE — Telephone Encounter (Signed)
Dr.Sater Wayne [name] called back today stating that "high-volume lumbar puncture" is not want she was asking about. Wayne [name] wanted to know if patient can have a injection in his back to see if that would help with the back pain? Pt wife said she spoke with [facility] imaging about the high-volume lumbar puncture and she explained to them about the injection for back pain and they would need another order. Wife is wonder if pt gets the back injection he maybe able to walk better, states in wheelchair mainly.  Please advise

## 2022-07-21 NOTE — Telephone Encounter (Signed)
Pt wife called stated she wants to talk to nurse about pt getting infection in his [place]. She is requesting a call back from nurse.

## 2022-07-21 NOTE — Telephone Encounter (Signed)
Spoke with patient wife and informed her that order has been placed.

## 2022-07-28 ENCOUNTER — Telehealth: Payer: Self-pay | Admitting: Neurology

## 2022-07-28 ENCOUNTER — Ambulatory Visit
Admission: RE | Admit: 2022-07-28 | Discharge: 2022-07-28 | Disposition: A | Discharge status: Home / Self Care | Payer: Medicare Other | Source: Ambulatory Visit | Attending: Neurology | Admitting: Neurology

## 2022-07-28 DIAGNOSIS — M48062 Spinal stenosis, lumbar region with neurogenic claudication: Secondary | ICD-10-CM

## 2022-07-28 DIAGNOSIS — S0990XA Unspecified injury of head, initial encounter: Secondary | ICD-10-CM

## 2022-07-28 MED ORDER — IOPAMIDOL (ISOVUE-M 200) INJECTION 41%
1.0000 mL | Freq: Once | INTRAMUSCULAR | Status: AC
  Administered 2022-07-28: 1 mL via EPIDURAL

## 2022-07-28 MED ORDER — METHYLPREDNISOLONE ACETATE 40 MG/ML INJ SUSP (RADIOLOG
80.0000 mg | Freq: Once | INTRAMUSCULAR | Status: AC
  Administered 2022-07-28: 80 mg via EPIDURAL

## 2022-07-28 NOTE — Discharge Instructions (Signed)

## 2022-07-28 NOTE — Telephone Encounter (Signed)
Pt's wife called stating that the pt fell and hit his head. They took him to urgent care where he had xray's done. It states pt has a fractured skull and son will be dropping off the copy for medical records. Wife would like to receive a call back when provider reads the xray.

## 2022-07-29 NOTE — Telephone Encounter (Signed)
Pt's wife called to see if the x ray had been reviewed by Dr Felecia Shelling.  The message from Dr Felecia Shelling was read to wife, she is asking if the order can be sent to [facility] in [place]. Wife asking that she be called for questions or updates only at [phone]

## 2022-07-29 NOTE — Telephone Encounter (Signed)
[employer] medicare NPR sent to [facility]. [phone]

## 2022-07-29 NOTE — Telephone Encounter (Signed)
Spoke w/ Dr. Felecia Shelling, he would like CT head WO ordered. Placed order in EPIC.

## 2022-07-29 NOTE — Telephone Encounter (Signed)
Just received, placed in your office for review.

## 2022-07-29 NOTE — Telephone Encounter (Signed)
Wayne [name], can you help get pt set up asap? Placed stat per MD request

## 2022-08-05 ENCOUNTER — Other Ambulatory Visit: Payer: Self-pay | Admitting: Neurology

## 2022-08-05 DIAGNOSIS — M5416 Radiculopathy, lumbar region: Secondary | ICD-10-CM

## 2022-08-05 NOTE — Telephone Encounter (Signed)
Tori, will [facility] call to schedule or do you have to send order?  Called wife and let her know Dr. Felecia Shelling ordered another [facility] for husband. They will be contacted to schedule this. She verbalized understanding.

## 2022-08-05 NOTE — Telephone Encounter (Signed)
Pt's wife called wanting to know when they will be called back with the Results. Please advise.

## 2022-08-05 NOTE — Telephone Encounter (Signed)
Report came via fax. Placed in your review bin for you to review. Thank you.

## 2022-08-05 NOTE — Telephone Encounter (Signed)
Called wife back and spoke with wife. Relayed results per Dr. Garth Bigness note. She verbalized understanding.   She reported he completed ESI 07/28/22 at [facility] imaging and felt it was beneficial and wants repeat. [facility] told him he could repeat in 2 wk if it helped. Wants to know if Dr. Felecia Shelling is ok with placing order now so he can get scheduled. Aware I will send back to MD to review.

## 2022-08-06 NOTE — Telephone Encounter (Signed)
I sent a message to Cristopher Peru at GI and she will call him to get scheduled. [phone]

## 2022-08-24 ENCOUNTER — Ambulatory Visit
Admission: RE | Admit: 2022-08-24 | Discharge: 2022-08-24 | Disposition: A | Discharge status: Home / Self Care | Payer: Medicare Other | Source: Ambulatory Visit | Attending: Neurology | Admitting: Neurology

## 2022-08-24 DIAGNOSIS — M5416 Radiculopathy, lumbar region: Secondary | ICD-10-CM

## 2022-08-24 MED ORDER — METHYLPREDNISOLONE ACETATE 40 MG/ML INJ SUSP (RADIOLOG
80.0000 mg | Freq: Once | INTRAMUSCULAR | Status: AC
  Administered 2022-08-24: 80 mg via EPIDURAL

## 2022-08-24 MED ORDER — IOPAMIDOL (ISOVUE-M 200) INJECTION 41%
1.0000 mL | Freq: Once | INTRAMUSCULAR | Status: AC
  Administered 2022-08-24: 1 mL via EPIDURAL

## 2022-08-24 NOTE — Discharge Instructions (Signed)

## 2022-09-14 ENCOUNTER — Telehealth: Payer: Self-pay | Admitting: Neurology

## 2022-09-14 NOTE — Telephone Encounter (Signed)
Pt wife called. Stated she is following up on pt results from the injections.

## 2022-09-14 NOTE — Telephone Encounter (Signed)
(  Pt and wife aware you are out of the office today 09/14/22, they were okay to wait for your return)  Dr.Sater pt wife is calling to get results, states [facility] told him to follow up with you. Pt wife said last injection did help, but his neck has started hurting again, pt also said his legs and back hurt. Wife asked if pt should continue getting injections?   Please advise

## 2022-09-15 NOTE — Telephone Encounter (Signed)
LVM for wife relaying Dr. Bonnita Hollow recommendation. Asked her to call back to let us know which pharmacy to send rx to.

## 2022-09-16 NOTE — Telephone Encounter (Signed)
Took call from phone staff and spoke w/ wife. They are concerned about him going on tramadol and how that would affect him. He also drinks 1-2 beers per week and worried about taking a med like this when he drinks beer. Pt now stating epidural steroid injection helps neck pain. He would like to continue if possible. Aware Dr. Epimenio Foot out today and will be back in the office tomorrow. We will discuss once he is back in and we will all back with his recommendation.

## 2022-09-16 NOTE — Telephone Encounter (Signed)
LVM again for wife to call office

## 2022-09-17 ENCOUNTER — Other Ambulatory Visit: Payer: Self-pay | Admitting: Neurology

## 2022-09-17 DIAGNOSIS — M5412 Radiculopathy, cervical region: Secondary | ICD-10-CM

## 2022-09-17 DIAGNOSIS — M542 Cervicalgia: Secondary | ICD-10-CM

## 2022-09-17 DIAGNOSIS — G459 Transient cerebral ischemic attack, unspecified: Secondary | ICD-10-CM

## 2022-09-17 NOTE — Telephone Encounter (Signed)
Called and spoke with wife. Relayed Dr. Bonnita Hollow message. Wife is going to speak with her husband about options offered first and will call back on how they want to proceed.

## 2022-09-17 NOTE — Telephone Encounter (Signed)
Dr. Epimenio Foot- I reviewed his chart. Last [facility] 08/24/22. You requested: right L4-L5 ESI. He is talking about neck pain returning (states previous ESI helped that was ordered). Do you want the same ESI ordered previously? Also, he cannot receive until 10/24/22 correct since you said he can receive every couple months?

## 2022-09-21 ENCOUNTER — Telehealth: Payer: Self-pay | Admitting: Neurology

## 2022-09-21 NOTE — Telephone Encounter (Signed)
[employer] medicare NPR sent to [facility] [phone]

## 2022-09-22 ENCOUNTER — Telehealth: Payer: Self-pay | Admitting: Neurology

## 2022-09-22 NOTE — Telephone Encounter (Signed)
Pt wife stated she received a call from [facility] to schedule pt MRI. She stated pt doesn't want a MRI done. Stated pt would prefer a CT if that would show what Dr. Epimenio Foot needs.

## 2022-09-23 NOTE — Telephone Encounter (Signed)
LVM telling pt's wife that Dr. Epimenio Foot said "MRI gives a better picture of the discs and the nerve roots.  We can hold off on the imaging" and Told her to call back before 5pm today.

## 2022-09-28 NOTE — Telephone Encounter (Signed)
Called wife and let her know that Dr. Epimenio Foot said that "MRI gives a better picture of the discs and the nerve roots.  We can hold off on the imaging". Pt wanted an appointment and he is Scheduled for 10/22/2022 at 1:30pm with Dr. Epimenio Foot.

## 2022-10-22 ENCOUNTER — Encounter: Payer: Self-pay | Admitting: Neurology

## 2022-10-22 ENCOUNTER — Ambulatory Visit: Payer: Medicare Other | Admitting: Neurology

## 2022-10-22 VITALS — BP 132/82 | HR 64 | Ht 66.0 in | Wt 175.0 lb

## 2022-10-22 DIAGNOSIS — R269 Unspecified abnormalities of gait and mobility: Secondary | ICD-10-CM | POA: Diagnosis not present

## 2022-10-22 DIAGNOSIS — J438 Other emphysema: Secondary | ICD-10-CM

## 2022-10-22 DIAGNOSIS — I951 Orthostatic hypotension: Secondary | ICD-10-CM

## 2022-10-22 DIAGNOSIS — G912 (Idiopathic) normal pressure hydrocephalus: Secondary | ICD-10-CM

## 2022-10-22 DIAGNOSIS — I639 Cerebral infarction, unspecified: Secondary | ICD-10-CM | POA: Diagnosis not present

## 2022-10-22 DIAGNOSIS — I2510 Atherosclerotic heart disease of native coronary artery without angina pectoris: Secondary | ICD-10-CM

## 2022-10-22 MED ORDER — ACETAZOLAMIDE 250 MG PO TABS: 250.0000 mg | ORAL_TABLET | Freq: Two times a day (BID) | ORAL | 5 refills | Status: AC

## 2022-10-22 NOTE — Progress Notes (Signed)
[facility]  PATIENT: Wayne [name] DOB: June 04, 1945  REFERRING DOCTOR OR PCP: Marvel Plan, MD (Stroke Hospitalist); Hillery Aldo (PCP) SOURCE: Patient, notes from recent hospital admission, imaging and lab results, MRI and CT images personally reviewed.  _________________________________   HISTORICAL  CHIEF COMPLAINT:  Chief Complaint  Patient presents with   Follow-up    Rm 11, here with son [name] and daughter in law [name] Pt states no changes since last office visit. Over all stable.      HISTORY OF PRESENT ILLNESS:  Wayne [name], [name]. Is a 77 y.o. man with a pontine stroke and his frequent dizziness.  Update 10/22/2022 He had a high volume LP in December 2023 and  he felt gait improved for the rest of that day and he mowed the yard that day.  He was pretty much back to the new baseline afterwards.  Unfortunately, he has severe COPD and has been told he is high risk for surgery.   We discussed there is no oral medication that could help.    I could refer to a surgeon for an opinion form neurosurgeon but the risk may be felt to be too much risk.    I do not know the level of anesthesia necessary for VP shunt.   He has been told by cardiology he shouldn't have surgery due to his risks, he prefers not to have a surgical opinion.   As example, his urologist had wanted to set up a biopsy under anesthesia but cardiology and pulmonology  felt the going under anesthesia would be too dangerous  He has LBP and leg pain.   Right L4L5 ESI helped him initially but a second injection had not helped.  Lightheadedness improved with fludrocortisone.  He has had a bilateral HA from occiput to temples/forehead.  e also reports a lot of neck pain.    TPIs only helped one day.   He has a lot of headache and takes a BC frequently.     Balance worsened after his stroke last year but has worsened further the last couple months. He also noted right leg weakness after the 2022 CVA  (pontine).    He has fallen a few times  a week.  All falls are backwards.    He has fallen on stairs.   His description of dizziness is reduced balance, not vertigo.  Lightheadedness got better on fludrocortisone.  He has very vivid dreams.  He talks in his sleep but does not kick/pinch/scream.  However, most nights he sleeps well.  Marland Kitchen      He used to be on topiramate for headaches  Stroke History: He presented to the [facility] ED [facility]/18/2021 due to weakness in his right leg x 1 day and dizziness.    An MRI showed a left pontine CVA and he was admitted.   Exam by Dr. Roda Shutters showed Right nasolabial fold flattening, mild facial droop.  Tongue midline.  Left upper extremity 5/5.  Right upper extremity proximal 5 -/5, right elbow fixed due to previous injury.  Right hand grip 5 -/5, slightly decreased finger dexterity.  Left lower extremity 5/5, right lower extremity proximal 4+/5, distal DF/PF 5 -/5.  Finger-to-nose slow around right.   Due to the lacunar infarction, he was advised to quit smoking and was placed on DAPT plavix/asa x 3 weeks then ASA 81 mg monotherapy .  He was also on Lipitor 40 mg .    Vascular risks:  Diabetes, hypertension,  hyperlipidemia, smoking (was 2 ppd x many years, just quit last month.   He has type II non-insulin-dependent diabetes mellitus and is on metformin.  He sometimes reports numbness in his toes but not above the ankles    Additionally, he has a long history of smoking and has advanced COPD.    EF 60 to 65%.  LDL 58, A1c 5.8.  Creatinine 0.80.  IMAGING Imaging studies from the hospital were personally reviewed and I concur with the impression. MRi Brain 02/18/2021 showed 1. Motion degraded exam. 2. 1.2 cm acute ischemic nonhemorrhagic left pontine infarct. 3. Age-related cerebral atrophy with moderately advanced chronic microvascular ischemic disease. 4. 1.4 cm probable meningioma along the posterior falx. No associated mass effect.  CTA head/neck 02/19/2021  showed: 1. Hypodensity in the left pons consistent with evolving infarct as seen on the prior brain MRI. No evidence of hemorrhage. 2. Patent intracranial vasculature with minimal atherosclerotic disease. Specifically, the basilar artery is widely patent. 3. Mild soft and calcified atherosclerotic plaque in the bilateral carotid bulbs without hemodynamically significant stenosis. Patent vertebral arteries. 4. Tortuosity of the right internal carotid artery may be due to hypertension. 5. Unchanged global parenchymal volume loss and chronic white matter microangiopathy. 6. Severe emphysema in the lung apices.  CT of the head 04/02/2022 showed progression of the chronic microvascular ischemic change compared to an image from 2019 (the study was not compared to more recent studies done in 2022) additionally it showed a meningioma that was not noted in 2019.  Of note, the meningioma had been seen on previous studies from 2022.  An addendum was reported after they had access to the 2022 imaging that there were no changes noted       Additionally, on CT scan there is ventriculomegaly out of proportion to the extent of atrophy.  Sulci are crowded at the vertex which is often seen with normal pressure hydrocephalus.  This has mildly progressed compared to 2019.    REVIEW OF SYSTEMS: Constitutional: No fevers, chills, sweats, or change in appetite Eyes: No visual changes, double vision, eye pain Ear, nose and throat: No hearing loss, ear pain, nasal congestion, sore throat Cardiovascular: No chest pain, palpitations Respiratory: He has shortness of breath at rest or with exertion due to COPD.   No wheezes.  He recently quit smoking. GastrointestinaI: No nausea, vomiting, diarrhea, abdominal pain, fecal incontinence Genitourinary:  No dysuria, urinary retention or frequency.  No nocturia. Musculoskeletal:  No neck pain, back pain Integumentary: No rash, pruritus, skin lesions Neurological: as  above Psychiatric: No depression at this time.  No anxiety Endocrine: No palpitations, diaphoresis, change in appetite, change in weigh or increased thirst Hematologic/Lymphatic:  No anemia, purpura, petechiae. Allergic/Immunologic: No itchy/runny eyes, nasal congestion, recent allergic reactions, rashes  ALLERGIES: Allergies  Allergen Reactions   Gabapentin Other (See Comments)    Adverse reaction   Sertraline Other (See Comments)    Adverse reaction   Metformin Diarrhea and Nausea Only   Pineapple Hives   Pravastatin Other (See Comments)   Lorazepam Other (See Comments)    Shakiness    HOME MEDICATIONS:  Current Outpatient Medications:    albuterol (PROVENTIL HFA) 108 (90 Base) MCG/ACT inhaler, Inhale 2 puffs into the lungs every 6 (six) hours as needed for wheezing or shortness of breath., Disp: 54 g, Rfl: 3   albuterol (PROVENTIL) (2.5 MG/3ML) 0.083% nebulizer solution, Inhale 3 mLs (2.5 mg total) into the lungs every 6 (six) hours as needed for shortness of  breath or wheezing., Disp: 75 mL, Rfl: 11   amLODipine (NORVASC) 10 MG tablet, Take 10 mg by mouth daily., Disp: , Rfl:    Aspirin-Salicylamide-Caffeine (BC HEADACHE POWDER PO), Take by mouth as needed., Disp: , Rfl:    atorvastatin (LIPITOR) 40 MG tablet, TAKE 1 TABLET BY MOUTH  DAILY (Patient taking differently: Take 40 mg by mouth daily.), Disp: 90 tablet, Rfl: 3   Blood Glucose Monitoring Suppl (ONE TOUCH ULTRA 2) w/Device KIT, Use daily to check blood sugar E11.9, Disp: 1 kit, Rfl: 0   budesonide (PULMICORT) 0.25 MG/2ML nebulizer solution, Take 2 mLs (0.25 mg total) by nebulization daily., Disp: 60 mL, Rfl: 5   eszopiclone (LUNESTA) 1 MG TABS tablet, Take 1 mg by mouth at bedtime., Disp: , Rfl:    finasteride (PROSCAR) 5 MG tablet, Take 1 tablet (5 mg total) by mouth at bedtime., Disp: 90 tablet, Rfl: 2   fludrocortisone (FLORINEF) 0.1 MG tablet, Take 1 tablet (0.1 mg total) by mouth daily., Disp: 30 tablet, Rfl: 11    fluticasone (FLONASE) 50 MCG/ACT nasal spray, USE 2 SPRAY(S) IN EACH NOSTRIL ONCE DAILY AS NEEDED FOR ALLERGIES OR RHIITIS (Patient taking differently: Place 2 sprays into both nostrils daily as needed for allergies.), Disp: 16 g, Rfl: 0   formoterol (PERFOROMIST) 20 MCG/2ML nebulizer solution, Take 2 mLs (20 mcg total) by nebulization 2 (two) times daily., Disp: 120 mL, Rfl: 5   glucose blood test strip, Use new test strip each time when checking blood sugar, Disp: 100 each, Rfl: 2   metFORMIN (GLUCOPHAGE) 500 MG tablet, Take 500 mg by mouth daily., Disp: , Rfl:    omeprazole (PRILOSEC) 40 MG capsule, TAKE 1 CAPSULE BY MOUTH  DAILY (Patient taking differently: Take 40 mg by mouth daily.), Disp: 90 capsule, Rfl: 3   OneTouch Delica Lancets 33G MISC, Use a new lancet each time when checking blood sugar, Disp: 100 each, Rfl: 2   pyridostigmine (MESTINON) 60 MG tablet, Take 0.5 tablets (30 mg total) by mouth 3 (three) times daily., Disp: 45 tablet, Rfl: 5   sildenafil (VIAGRA) 100 MG tablet, TAKE 1 TABLET BY MOUTH AS NEEDED APPROXIMATELY 1 HOUR PRIOR TO SEXUAL ACTIVITY (Patient taking differently: Take 100 mg by mouth as needed for erectile dysfunction.), Disp: 10 tablet, Rfl: 2   tamsulosin (FLOMAX) 0.4 MG CAPS capsule, TAKE 1 CAPSULE BY MOUTH  DAILY, Disp: 90 capsule, Rfl: 3   Tiotropium Bromide Monohydrate (SPIRIVA RESPIMAT) 2.5 MCG/ACT AERS, Inhale 2 puffs into the lungs daily., Disp: 1 each, Rfl: 3   topiramate (TOPAMAX) 25 MG tablet, Take 75 mg by mouth at bedtime., Disp: , Rfl:   PAST MEDICAL HISTORY: Past Medical History:  Diagnosis Date   BPH (benign prostatic hyperplasia)    COPD (chronic obstructive pulmonary disease) (HCC)    Diabetes mellitus without complication (HCC)    GERD (gastroesophageal reflux disease)    Hypertension    Male erectile disorder    Meniere's disease    Mixed hyperlipidemia    Nontoxic single thyroid nodule    Somnolence    Thoracic aortic aneurysm (HCC)     per CT chest 08/29/20 in Epic - stable diffuse aneurysmal dilation of the descending thoracic aorta with associated significant atheroschlerosis. 3.8 cm in maximum diameter.    PAST SURGICAL HISTORY: Past Surgical History:  Procedure Laterality Date   CATARACT EXTRACTION Bilateral    ELBOW SURGERY Right     FAMILY HISTORY: Family History  Problem Relation Age of Onset  Heart attack Mother    Diabetes Father    COPD Father    Diverticulitis Brother     SOCIAL HISTORY:  Social History   Socioeconomic History   Marital status: Married    Spouse name: Aram Beecham   Number of children: 3   Years of education: 12   Highest education level: Not on file  Occupational History   Not on file  Tobacco Use   Smoking status: Former    Packs/day: 2.00    Years: 50.00    Additional pack years: 0.00    Total pack years: 100.00    Types: Cigarettes    Quit date: 02/15/2021    Years since quitting: 1.6   Smokeless tobacco: Never  Substance and Sexual Activity   Alcohol use: Yes    Comment: occasional   Drug use: No   Sexual activity: Not on file  Other Topics Concern   Not on file  Social History Narrative   Left handed   Caffeine use: 1-2 cup per day   Social Determinants of Health   Financial Resource Strain: Not on file  Food Insecurity: Not on file  Transportation Needs: Not on file  Physical Activity: Not on file  Stress: Not on file  Social Connections: Not on file  Intimate Partner Violence: Not on file     PHYSICAL EXAM  Vitals:   10/22/22 1317 10/22/22 1323  BP: (!) 150/76 132/82  Pulse: 72 64  Weight: 175 lb (79.4 kg)   Height: 5\' 6"  (1.676 m)     Body mass index is 28.25 kg/m.    General: The patient is well-developed and well-nourished and in no acute distress  HEENT:  Head is [place]/AT.  Sclera are anicteric.     Neck: No carotid bruits are noted.  He has mild tenderness in the occiput and cervical paraspinal muscles. bilaerally  Range of motion is  mildly reduced.  Skin: Extremities are without rash or  edema.  Musculoskeletal:  Back is nontender  Neurologic Exam  Mental status: The patient is alert and oriented x 3 at the time of the examination. The patient has apparent normal recent and remote memory, with an apparently normal attention span and concentration ability.   Speech is normal.  Cranial nerves: Extraocular movements are full.   There is good facial sensation to soft touch bilaterally.Facial strength is normal.  Trapezius and sternocleidomastoid strength is normal. No dysarthria is noted. He has hearing deficits  Motor:  Muscle bulk is normal.   Tone is normal. Strength is  5 / 5 in all 4 extremities except 4+/5 right iliopsoas and right toe extension.  4+ right ankle extension.  Sensory: Sensory testing is intact to pinprick, soft touch and vibration sensation in arms he has reduced vibration sensation at the ankles   Coordination: Cerebellar testing reveals good finger-nose-finger and heel-to-shin bilaterally.  Gait and station: Station is normal once up though he needs assistance or use of both arms to stand up.  Gait is shuffling and he cannot tandem.   Unstable if disturbed backwards.      Romberg is borderline.   Reflexes: Deep tendon reflexes are symmetric and normal bilaterally.       DIAGNOSTIC DATA (LABS, IMAGING, TESTING) - I reviewed patient records, labs, notes, testing and imaging myself where available.  Lab Results  Component Value Date   WBC 7.6 02/19/2021   HGB 14.0 02/19/2021   HCT 40.9 02/19/2021   MCV 89.3 02/19/2021   PLT  259 02/19/2021      Component Value Date/Time   NA 139 02/24/2021 0312   NA 141 08/13/2020 0957   K 3.2 (L) 02/24/2021 0312   CL 108 02/24/2021 0312   CO2 23 02/24/2021 0312   GLUCOSE 110 (H) 02/24/2021 0312   BUN 12 02/24/2021 0312   BUN 10 08/13/2020 0957   CREATININE 1.04 02/24/2021 0312   CALCIUM 8.6 (L) 02/24/2021 0312   PROT 6.6 04/22/2022 0943   ALBUMIN  3.7 02/18/2021 1221   ALBUMIN 4.4 08/13/2020 0957   AST 13 (L) 02/18/2021 1221   ALT 13 02/18/2021 1221   ALKPHOS 58 02/18/2021 1221   BILITOT 1.0 02/18/2021 1221   BILITOT 0.6 08/13/2020 0957   GFRNONAA >60 02/24/2021 0312   GFRAA 88 01/25/2020 1133   Lab Results  Component Value Date   CHOL 106 02/19/2021   HDL 32 (L) 02/19/2021   LDLCALC 58 02/19/2021   LDLDIRECT 170.3 07/02/2006   TRIG 81 02/19/2021   CHOLHDL 3.3 02/19/2021   Lab Results  Component Value Date   HGBA1C 5.8 (H) 02/19/2021   Lab Results  Component Value Date   VITAMINB12 292 04/22/2022   Lab Results  Component Value Date   TSH 2.540 10/20/2016       ASSESSMENT AND PLAN  NPH (normal pressure hydrocephalus) (HCC)  Left pontine CVA (HCC)  Gait disturbance  Orthostatic hypotension  Other emphysema (HCC)  Chronic coronary artery disease   For the stroke, he will continue to take aspirin.  He can substitute BC powder since they contain ASA. He had a short-term benefit from the high-volume lumbar puncture being able to accomplish more that day than usual.  We had discussed referral to neurosurgery but he reports being told by his pulmonologist and cardiologist that another operation was too risky and that he should not have it.    Trial of acetazolamide for NPH.   We discussed that there have been some reports of a benefit though less likely to help than surgery.    If no benefit in 2-3 weeks, he should stop the medicatio Might do better with a walker - if he uses one adjust height a little lower to help reduce backward falls Lightweight wheelchair for safety inside his house He will return to see me in 6 months or sooner if there are new or worsening neurologic Symptoms .  This visit is part of a comprehensive longitudinal care medical relationship regarding the patients primary diagnosis of stroke, NPH and related concerns.   Dajahnae Vondra A. Epimenio Foot, MD, [facility] 10/22/2022, 2:12 PM Certified in  Neurology, Clinical Neurophysiology, Sleep Medicine and Neuroimaging  [facility] [address] [place], [place] [address] [phone]

## 2023-03-18 ENCOUNTER — Other Ambulatory Visit: Payer: Self-pay

## 2023-03-18 ENCOUNTER — Other Ambulatory Visit: Payer: Self-pay | Admitting: Neurology

## 2023-03-18 ENCOUNTER — Telehealth: Payer: Self-pay | Admitting: Neurology

## 2023-03-18 ENCOUNTER — Emergency Department (HOSPITAL_BASED_OUTPATIENT_CLINIC_OR_DEPARTMENT_OTHER): Payer: Medicare Other

## 2023-03-18 ENCOUNTER — Encounter (HOSPITAL_BASED_OUTPATIENT_CLINIC_OR_DEPARTMENT_OTHER): Payer: Self-pay

## 2023-03-18 ENCOUNTER — Emergency Department (HOSPITAL_BASED_OUTPATIENT_CLINIC_OR_DEPARTMENT_OTHER)
Admission: EM | Admit: 2023-03-18 | Discharge: 2023-03-18 | Disposition: A | Discharge status: Home / Self Care | Payer: Medicare Other | Attending: Emergency Medicine | Admitting: Emergency Medicine

## 2023-03-18 DIAGNOSIS — R4182 Altered mental status, unspecified: Secondary | ICD-10-CM | POA: Insufficient documentation

## 2023-03-18 DIAGNOSIS — I10 Essential (primary) hypertension: Secondary | ICD-10-CM | POA: Insufficient documentation

## 2023-03-18 DIAGNOSIS — J449 Chronic obstructive pulmonary disease, unspecified: Secondary | ICD-10-CM | POA: Insufficient documentation

## 2023-03-18 DIAGNOSIS — E119 Type 2 diabetes mellitus without complications: Secondary | ICD-10-CM | POA: Diagnosis not present

## 2023-03-18 DIAGNOSIS — Z79899 Other long term (current) drug therapy: Secondary | ICD-10-CM | POA: Insufficient documentation

## 2023-03-18 DIAGNOSIS — Z7982 Long term (current) use of aspirin: Secondary | ICD-10-CM | POA: Insufficient documentation

## 2023-03-18 DIAGNOSIS — N3001 Acute cystitis with hematuria: Secondary | ICD-10-CM | POA: Insufficient documentation

## 2023-03-18 DIAGNOSIS — G912 (Idiopathic) normal pressure hydrocephalus: Secondary | ICD-10-CM | POA: Insufficient documentation

## 2023-03-18 DIAGNOSIS — E876 Hypokalemia: Secondary | ICD-10-CM | POA: Insufficient documentation

## 2023-03-18 DIAGNOSIS — F17200 Nicotine dependence, unspecified, uncomplicated: Secondary | ICD-10-CM | POA: Diagnosis not present

## 2023-03-18 LAB — CBC
HCT: 37.5 % — ABNORMAL LOW (ref 39.0–52.0)
Hemoglobin: 12.7 g/dL — ABNORMAL LOW (ref 13.0–17.0)
MCH: 29 pg (ref 26.0–34.0)
MCHC: 33.9 g/dL (ref 30.0–36.0)
MCV: 85.6 fL (ref 80.0–100.0)
Platelets: 284 10*3/uL (ref 150–400)
RBC: 4.38 MIL/uL (ref 4.22–5.81)
RDW: 13.1 % (ref 11.5–15.5)
WBC: 6.7 10*3/uL (ref 4.0–10.5)
nRBC: 0 % (ref 0.0–0.2)

## 2023-03-18 LAB — COMPREHENSIVE METABOLIC PANEL
ALT: 7 U/L (ref 0–44)
AST: 10 U/L — ABNORMAL LOW (ref 15–41)
Albumin: 3.7 g/dL (ref 3.5–5.0)
Alkaline Phosphatase: 67 U/L (ref 38–126)
Anion gap: 11 (ref 5–15)
BUN: 18 mg/dL (ref 8–23)
CO2: 28 mmol/L (ref 22–32)
Calcium: 9.5 mg/dL (ref 8.9–10.3)
Chloride: 101 mmol/L (ref 98–111)
Creatinine, Ser: 1.1 mg/dL (ref 0.61–1.24)
GFR, Estimated: >60 mL/min (ref >60)
Glucose, Bld: 190 mg/dL — ABNORMAL HIGH (ref 70–99)
Potassium: 3 mmol/L — ABNORMAL LOW (ref 3.5–5.1)
Sodium: 140 mmol/L (ref 135–145)
Total Bilirubin: 0.8 mg/dL (ref 0.3–1.2)
Total Protein: 6.7 g/dL (ref 6.5–8.1)

## 2023-03-18 LAB — URINALYSIS, ROUTINE W REFLEX MICROSCOPIC
Bacteria, UA: NONE SEEN
Bilirubin Urine: NEGATIVE
Glucose, UA: NEGATIVE mg/dL
Nitrite: NEGATIVE
Protein, ur: 30 mg/dL — AB
RBC / HPF: >50 RBC/hpf (ref 0–5)
Specific Gravity, Urine: 1.022 (ref 1.005–1.030)
WBC, UA: >50 WBC/hpf (ref 0–5)
pH: 6 (ref 5.0–8.0)

## 2023-03-18 LAB — APTT: aPTT: 30 s (ref 24–36)

## 2023-03-18 LAB — AMMONIA: Ammonia: 15 umol/L (ref 9–35)

## 2023-03-18 LAB — CBG MONITORING, ED: Glucose-Capillary: 185 mg/dL — ABNORMAL HIGH (ref 70–99)

## 2023-03-18 LAB — ETHANOL: Alcohol, Ethyl (B): <10 mg/dL (ref <10)

## 2023-03-18 LAB — PROTIME-INR
INR: 1.2 (ref 0.8–1.2)
Prothrombin Time: 15.1 s (ref 11.4–15.2)

## 2023-03-18 MED ORDER — SODIUM CHLORIDE 0.9 % IV SOLN
1.0000 g | Freq: Once | INTRAVENOUS | Status: AC
  Administered 2023-03-18: 1 g via INTRAVENOUS
  Filled 2023-03-18: qty 10

## 2023-03-18 MED ORDER — CEFADROXIL 500 MG PO CAPS: 500.0000 mg | ORAL_CAPSULE | Freq: Two times a day (BID) | ORAL | 0 refills | Status: AC

## 2023-03-18 MED ORDER — POTASSIUM CHLORIDE CRYS ER 20 MEQ PO TBCR
40.0000 meq | EXTENDED_RELEASE_TABLET | Freq: Once | ORAL | Status: AC
  Administered 2023-03-18: 40 meq via ORAL
  Filled 2023-03-18: qty 2

## 2023-03-18 NOTE — ED Notes (Signed)
Report received from [facility]. Patient has rocephin infusing, will be discharged upon completion. Patient resting quietly in stretcher, respirations even, unlabored, no acute distress noted. Denies needs at this time.

## 2023-03-18 NOTE — Telephone Encounter (Signed)
Pt's wife called wanting to know if an RN or MD can call to discuss pt's behavior due to possibly needing another drain through his spine. Pt has been acting disoriented, confused. He does not recognize his family members including his wife of 63yrs. Please advise.

## 2023-03-18 NOTE — Plan of Care (Signed)
On-call neurology note Called by ED provider [facility] regarding this patient with a diagnosis of NPH has been having worsening confusion and gait issues. He has been evaluated by neurology and shunting has not been pursued because of him being deemed "high risk" for my anesthesia standpoint-I do not know why that is. At this point, I would recommend a high-volume LP with at least 20 if possible 30 cc of fluid to be drawn after measurement of opening pressure. If his symptoms get better, he should be discharged home with outpatient follow-up. If symptoms do not improve, then he may need to come to the hospital for therapy evaluations as well as possibly a surgical consultation with neurosurgery for VP shunt placement. Please call neurology if he is admitted inpatient for any further discussions regarding this. This was a phone only curbside consultation.  -- Milon Dikes, MD Neurologist [facility]

## 2023-03-18 NOTE — ED Notes (Signed)
Reviewed AVS with patient, patient expressed understanding of directions, denies further questions at this time. 

## 2023-03-18 NOTE — Discharge Instructions (Addendum)
As we discussed his lab work today does indicate what seems to be the return of urinary tract infection, I am sending some antibiotics to treat the urinary tract infection and we are sending it for culture to ensure that it is susceptible to the antibiotics that I prescribed.  We were suspicious that there is some degree of your previously diagnosed normal pressure hydrocephalus that is contributing to his symptoms, and recommended lumbar puncture in the emergency department versus hospital admission, but you declined at this time.  I recommend that you return for further evaluation for either lumbar puncture or hospital admission if your symptoms worsen, I recommend that you take special care to avoid patient having falls or walking without assistance.  Please contact your neurologist at your earliest convenience to discuss a therapeutic lumbar puncture to remove the fluid and help with your symptoms.  Your potassium was slightly low in the emergency department as well, we gave you some potassium to get you close to a normal value, but I recommend you follow-up closely with your primary care doctor to ensure that your potassium is returning to a normal range.

## 2023-03-18 NOTE — ED Provider Notes (Signed)
[name] EMERGENCY DEPARTMENT AT [facility] Provider Note   CSN: [id number] Arrival date & time: 03/18/23  1424     History  Chief Complaint  Patient presents with   Altered Mental Status    Wayne [name] is a 77 y.o. male with past medical history significant for hypertension, memory loss, diabetes, emphysema, COPD, previous left pontine CVA, previous TIAs, thoracic aortic aneurysm, tobacco use, family reports that Wayne [name] previously had some "fluid on the brain" that needed to be drawn off, suspect hydrocephalus based on the description who presents with concern for confusion, increased difficulty walking, dizziness, difficulty recognizing people, and intermittent loss of control of bladder.   Altered Mental Status      Home Medications Prior to Admission medications   Medication Sig Start Date End Date Taking? Authorizing Provider  cefadroxil (DURICEF) 500 MG capsule Take 1 capsule (500 mg total) by mouth 2 (two) times daily. 03/18/23  Yes Dajuan Turnley H, PA-C  acetaZOLAMIDE (DIAMOX) 250 MG tablet Take 1 tablet (250 mg total) by mouth 2 (two) times daily. 10/22/22   Sater, Pearletha Furl, MD  albuterol (PROVENTIL HFA) 108 (90 Base) MCG/ACT inhaler Inhale 2 puffs into the lungs every 6 (six) hours as needed for wheezing or shortness of breath. 09/16/21   Charlott Holler, MD  albuterol (PROVENTIL) (2.5 MG/3ML) 0.083% nebulizer solution Inhale 3 mLs (2.5 mg total) into the lungs every 6 (six) hours as needed for shortness of breath or wheezing. 10/02/21   Charlott Holler, MD  amLODipine (NORVASC) 10 MG tablet Take 10 mg by mouth daily. 11/29/20   [provider]  Aspirin-Salicylamide-Caffeine (BC HEADACHE POWDER PO) Take by mouth as needed.    [provider]  atorvastatin (LIPITOR) 40 MG tablet TAKE 1 TABLET BY MOUTH  DAILY Patient taking differently: Take 40 mg by mouth daily. 02/13/21   CoxFritzi Mandes, MD  Blood Glucose Monitoring Suppl (ONE TOUCH ULTRA  2) w/Device KIT Use daily to check blood sugar E11.9 02/05/20   Cox, Kirsten, MD  budesonide (PULMICORT) 0.25 MG/2ML nebulizer solution Take 2 mLs (0.25 mg total) by nebulization daily. 11/19/21   Charlott Holler, MD  eszopiclone (LUNESTA) 1 MG TABS tablet Take 1 mg by mouth at bedtime. 12/31/20   [provider]  finasteride (PROSCAR) 5 MG tablet Take 1 tablet (5 mg total) by mouth at bedtime. 05/21/20   Abigail Miyamoto, MD  fludrocortisone (FLORINEF) 0.1 MG tablet Take 1 tablet (0.1 mg total) by mouth daily. 08/26/21   Sater, Pearletha Furl, MD  fluticasone (FLONASE) 50 MCG/ACT nasal spray USE 2 SPRAY(S) IN EACH NOSTRIL ONCE DAILY AS NEEDED FOR ALLERGIES OR RHIITIS Patient taking differently: Place 2 sprays into both nostrils daily as needed for allergies. 09/03/20   Marianne Sofia, PA-C  formoterol (PERFOROMIST) 20 MCG/2ML nebulizer solution Take 2 mLs (20 mcg total) by nebulization 2 (two) times daily. 06/25/21   Charlott Holler, MD  glucose blood test strip Use new test strip each time when checking blood sugar 02/21/20   Cox, Kirsten, MD  omeprazole (PRILOSEC) 40 MG capsule TAKE 1 CAPSULE BY MOUTH  DAILY Patient taking differently: Take 40 mg by mouth daily. 06/28/20   CoxFritzi Mandes, MD  OneTouch Delica Lancets 33G MISC Use a new lancet each time when checking blood sugar 02/21/20   Cox, Kirsten, MD  pyridostigmine (MESTINON) 60 MG tablet Take 0.5 tablets (30 mg total) by mouth 3 (three) times daily. 03/28/21   Sater, Pearletha Furl,  MD  sildenafil (VIAGRA) 100 MG tablet TAKE 1 TABLET BY MOUTH AS NEEDED APPROXIMATELY 1 HOUR PRIOR TO SEXUAL ACTIVITY Patient taking differently: Take 100 mg by mouth as needed for erectile dysfunction. 10/18/20   Blane Ohara, MD  tamsulosin (FLOMAX) 0.4 MG CAPS capsule TAKE 1 CAPSULE BY MOUTH  DAILY 12/12/20   Abigail Miyamoto, MD  Tiotropium Bromide Monohydrate (SPIRIVA RESPIMAT) 2.5 MCG/ACT AERS Inhale 2 puffs into the lungs daily. 01/07/22   Charlott Holler, MD       Allergies    Gabapentin, Sertraline, Metformin, Pineapple, Pravastatin, and Lorazepam    Review of Systems   Review of Systems  All other systems reviewed and are negative.   Physical Exam Updated Vital Signs BP 126/73   Pulse 82   Temp 98.3 F (36.8 C)   Resp 16   SpO2 95%  Physical Exam Vitals and nursing note reviewed.  Constitutional:      General: Wayne [name] is not in acute distress.    Appearance: Normal appearance.  HENT:     Head: Normocephalic and atraumatic.  Eyes:     General:        Right eye: No discharge.        Left eye: No discharge.  Cardiovascular:     Rate and Rhythm: Normal rate and regular rhythm.     Heart sounds: No murmur heard.    No friction rub. No gallop.  Pulmonary:     Effort: Pulmonary effort is normal.     Breath sounds: Normal breath sounds.  Abdominal:     General: Bowel sounds are normal.     Palpations: Abdomen is soft.  Skin:    General: Skin is warm and dry.     Capillary Refill: Capillary refill takes less than 2 seconds.  Neurological:     Mental Status: Wayne [name] is alert and oriented to person, place, and time.     Comments: Cranial nerves II through XII grossly intact.  Intact finger-nose, intact heel-to-shin.  Romberg unsteady, gait wobbly without assistance.  Alert and oriented x3.  Moves all 4 limbs spontaneously, normal coordination.  No pronator drift.  Intact strength 5 out of 5 bilateral upper and lower extremities.  With some global confusion per family, has endorsed seeing some people in the house that family confirm were not there, and confused his wife of 30 years for someone else    Psychiatric:        Mood and Affect: Mood normal.        Behavior: Behavior normal.     ED Results / Procedures / Treatments   Labs (all labs ordered are listed, but only abnormal results are displayed) Labs Reviewed  COMPREHENSIVE METABOLIC PANEL - Abnormal; Notable for the following components:      Result Value   Potassium 3.0 (*)     Glucose, Bld 190 (*)    AST 10 (*)    All other components within normal limits  CBC - Abnormal; Notable for the following components:   Hemoglobin 12.7 (*)    HCT 37.5 (*)    All other components within normal limits  URINALYSIS, ROUTINE W REFLEX MICROSCOPIC - Abnormal; Notable for the following components:   APPearance CLOUDY (*)    Hgb urine dipstick LARGE (*)    Ketones, ur TRACE (*)    Protein, ur 30 (*)    Leukocytes,Ua LARGE (*)    All other components within normal limits  CBG MONITORING, ED - Abnormal; Notable  for the following components:   Glucose-Capillary 185 (*)    All other components within normal limits  URINE CULTURE  AMMONIA  ETHANOL  APTT  PROTIME-INR    EKG EKG Interpretation Date/Time:  Thursday March 18 2023 14:52:20 EDT Ventricular Rate:  71 PR Interval:  171 QRS Duration:  105 QT Interval:  427 QTC Calculation: 461 R Axis:   -12  Text Interpretation: Sinus rhythm Multiple ventricular premature complexes PVCs otherwise unchanged from prior EKG Confirmed by Elayne Snare ([id number]) on 03/18/2023 3:36:17 PM  Radiology CT Head Wo Contrast  Result Date: 03/18/2023 CLINICAL DATA:  Concern for acute infarct, altered mental status EXAM: CT HEAD WITHOUT CONTRAST TECHNIQUE: Contiguous axial images were obtained from the base of the skull through the vertex without intravenous contrast. RADIATION DOSE REDUCTION: This exam was performed according to the departmental dose-optimization program which includes automated exposure control, adjustment of the mA and/or kV according to patient size and/or use of iterative reconstruction technique. COMPARISON:  02/19/2021 CTA head and neck FINDINGS: Brain: No evidence of acute infarction, hemorrhage, mass, mass effect, or midline shift. No hydrocephalus or extra-axial fluid collection. Periventricular white matter changes, likely the sequela of chronic small vessel ischemic disease. Advanced cerebral atrophy for age,  with ex vacuo dilatation of the ventricles. Remote lacunar infarcts left cerebellum and left corona radiata. Vascular: No hyperdense vessel. Atherosclerotic calcifications in the intracranial carotid and vertebral arteries. Skull: Negative for fracture or focal lesion. Sinuses/Orbits: No acute finding. Status post bilateral lens replacements. Other: The mastoid air cells are well aerated. IMPRESSION: No acute intracranial process. Electronically Signed   By: Wiliam Ke M.D.   On: 03/18/2023 16:42    Procedures Procedures    Medications Ordered in ED Medications  cefTRIAXone (ROCEPHIN) 1 g in sodium chloride 0.9 % 100 mL IVPB (has no administration in time range)  potassium chloride SA (KLOR-CON M) CR tablet 40 mEq (40 mEq Oral Given 03/18/23 1647)    ED Course/ Medical Decision Making/ A&P Clinical Course as of 03/18/23 1803  Thu Mar 18, 2023  1727 Take 20-30 ccs off on LP, check pressure [CP]    Clinical Course User Index [CP] Olene Floss, PA-C                                 Medical Decision Making Amount and/or Complexity of Data Reviewed Labs: ordered.   This patient is a 77 y.o. male who presents to the ED for concern of ams, this involves an extensive number of treatment options, and is a complaint that carries with it a high risk of complications and morbidity. The emergent differential diagnosis prior to evaluation includes, but is not limited to,  CVA, seizure, hypotension, sepsis, hypoglycemia, hypoxic encephalopathy, metabolic encephalopathy, polypharmacy, substance abuse, developing dementia or alzheimers, meningitis, encephalitis, hypertensive emergency, other systemic infection, acute alcohol intoxication, acute alcohol or other drug withdrawal or psychiatric manifestation vs other . This is not an exhaustive differential.   Past Medical History / Co-morbidities / Social History: hypertension, memory loss, diabetes, emphysema, COPD, previous left pontine CVA,  previous TIAs, thoracic aortic aneurysm, tobacco use, NPH  Additional history: Chart reviewed. Pertinent results include: reviewed labwork, imaging from previous ED visits, as well as outpatient neuro visits  Physical Exam: Physical exam performed. The pertinent findings include: Cranial nerves II through XII grossly intact.  Intact finger-nose, intact heel-to-shin.  Romberg unsteady, gait wobbly without assistance.  Alert  and oriented x3.  Moves all 4 limbs spontaneously, normal coordination.  No pronator drift.  Intact strength 5 out of 5 bilateral upper and lower extremities.  With some global confusion per family, has endorsed seeing some people in the house that family confirm were not there, and confused his wife of 30 years for someone else  Lab Tests: I ordered, and personally interpreted labs.  The pertinent results include: CBC overall unremarkable, mild anemia, hemoglobin 12.7.  His CMP is notable for moderate hypokalemia, testing 3.0, we will orally replete, glucose mildly elevated at 190 for nonfasting lab values.  UA with large hemoglobin, trace ketones, large leukocytes, and greater than 50 white blood cells although no bacteria are noted.  We will send for culture but treat presumptively for developing acute cystitis, Rocephin administered in the emergency department and patient discharged with cefadroxil.   Imaging Studies: I ordered imaging studies including CT head without contrast. I independently visualized and interpreted imaging which showed no significant change from baseline. I agree with the radiologist interpretation.   Cardiac Monitoring:  The patient was maintained on a cardiac monitor.  My attending physician Dr. Theresia Lo viewed and interpreted the cardiac monitored which showed an underlying rhythm of: Normal sinus rhythm, PVCs noted, otherwise unchanged from baseline. I agree with this interpretation.   Medications: I ordered medication including Rocephin for UTI,  potassium chloride for hypokalemia.   Consultations Obtained: I requested consultation with the neurologist, spoke with Dr. Wilford Corner,  and discussed lab and imaging findings as well as pertinent plan - they recommend: Given his history of NPH, consolation of symptoms, Wayne [name] recommended therapeutic drainage of CSF through lumbar puncture at least 20-30 cc, and reevaluation of gait to see if improved after the fact.  Encouraged checking of opening pressure.  I spoke to the family about therapeutic lumbar puncture in the emergency department, versus admission for altered mental status with probable lumbar puncture under fluoroscopic guidance during admission, they declined both LP in the ED and hospital admission at this time, they will contact his neurologist and talk to them about an outpatient therapeutic lumbar puncture.  Discussed that this is not our recommendation in the ED due to the risk of worsening mental status, fall, but patient and family adamantly declined at this time stating that they understand the risk of falls, and since patient is not ready for lumbar puncture at this time they feel comfortable waiting to have LP performed in outpatient setting.   Disposition: After consideration of the diagnostic results and the patients response to treatment, I feel that patient does have signs of altered mental status, and unsteady gait that would benefit from either therapeutic lumbar puncture or hospital admission for further evaluation of mental status, but they after extensive discussion above request discharge at this time, given close monitoring by family, involvement of both his wife and children I think reasonable to allow patient directed discharge at this time, discussed extensive return precautions.   I discussed this case with my attending physician Dr. Theresia Lo who cosigned this note including patient's presenting symptoms, physical exam, and planned diagnostics and interventions. Attending  physician stated agreement with plan or made changes to plan which were implemented.    Final Clinical Impression(s) / ED Diagnoses Final diagnoses:  Altered mental status, unspecified altered mental status type  Normal pressure hydrocephalus (HCC)  Acute cystitis with hematuria  Hypokalemia    Rx / DC Orders ED Discharge Orders  Ordered    cefadroxil (DURICEF) 500 MG capsule  2 times daily        03/18/23 1758              Olene Floss, PA-C 03/18/23 1803    Rexford Maus, DO 03/25/23 1542

## 2023-03-18 NOTE — ED Notes (Signed)
ED Provider at bedside. 

## 2023-03-18 NOTE — Telephone Encounter (Signed)
Called wife back. Advised I spoke with Dr. Epimenio Foot and he would like him to go to ER to be evaluated/treated asap. She verbalized understanding. They will go to either [place] or [facility].

## 2023-03-18 NOTE — ED Triage Notes (Signed)
Pt endorses hx of stroke, states "it feels like I might be having another one." Pt A&O, advised that daughter called doctor today & was advised to come to ED for further eval.  Confusion, difficulty recognizing people, dizziness increasing this week.

## 2023-03-19 LAB — URINE CULTURE: Culture: <10000 — AB

## 2023-03-22 ENCOUNTER — Other Ambulatory Visit: Payer: Self-pay | Admitting: Neurology

## 2023-03-22 DIAGNOSIS — G912 (Idiopathic) normal pressure hydrocephalus: Secondary | ICD-10-CM

## 2023-03-22 NOTE — Telephone Encounter (Signed)
I sent a message to Deatra James at GI and she said she will call him today to schedule.

## 2023-03-22 NOTE — Telephone Encounter (Signed)
Called wife back. Relayed that Dr. Epimenio Foot ordered high volume LP to be completed at [facility] imaging. I asked they call her at [phone] to schedule. She verbalized understanding.

## 2023-03-22 NOTE — Telephone Encounter (Signed)
Pt's wife has called, asking Dr Epimenio Foot calls her to discuss fluid around pt's brain and what Dr Epimenio Foot wants to do about that. Wife asking to be called at [phone]

## 2023-03-22 NOTE — Telephone Encounter (Signed)
Dr. Epimenio Foot- how do you want to proceed? Here is a snip from ED notes:

## 2023-03-23 NOTE — Discharge Instructions (Signed)

## 2023-03-24 ENCOUNTER — Ambulatory Visit
Admission: RE | Admit: 2023-03-24 | Discharge: 2023-03-24 | Disposition: A | Discharge status: Home / Self Care | Payer: Medicare Other | Source: Ambulatory Visit | Attending: Neurology | Admitting: Neurology

## 2023-03-24 DIAGNOSIS — G912 (Idiopathic) normal pressure hydrocephalus: Secondary | ICD-10-CM

## 2023-03-25 ENCOUNTER — Telehealth: Payer: Self-pay | Admitting: Neurology

## 2023-03-25 DIAGNOSIS — G971 Other reaction to spinal and lumbar puncture: Secondary | ICD-10-CM

## 2023-03-25 NOTE — Telephone Encounter (Addendum)
Called wife back. He has had severe headache today, does not feel well. Completed LP around 1pm. Neck/head hurts worse when he sits up. Laid flat yesterday and last night as instructed. Stomach was upset but has resolved. Aware I will speak with MD and call back.  I spoke with Dr. Epimenio Foot, he would like to order blood patch for post LP headache and fit him in for appt next week.   Called wife back.  Relayed above info. She will call [facility] imaging at [phone] to schedule blood patch.  Also scheduled f/u for him to see Dr. Epimenio Foot 03/31/23 at 9am.

## 2023-03-25 NOTE — Addendum Note (Signed)
Addended by: Arther Abbott on: 03/25/2023 01:41 PM   Modules accepted: Orders

## 2023-03-25 NOTE — Telephone Encounter (Signed)
Patient wife called stating she was wondering what the results are from his LP from that he had yesterday and what the next steps will be.

## 2023-03-31 ENCOUNTER — Ambulatory Visit: Payer: Medicare Other | Admitting: Neurology

## 2023-05-03 ENCOUNTER — Telehealth: Payer: Self-pay | Admitting: Neurology

## 2023-05-03 NOTE — Telephone Encounter (Signed)
Wife asked appt be cx

## 2023-05-04 ENCOUNTER — Ambulatory Visit: Payer: Medicare Other | Admitting: Neurology

## 2023-06-19 DIAGNOSIS — E872 Acidosis, unspecified: Secondary | ICD-10-CM | POA: Diagnosis not present

## 2023-06-19 DIAGNOSIS — J441 Chronic obstructive pulmonary disease with (acute) exacerbation: Secondary | ICD-10-CM | POA: Diagnosis not present

## 2023-06-19 DIAGNOSIS — J9621 Acute and chronic respiratory failure with hypoxia: Secondary | ICD-10-CM | POA: Diagnosis not present

## 2023-06-20 DIAGNOSIS — E872 Acidosis, unspecified: Secondary | ICD-10-CM | POA: Diagnosis not present

## 2023-06-20 DIAGNOSIS — J441 Chronic obstructive pulmonary disease with (acute) exacerbation: Secondary | ICD-10-CM | POA: Diagnosis not present

## 2023-06-20 DIAGNOSIS — J9621 Acute and chronic respiratory failure with hypoxia: Secondary | ICD-10-CM | POA: Diagnosis not present

## 2023-06-21 DIAGNOSIS — J441 Chronic obstructive pulmonary disease with (acute) exacerbation: Secondary | ICD-10-CM | POA: Diagnosis not present

## 2023-06-21 DIAGNOSIS — J9621 Acute and chronic respiratory failure with hypoxia: Secondary | ICD-10-CM | POA: Diagnosis not present

## 2023-06-21 DIAGNOSIS — E872 Acidosis, unspecified: Secondary | ICD-10-CM | POA: Diagnosis not present

## 2023-06-24 DIAGNOSIS — I719 Aortic aneurysm of unspecified site, without rupture: Secondary | ICD-10-CM | POA: Diagnosis not present

## 2023-06-24 DIAGNOSIS — G4733 Obstructive sleep apnea (adult) (pediatric): Secondary | ICD-10-CM | POA: Diagnosis not present

## 2023-06-24 DIAGNOSIS — G912 (Idiopathic) normal pressure hydrocephalus: Secondary | ICD-10-CM | POA: Diagnosis not present

## 2023-06-24 DIAGNOSIS — I69351 Hemiplegia and hemiparesis following cerebral infarction affecting right dominant side: Secondary | ICD-10-CM | POA: Diagnosis not present

## 2023-06-24 DIAGNOSIS — J439 Emphysema, unspecified: Secondary | ICD-10-CM | POA: Diagnosis not present

## 2023-06-24 DIAGNOSIS — J440 Chronic obstructive pulmonary disease with acute lower respiratory infection: Secondary | ICD-10-CM | POA: Diagnosis not present

## 2023-06-24 DIAGNOSIS — Z79899 Other long term (current) drug therapy: Secondary | ICD-10-CM | POA: Diagnosis not present

## 2023-06-24 DIAGNOSIS — Z556 Problems related to health literacy: Secondary | ICD-10-CM | POA: Diagnosis not present

## 2023-06-24 DIAGNOSIS — I10 Essential (primary) hypertension: Secondary | ICD-10-CM | POA: Diagnosis not present

## 2023-06-24 DIAGNOSIS — K219 Gastro-esophageal reflux disease without esophagitis: Secondary | ICD-10-CM | POA: Diagnosis not present

## 2023-06-24 DIAGNOSIS — Z604 Social exclusion and rejection: Secondary | ICD-10-CM | POA: Diagnosis not present

## 2023-06-24 DIAGNOSIS — R338 Other retention of urine: Secondary | ICD-10-CM | POA: Diagnosis not present

## 2023-06-24 DIAGNOSIS — F17210 Nicotine dependence, cigarettes, uncomplicated: Secondary | ICD-10-CM | POA: Diagnosis not present

## 2023-06-24 DIAGNOSIS — J9621 Acute and chronic respiratory failure with hypoxia: Secondary | ICD-10-CM | POA: Diagnosis not present

## 2023-06-24 DIAGNOSIS — E785 Hyperlipidemia, unspecified: Secondary | ICD-10-CM | POA: Diagnosis not present

## 2023-06-24 DIAGNOSIS — Z9981 Dependence on supplemental oxygen: Secondary | ICD-10-CM | POA: Diagnosis not present

## 2023-06-24 DIAGNOSIS — H8109 Meniere's disease, unspecified ear: Secondary | ICD-10-CM | POA: Diagnosis not present

## 2023-06-24 DIAGNOSIS — Z7951 Long term (current) use of inhaled steroids: Secondary | ICD-10-CM | POA: Diagnosis not present

## 2023-06-24 DIAGNOSIS — E1140 Type 2 diabetes mellitus with diabetic neuropathy, unspecified: Secondary | ICD-10-CM | POA: Diagnosis not present

## 2023-06-25 DIAGNOSIS — I719 Aortic aneurysm of unspecified site, without rupture: Secondary | ICD-10-CM | POA: Diagnosis not present

## 2023-06-25 DIAGNOSIS — Z7951 Long term (current) use of inhaled steroids: Secondary | ICD-10-CM | POA: Diagnosis not present

## 2023-06-25 DIAGNOSIS — K219 Gastro-esophageal reflux disease without esophagitis: Secondary | ICD-10-CM | POA: Diagnosis not present

## 2023-06-25 DIAGNOSIS — R338 Other retention of urine: Secondary | ICD-10-CM | POA: Diagnosis not present

## 2023-06-25 DIAGNOSIS — I10 Essential (primary) hypertension: Secondary | ICD-10-CM | POA: Diagnosis not present

## 2023-06-25 DIAGNOSIS — Z9981 Dependence on supplemental oxygen: Secondary | ICD-10-CM | POA: Diagnosis not present

## 2023-06-25 DIAGNOSIS — G912 (Idiopathic) normal pressure hydrocephalus: Secondary | ICD-10-CM | POA: Diagnosis not present

## 2023-06-25 DIAGNOSIS — Z604 Social exclusion and rejection: Secondary | ICD-10-CM | POA: Diagnosis not present

## 2023-06-25 DIAGNOSIS — Z79899 Other long term (current) drug therapy: Secondary | ICD-10-CM | POA: Diagnosis not present

## 2023-06-25 DIAGNOSIS — G4733 Obstructive sleep apnea (adult) (pediatric): Secondary | ICD-10-CM | POA: Diagnosis not present

## 2023-06-25 DIAGNOSIS — J440 Chronic obstructive pulmonary disease with acute lower respiratory infection: Secondary | ICD-10-CM | POA: Diagnosis not present

## 2023-06-25 DIAGNOSIS — F17210 Nicotine dependence, cigarettes, uncomplicated: Secondary | ICD-10-CM | POA: Diagnosis not present

## 2023-06-25 DIAGNOSIS — H8109 Meniere's disease, unspecified ear: Secondary | ICD-10-CM | POA: Diagnosis not present

## 2023-06-25 DIAGNOSIS — E1140 Type 2 diabetes mellitus with diabetic neuropathy, unspecified: Secondary | ICD-10-CM | POA: Diagnosis not present

## 2023-06-25 DIAGNOSIS — Z556 Problems related to health literacy: Secondary | ICD-10-CM | POA: Diagnosis not present

## 2023-06-25 DIAGNOSIS — E785 Hyperlipidemia, unspecified: Secondary | ICD-10-CM | POA: Diagnosis not present

## 2023-06-25 DIAGNOSIS — J9621 Acute and chronic respiratory failure with hypoxia: Secondary | ICD-10-CM | POA: Diagnosis not present

## 2023-06-25 DIAGNOSIS — J439 Emphysema, unspecified: Secondary | ICD-10-CM | POA: Diagnosis not present

## 2023-06-25 DIAGNOSIS — I69351 Hemiplegia and hemiparesis following cerebral infarction affecting right dominant side: Secondary | ICD-10-CM | POA: Diagnosis not present

## 2023-06-28 DIAGNOSIS — J9621 Acute and chronic respiratory failure with hypoxia: Secondary | ICD-10-CM | POA: Diagnosis not present

## 2023-06-28 DIAGNOSIS — I69351 Hemiplegia and hemiparesis following cerebral infarction affecting right dominant side: Secondary | ICD-10-CM | POA: Diagnosis not present

## 2023-06-28 DIAGNOSIS — Z604 Social exclusion and rejection: Secondary | ICD-10-CM | POA: Diagnosis not present

## 2023-06-28 DIAGNOSIS — I10 Essential (primary) hypertension: Secondary | ICD-10-CM | POA: Diagnosis not present

## 2023-06-28 DIAGNOSIS — G912 (Idiopathic) normal pressure hydrocephalus: Secondary | ICD-10-CM | POA: Diagnosis not present

## 2023-06-28 DIAGNOSIS — K219 Gastro-esophageal reflux disease without esophagitis: Secondary | ICD-10-CM | POA: Diagnosis not present

## 2023-06-28 DIAGNOSIS — Z556 Problems related to health literacy: Secondary | ICD-10-CM | POA: Diagnosis not present

## 2023-06-28 DIAGNOSIS — G4733 Obstructive sleep apnea (adult) (pediatric): Secondary | ICD-10-CM | POA: Diagnosis not present

## 2023-06-28 DIAGNOSIS — J440 Chronic obstructive pulmonary disease with acute lower respiratory infection: Secondary | ICD-10-CM | POA: Diagnosis not present

## 2023-06-28 DIAGNOSIS — Z79899 Other long term (current) drug therapy: Secondary | ICD-10-CM | POA: Diagnosis not present

## 2023-06-28 DIAGNOSIS — F17210 Nicotine dependence, cigarettes, uncomplicated: Secondary | ICD-10-CM | POA: Diagnosis not present

## 2023-06-28 DIAGNOSIS — E785 Hyperlipidemia, unspecified: Secondary | ICD-10-CM | POA: Diagnosis not present

## 2023-06-28 DIAGNOSIS — Z9981 Dependence on supplemental oxygen: Secondary | ICD-10-CM | POA: Diagnosis not present

## 2023-06-28 DIAGNOSIS — Z7951 Long term (current) use of inhaled steroids: Secondary | ICD-10-CM | POA: Diagnosis not present

## 2023-06-28 DIAGNOSIS — J439 Emphysema, unspecified: Secondary | ICD-10-CM | POA: Diagnosis not present

## 2023-06-28 DIAGNOSIS — I719 Aortic aneurysm of unspecified site, without rupture: Secondary | ICD-10-CM | POA: Diagnosis not present

## 2023-06-28 DIAGNOSIS — H8109 Meniere's disease, unspecified ear: Secondary | ICD-10-CM | POA: Diagnosis not present

## 2023-06-28 DIAGNOSIS — E1140 Type 2 diabetes mellitus with diabetic neuropathy, unspecified: Secondary | ICD-10-CM | POA: Diagnosis not present

## 2023-06-28 DIAGNOSIS — R338 Other retention of urine: Secondary | ICD-10-CM | POA: Diagnosis not present

## 2023-06-30 DIAGNOSIS — Z556 Problems related to health literacy: Secondary | ICD-10-CM | POA: Diagnosis not present

## 2023-06-30 DIAGNOSIS — J9621 Acute and chronic respiratory failure with hypoxia: Secondary | ICD-10-CM | POA: Diagnosis not present

## 2023-06-30 DIAGNOSIS — F17210 Nicotine dependence, cigarettes, uncomplicated: Secondary | ICD-10-CM | POA: Diagnosis not present

## 2023-06-30 DIAGNOSIS — G912 (Idiopathic) normal pressure hydrocephalus: Secondary | ICD-10-CM | POA: Diagnosis not present

## 2023-06-30 DIAGNOSIS — I719 Aortic aneurysm of unspecified site, without rupture: Secondary | ICD-10-CM | POA: Diagnosis not present

## 2023-06-30 DIAGNOSIS — Z604 Social exclusion and rejection: Secondary | ICD-10-CM | POA: Diagnosis not present

## 2023-06-30 DIAGNOSIS — E785 Hyperlipidemia, unspecified: Secondary | ICD-10-CM | POA: Diagnosis not present

## 2023-06-30 DIAGNOSIS — I10 Essential (primary) hypertension: Secondary | ICD-10-CM | POA: Diagnosis not present

## 2023-06-30 DIAGNOSIS — G4733 Obstructive sleep apnea (adult) (pediatric): Secondary | ICD-10-CM | POA: Diagnosis not present

## 2023-06-30 DIAGNOSIS — R338 Other retention of urine: Secondary | ICD-10-CM | POA: Diagnosis not present

## 2023-06-30 DIAGNOSIS — K219 Gastro-esophageal reflux disease without esophagitis: Secondary | ICD-10-CM | POA: Diagnosis not present

## 2023-06-30 DIAGNOSIS — J439 Emphysema, unspecified: Secondary | ICD-10-CM | POA: Diagnosis not present

## 2023-06-30 DIAGNOSIS — Z9981 Dependence on supplemental oxygen: Secondary | ICD-10-CM | POA: Diagnosis not present

## 2023-06-30 DIAGNOSIS — Z79899 Other long term (current) drug therapy: Secondary | ICD-10-CM | POA: Diagnosis not present

## 2023-06-30 DIAGNOSIS — H8109 Meniere's disease, unspecified ear: Secondary | ICD-10-CM | POA: Diagnosis not present

## 2023-06-30 DIAGNOSIS — J440 Chronic obstructive pulmonary disease with acute lower respiratory infection: Secondary | ICD-10-CM | POA: Diagnosis not present

## 2023-06-30 DIAGNOSIS — Z7951 Long term (current) use of inhaled steroids: Secondary | ICD-10-CM | POA: Diagnosis not present

## 2023-06-30 DIAGNOSIS — E1140 Type 2 diabetes mellitus with diabetic neuropathy, unspecified: Secondary | ICD-10-CM | POA: Diagnosis not present

## 2023-06-30 DIAGNOSIS — I69351 Hemiplegia and hemiparesis following cerebral infarction affecting right dominant side: Secondary | ICD-10-CM | POA: Diagnosis not present

## 2023-07-01 DIAGNOSIS — I10 Essential (primary) hypertension: Secondary | ICD-10-CM | POA: Diagnosis not present

## 2023-07-01 DIAGNOSIS — Z604 Social exclusion and rejection: Secondary | ICD-10-CM | POA: Diagnosis not present

## 2023-07-01 DIAGNOSIS — J439 Emphysema, unspecified: Secondary | ICD-10-CM | POA: Diagnosis not present

## 2023-07-01 DIAGNOSIS — K219 Gastro-esophageal reflux disease without esophagitis: Secondary | ICD-10-CM | POA: Diagnosis not present

## 2023-07-01 DIAGNOSIS — H8109 Meniere's disease, unspecified ear: Secondary | ICD-10-CM | POA: Diagnosis not present

## 2023-07-01 DIAGNOSIS — E1140 Type 2 diabetes mellitus with diabetic neuropathy, unspecified: Secondary | ICD-10-CM | POA: Diagnosis not present

## 2023-07-01 DIAGNOSIS — R338 Other retention of urine: Secondary | ICD-10-CM | POA: Diagnosis not present

## 2023-07-01 DIAGNOSIS — E785 Hyperlipidemia, unspecified: Secondary | ICD-10-CM | POA: Diagnosis not present

## 2023-07-01 DIAGNOSIS — J440 Chronic obstructive pulmonary disease with acute lower respiratory infection: Secondary | ICD-10-CM | POA: Diagnosis not present

## 2023-07-01 DIAGNOSIS — Z79899 Other long term (current) drug therapy: Secondary | ICD-10-CM | POA: Diagnosis not present

## 2023-07-01 DIAGNOSIS — Z7951 Long term (current) use of inhaled steroids: Secondary | ICD-10-CM | POA: Diagnosis not present

## 2023-07-01 DIAGNOSIS — Z556 Problems related to health literacy: Secondary | ICD-10-CM | POA: Diagnosis not present

## 2023-07-01 DIAGNOSIS — F17210 Nicotine dependence, cigarettes, uncomplicated: Secondary | ICD-10-CM | POA: Diagnosis not present

## 2023-07-01 DIAGNOSIS — G4733 Obstructive sleep apnea (adult) (pediatric): Secondary | ICD-10-CM | POA: Diagnosis not present

## 2023-07-01 DIAGNOSIS — I69351 Hemiplegia and hemiparesis following cerebral infarction affecting right dominant side: Secondary | ICD-10-CM | POA: Diagnosis not present

## 2023-07-01 DIAGNOSIS — Z9981 Dependence on supplemental oxygen: Secondary | ICD-10-CM | POA: Diagnosis not present

## 2023-07-01 DIAGNOSIS — G912 (Idiopathic) normal pressure hydrocephalus: Secondary | ICD-10-CM | POA: Diagnosis not present

## 2023-07-01 DIAGNOSIS — I719 Aortic aneurysm of unspecified site, without rupture: Secondary | ICD-10-CM | POA: Diagnosis not present

## 2023-07-01 DIAGNOSIS — J9621 Acute and chronic respiratory failure with hypoxia: Secondary | ICD-10-CM | POA: Diagnosis not present

## 2023-07-05 DIAGNOSIS — I10 Essential (primary) hypertension: Secondary | ICD-10-CM | POA: Diagnosis not present

## 2023-07-05 DIAGNOSIS — N3000 Acute cystitis without hematuria: Secondary | ICD-10-CM | POA: Diagnosis not present

## 2023-07-05 DIAGNOSIS — J439 Emphysema, unspecified: Secondary | ICD-10-CM | POA: Diagnosis not present

## 2023-07-05 DIAGNOSIS — R7302 Impaired glucose tolerance (oral): Secondary | ICD-10-CM | POA: Diagnosis not present

## 2023-07-05 DIAGNOSIS — Z79899 Other long term (current) drug therapy: Secondary | ICD-10-CM | POA: Diagnosis not present

## 2023-07-06 DIAGNOSIS — Z7951 Long term (current) use of inhaled steroids: Secondary | ICD-10-CM | POA: Diagnosis not present

## 2023-07-06 DIAGNOSIS — Z79899 Other long term (current) drug therapy: Secondary | ICD-10-CM | POA: Diagnosis not present

## 2023-07-06 DIAGNOSIS — G4733 Obstructive sleep apnea (adult) (pediatric): Secondary | ICD-10-CM | POA: Diagnosis not present

## 2023-07-06 DIAGNOSIS — I719 Aortic aneurysm of unspecified site, without rupture: Secondary | ICD-10-CM | POA: Diagnosis not present

## 2023-07-06 DIAGNOSIS — Z9981 Dependence on supplemental oxygen: Secondary | ICD-10-CM | POA: Diagnosis not present

## 2023-07-06 DIAGNOSIS — E785 Hyperlipidemia, unspecified: Secondary | ICD-10-CM | POA: Diagnosis not present

## 2023-07-06 DIAGNOSIS — K219 Gastro-esophageal reflux disease without esophagitis: Secondary | ICD-10-CM | POA: Diagnosis not present

## 2023-07-06 DIAGNOSIS — G912 (Idiopathic) normal pressure hydrocephalus: Secondary | ICD-10-CM | POA: Diagnosis not present

## 2023-07-06 DIAGNOSIS — F17210 Nicotine dependence, cigarettes, uncomplicated: Secondary | ICD-10-CM | POA: Diagnosis not present

## 2023-07-06 DIAGNOSIS — J439 Emphysema, unspecified: Secondary | ICD-10-CM | POA: Diagnosis not present

## 2023-07-06 DIAGNOSIS — I69351 Hemiplegia and hemiparesis following cerebral infarction affecting right dominant side: Secondary | ICD-10-CM | POA: Diagnosis not present

## 2023-07-06 DIAGNOSIS — Z556 Problems related to health literacy: Secondary | ICD-10-CM | POA: Diagnosis not present

## 2023-07-06 DIAGNOSIS — R338 Other retention of urine: Secondary | ICD-10-CM | POA: Diagnosis not present

## 2023-07-06 DIAGNOSIS — J9621 Acute and chronic respiratory failure with hypoxia: Secondary | ICD-10-CM | POA: Diagnosis not present

## 2023-07-06 DIAGNOSIS — I10 Essential (primary) hypertension: Secondary | ICD-10-CM | POA: Diagnosis not present

## 2023-07-06 DIAGNOSIS — E1140 Type 2 diabetes mellitus with diabetic neuropathy, unspecified: Secondary | ICD-10-CM | POA: Diagnosis not present

## 2023-07-06 DIAGNOSIS — Z604 Social exclusion and rejection: Secondary | ICD-10-CM | POA: Diagnosis not present

## 2023-07-06 DIAGNOSIS — J440 Chronic obstructive pulmonary disease with acute lower respiratory infection: Secondary | ICD-10-CM | POA: Diagnosis not present

## 2023-07-06 DIAGNOSIS — H8109 Meniere's disease, unspecified ear: Secondary | ICD-10-CM | POA: Diagnosis not present

## 2023-07-12 DIAGNOSIS — J9621 Acute and chronic respiratory failure with hypoxia: Secondary | ICD-10-CM | POA: Diagnosis not present

## 2023-07-12 DIAGNOSIS — F17210 Nicotine dependence, cigarettes, uncomplicated: Secondary | ICD-10-CM | POA: Diagnosis not present

## 2023-07-12 DIAGNOSIS — E785 Hyperlipidemia, unspecified: Secondary | ICD-10-CM | POA: Diagnosis not present

## 2023-07-12 DIAGNOSIS — J439 Emphysema, unspecified: Secondary | ICD-10-CM | POA: Diagnosis not present

## 2023-07-12 DIAGNOSIS — Z7951 Long term (current) use of inhaled steroids: Secondary | ICD-10-CM | POA: Diagnosis not present

## 2023-07-12 DIAGNOSIS — E1140 Type 2 diabetes mellitus with diabetic neuropathy, unspecified: Secondary | ICD-10-CM | POA: Diagnosis not present

## 2023-07-12 DIAGNOSIS — Z604 Social exclusion and rejection: Secondary | ICD-10-CM | POA: Diagnosis not present

## 2023-07-12 DIAGNOSIS — I10 Essential (primary) hypertension: Secondary | ICD-10-CM | POA: Diagnosis not present

## 2023-07-12 DIAGNOSIS — H8109 Meniere's disease, unspecified ear: Secondary | ICD-10-CM | POA: Diagnosis not present

## 2023-07-12 DIAGNOSIS — Z79899 Other long term (current) drug therapy: Secondary | ICD-10-CM | POA: Diagnosis not present

## 2023-07-12 DIAGNOSIS — I719 Aortic aneurysm of unspecified site, without rupture: Secondary | ICD-10-CM | POA: Diagnosis not present

## 2023-07-12 DIAGNOSIS — Z9981 Dependence on supplemental oxygen: Secondary | ICD-10-CM | POA: Diagnosis not present

## 2023-07-12 DIAGNOSIS — I69351 Hemiplegia and hemiparesis following cerebral infarction affecting right dominant side: Secondary | ICD-10-CM | POA: Diagnosis not present

## 2023-07-12 DIAGNOSIS — G912 (Idiopathic) normal pressure hydrocephalus: Secondary | ICD-10-CM | POA: Diagnosis not present

## 2023-07-12 DIAGNOSIS — G4733 Obstructive sleep apnea (adult) (pediatric): Secondary | ICD-10-CM | POA: Diagnosis not present

## 2023-07-12 DIAGNOSIS — R338 Other retention of urine: Secondary | ICD-10-CM | POA: Diagnosis not present

## 2023-07-12 DIAGNOSIS — K219 Gastro-esophageal reflux disease without esophagitis: Secondary | ICD-10-CM | POA: Diagnosis not present

## 2023-07-12 DIAGNOSIS — Z556 Problems related to health literacy: Secondary | ICD-10-CM | POA: Diagnosis not present

## 2023-07-12 DIAGNOSIS — J440 Chronic obstructive pulmonary disease with acute lower respiratory infection: Secondary | ICD-10-CM | POA: Diagnosis not present

## 2023-07-13 DIAGNOSIS — J9621 Acute and chronic respiratory failure with hypoxia: Secondary | ICD-10-CM | POA: Diagnosis not present

## 2023-07-13 DIAGNOSIS — Z604 Social exclusion and rejection: Secondary | ICD-10-CM | POA: Diagnosis not present

## 2023-07-13 DIAGNOSIS — G4733 Obstructive sleep apnea (adult) (pediatric): Secondary | ICD-10-CM | POA: Diagnosis not present

## 2023-07-13 DIAGNOSIS — Z556 Problems related to health literacy: Secondary | ICD-10-CM | POA: Diagnosis not present

## 2023-07-13 DIAGNOSIS — E785 Hyperlipidemia, unspecified: Secondary | ICD-10-CM | POA: Diagnosis not present

## 2023-07-13 DIAGNOSIS — I719 Aortic aneurysm of unspecified site, without rupture: Secondary | ICD-10-CM | POA: Diagnosis not present

## 2023-07-13 DIAGNOSIS — E1140 Type 2 diabetes mellitus with diabetic neuropathy, unspecified: Secondary | ICD-10-CM | POA: Diagnosis not present

## 2023-07-13 DIAGNOSIS — G912 (Idiopathic) normal pressure hydrocephalus: Secondary | ICD-10-CM | POA: Diagnosis not present

## 2023-07-13 DIAGNOSIS — I10 Essential (primary) hypertension: Secondary | ICD-10-CM | POA: Diagnosis not present

## 2023-07-13 DIAGNOSIS — J439 Emphysema, unspecified: Secondary | ICD-10-CM | POA: Diagnosis not present

## 2023-07-13 DIAGNOSIS — R059 Cough, unspecified: Secondary | ICD-10-CM | POA: Diagnosis not present

## 2023-07-13 DIAGNOSIS — H8109 Meniere's disease, unspecified ear: Secondary | ICD-10-CM | POA: Diagnosis not present

## 2023-07-13 DIAGNOSIS — Z9981 Dependence on supplemental oxygen: Secondary | ICD-10-CM | POA: Diagnosis not present

## 2023-07-13 DIAGNOSIS — J440 Chronic obstructive pulmonary disease with acute lower respiratory infection: Secondary | ICD-10-CM | POA: Diagnosis not present

## 2023-07-13 DIAGNOSIS — F17210 Nicotine dependence, cigarettes, uncomplicated: Secondary | ICD-10-CM | POA: Diagnosis not present

## 2023-07-13 DIAGNOSIS — K219 Gastro-esophageal reflux disease without esophagitis: Secondary | ICD-10-CM | POA: Diagnosis not present

## 2023-07-13 DIAGNOSIS — Z7951 Long term (current) use of inhaled steroids: Secondary | ICD-10-CM | POA: Diagnosis not present

## 2023-07-13 DIAGNOSIS — Z79899 Other long term (current) drug therapy: Secondary | ICD-10-CM | POA: Diagnosis not present

## 2023-07-13 DIAGNOSIS — I69351 Hemiplegia and hemiparesis following cerebral infarction affecting right dominant side: Secondary | ICD-10-CM | POA: Diagnosis not present

## 2023-07-13 DIAGNOSIS — R338 Other retention of urine: Secondary | ICD-10-CM | POA: Diagnosis not present

## 2023-07-15 DIAGNOSIS — G912 (Idiopathic) normal pressure hydrocephalus: Secondary | ICD-10-CM | POA: Diagnosis not present

## 2023-07-15 DIAGNOSIS — Z9981 Dependence on supplemental oxygen: Secondary | ICD-10-CM | POA: Diagnosis not present

## 2023-07-15 DIAGNOSIS — G4733 Obstructive sleep apnea (adult) (pediatric): Secondary | ICD-10-CM | POA: Diagnosis not present

## 2023-07-15 DIAGNOSIS — Z79899 Other long term (current) drug therapy: Secondary | ICD-10-CM | POA: Diagnosis not present

## 2023-07-15 DIAGNOSIS — I719 Aortic aneurysm of unspecified site, without rupture: Secondary | ICD-10-CM | POA: Diagnosis not present

## 2023-07-15 DIAGNOSIS — E1140 Type 2 diabetes mellitus with diabetic neuropathy, unspecified: Secondary | ICD-10-CM | POA: Diagnosis not present

## 2023-07-15 DIAGNOSIS — R338 Other retention of urine: Secondary | ICD-10-CM | POA: Diagnosis not present

## 2023-07-15 DIAGNOSIS — Z556 Problems related to health literacy: Secondary | ICD-10-CM | POA: Diagnosis not present

## 2023-07-15 DIAGNOSIS — K219 Gastro-esophageal reflux disease without esophagitis: Secondary | ICD-10-CM | POA: Diagnosis not present

## 2023-07-15 DIAGNOSIS — Z604 Social exclusion and rejection: Secondary | ICD-10-CM | POA: Diagnosis not present

## 2023-07-15 DIAGNOSIS — I10 Essential (primary) hypertension: Secondary | ICD-10-CM | POA: Diagnosis not present

## 2023-07-15 DIAGNOSIS — E785 Hyperlipidemia, unspecified: Secondary | ICD-10-CM | POA: Diagnosis not present

## 2023-07-15 DIAGNOSIS — J439 Emphysema, unspecified: Secondary | ICD-10-CM | POA: Diagnosis not present

## 2023-07-15 DIAGNOSIS — I69351 Hemiplegia and hemiparesis following cerebral infarction affecting right dominant side: Secondary | ICD-10-CM | POA: Diagnosis not present

## 2023-07-15 DIAGNOSIS — H8109 Meniere's disease, unspecified ear: Secondary | ICD-10-CM | POA: Diagnosis not present

## 2023-07-15 DIAGNOSIS — J9621 Acute and chronic respiratory failure with hypoxia: Secondary | ICD-10-CM | POA: Diagnosis not present

## 2023-07-15 DIAGNOSIS — J440 Chronic obstructive pulmonary disease with acute lower respiratory infection: Secondary | ICD-10-CM | POA: Diagnosis not present

## 2023-07-15 DIAGNOSIS — F17210 Nicotine dependence, cigarettes, uncomplicated: Secondary | ICD-10-CM | POA: Diagnosis not present

## 2023-07-15 DIAGNOSIS — Z7951 Long term (current) use of inhaled steroids: Secondary | ICD-10-CM | POA: Diagnosis not present

## 2023-07-16 DIAGNOSIS — E1140 Type 2 diabetes mellitus with diabetic neuropathy, unspecified: Secondary | ICD-10-CM | POA: Diagnosis not present

## 2023-07-16 DIAGNOSIS — Z9981 Dependence on supplemental oxygen: Secondary | ICD-10-CM | POA: Diagnosis not present

## 2023-07-16 DIAGNOSIS — J440 Chronic obstructive pulmonary disease with acute lower respiratory infection: Secondary | ICD-10-CM | POA: Diagnosis not present

## 2023-07-16 DIAGNOSIS — Z604 Social exclusion and rejection: Secondary | ICD-10-CM | POA: Diagnosis not present

## 2023-07-16 DIAGNOSIS — R338 Other retention of urine: Secondary | ICD-10-CM | POA: Diagnosis not present

## 2023-07-16 DIAGNOSIS — H8109 Meniere's disease, unspecified ear: Secondary | ICD-10-CM | POA: Diagnosis not present

## 2023-07-16 DIAGNOSIS — K219 Gastro-esophageal reflux disease without esophagitis: Secondary | ICD-10-CM | POA: Diagnosis not present

## 2023-07-16 DIAGNOSIS — I10 Essential (primary) hypertension: Secondary | ICD-10-CM | POA: Diagnosis not present

## 2023-07-16 DIAGNOSIS — J439 Emphysema, unspecified: Secondary | ICD-10-CM | POA: Diagnosis not present

## 2023-07-16 DIAGNOSIS — Z79899 Other long term (current) drug therapy: Secondary | ICD-10-CM | POA: Diagnosis not present

## 2023-07-16 DIAGNOSIS — F17210 Nicotine dependence, cigarettes, uncomplicated: Secondary | ICD-10-CM | POA: Diagnosis not present

## 2023-07-16 DIAGNOSIS — Z7951 Long term (current) use of inhaled steroids: Secondary | ICD-10-CM | POA: Diagnosis not present

## 2023-07-16 DIAGNOSIS — J9621 Acute and chronic respiratory failure with hypoxia: Secondary | ICD-10-CM | POA: Diagnosis not present

## 2023-07-16 DIAGNOSIS — G4733 Obstructive sleep apnea (adult) (pediatric): Secondary | ICD-10-CM | POA: Diagnosis not present

## 2023-07-16 DIAGNOSIS — E785 Hyperlipidemia, unspecified: Secondary | ICD-10-CM | POA: Diagnosis not present

## 2023-07-16 DIAGNOSIS — G912 (Idiopathic) normal pressure hydrocephalus: Secondary | ICD-10-CM | POA: Diagnosis not present

## 2023-07-16 DIAGNOSIS — Z556 Problems related to health literacy: Secondary | ICD-10-CM | POA: Diagnosis not present

## 2023-07-16 DIAGNOSIS — I719 Aortic aneurysm of unspecified site, without rupture: Secondary | ICD-10-CM | POA: Diagnosis not present

## 2023-07-16 DIAGNOSIS — I69351 Hemiplegia and hemiparesis following cerebral infarction affecting right dominant side: Secondary | ICD-10-CM | POA: Diagnosis not present

## 2023-07-21 DIAGNOSIS — Z9981 Dependence on supplemental oxygen: Secondary | ICD-10-CM | POA: Diagnosis not present

## 2023-07-21 DIAGNOSIS — R338 Other retention of urine: Secondary | ICD-10-CM | POA: Diagnosis not present

## 2023-07-21 DIAGNOSIS — H8109 Meniere's disease, unspecified ear: Secondary | ICD-10-CM | POA: Diagnosis not present

## 2023-07-21 DIAGNOSIS — J439 Emphysema, unspecified: Secondary | ICD-10-CM | POA: Diagnosis not present

## 2023-07-21 DIAGNOSIS — E785 Hyperlipidemia, unspecified: Secondary | ICD-10-CM | POA: Diagnosis not present

## 2023-07-21 DIAGNOSIS — I69351 Hemiplegia and hemiparesis following cerebral infarction affecting right dominant side: Secondary | ICD-10-CM | POA: Diagnosis not present

## 2023-07-21 DIAGNOSIS — Z7951 Long term (current) use of inhaled steroids: Secondary | ICD-10-CM | POA: Diagnosis not present

## 2023-07-21 DIAGNOSIS — Z79899 Other long term (current) drug therapy: Secondary | ICD-10-CM | POA: Diagnosis not present

## 2023-07-21 DIAGNOSIS — G4733 Obstructive sleep apnea (adult) (pediatric): Secondary | ICD-10-CM | POA: Diagnosis not present

## 2023-07-21 DIAGNOSIS — G912 (Idiopathic) normal pressure hydrocephalus: Secondary | ICD-10-CM | POA: Diagnosis not present

## 2023-07-21 DIAGNOSIS — I719 Aortic aneurysm of unspecified site, without rupture: Secondary | ICD-10-CM | POA: Diagnosis not present

## 2023-07-21 DIAGNOSIS — E1140 Type 2 diabetes mellitus with diabetic neuropathy, unspecified: Secondary | ICD-10-CM | POA: Diagnosis not present

## 2023-07-21 DIAGNOSIS — K219 Gastro-esophageal reflux disease without esophagitis: Secondary | ICD-10-CM | POA: Diagnosis not present

## 2023-07-21 DIAGNOSIS — I10 Essential (primary) hypertension: Secondary | ICD-10-CM | POA: Diagnosis not present

## 2023-07-21 DIAGNOSIS — Z604 Social exclusion and rejection: Secondary | ICD-10-CM | POA: Diagnosis not present

## 2023-07-21 DIAGNOSIS — F17210 Nicotine dependence, cigarettes, uncomplicated: Secondary | ICD-10-CM | POA: Diagnosis not present

## 2023-07-21 DIAGNOSIS — J440 Chronic obstructive pulmonary disease with acute lower respiratory infection: Secondary | ICD-10-CM | POA: Diagnosis not present

## 2023-07-21 DIAGNOSIS — Z556 Problems related to health literacy: Secondary | ICD-10-CM | POA: Diagnosis not present

## 2023-07-21 DIAGNOSIS — J9621 Acute and chronic respiratory failure with hypoxia: Secondary | ICD-10-CM | POA: Diagnosis not present

## 2023-07-23 DIAGNOSIS — Z9981 Dependence on supplemental oxygen: Secondary | ICD-10-CM | POA: Diagnosis not present

## 2023-07-23 DIAGNOSIS — R338 Other retention of urine: Secondary | ICD-10-CM | POA: Diagnosis not present

## 2023-07-23 DIAGNOSIS — I10 Essential (primary) hypertension: Secondary | ICD-10-CM | POA: Diagnosis not present

## 2023-07-23 DIAGNOSIS — J439 Emphysema, unspecified: Secondary | ICD-10-CM | POA: Diagnosis not present

## 2023-07-23 DIAGNOSIS — F17210 Nicotine dependence, cigarettes, uncomplicated: Secondary | ICD-10-CM | POA: Diagnosis not present

## 2023-07-23 DIAGNOSIS — I719 Aortic aneurysm of unspecified site, without rupture: Secondary | ICD-10-CM | POA: Diagnosis not present

## 2023-07-23 DIAGNOSIS — K219 Gastro-esophageal reflux disease without esophagitis: Secondary | ICD-10-CM | POA: Diagnosis not present

## 2023-07-23 DIAGNOSIS — E785 Hyperlipidemia, unspecified: Secondary | ICD-10-CM | POA: Diagnosis not present

## 2023-07-23 DIAGNOSIS — E1140 Type 2 diabetes mellitus with diabetic neuropathy, unspecified: Secondary | ICD-10-CM | POA: Diagnosis not present

## 2023-07-23 DIAGNOSIS — Z604 Social exclusion and rejection: Secondary | ICD-10-CM | POA: Diagnosis not present

## 2023-07-23 DIAGNOSIS — Z556 Problems related to health literacy: Secondary | ICD-10-CM | POA: Diagnosis not present

## 2023-07-23 DIAGNOSIS — Z79899 Other long term (current) drug therapy: Secondary | ICD-10-CM | POA: Diagnosis not present

## 2023-07-23 DIAGNOSIS — G4733 Obstructive sleep apnea (adult) (pediatric): Secondary | ICD-10-CM | POA: Diagnosis not present

## 2023-07-23 DIAGNOSIS — G912 (Idiopathic) normal pressure hydrocephalus: Secondary | ICD-10-CM | POA: Diagnosis not present

## 2023-07-23 DIAGNOSIS — I69351 Hemiplegia and hemiparesis following cerebral infarction affecting right dominant side: Secondary | ICD-10-CM | POA: Diagnosis not present

## 2023-07-23 DIAGNOSIS — H8109 Meniere's disease, unspecified ear: Secondary | ICD-10-CM | POA: Diagnosis not present

## 2023-07-23 DIAGNOSIS — J440 Chronic obstructive pulmonary disease with acute lower respiratory infection: Secondary | ICD-10-CM | POA: Diagnosis not present

## 2023-07-23 DIAGNOSIS — Z7951 Long term (current) use of inhaled steroids: Secondary | ICD-10-CM | POA: Diagnosis not present

## 2023-07-23 DIAGNOSIS — J9621 Acute and chronic respiratory failure with hypoxia: Secondary | ICD-10-CM | POA: Diagnosis not present

## 2023-07-26 DIAGNOSIS — F17210 Nicotine dependence, cigarettes, uncomplicated: Secondary | ICD-10-CM | POA: Diagnosis not present

## 2023-07-26 DIAGNOSIS — G912 (Idiopathic) normal pressure hydrocephalus: Secondary | ICD-10-CM | POA: Diagnosis not present

## 2023-07-26 DIAGNOSIS — J9621 Acute and chronic respiratory failure with hypoxia: Secondary | ICD-10-CM | POA: Diagnosis not present

## 2023-07-26 DIAGNOSIS — E1140 Type 2 diabetes mellitus with diabetic neuropathy, unspecified: Secondary | ICD-10-CM | POA: Diagnosis not present

## 2023-07-26 DIAGNOSIS — J440 Chronic obstructive pulmonary disease with acute lower respiratory infection: Secondary | ICD-10-CM | POA: Diagnosis not present

## 2023-07-26 DIAGNOSIS — Z556 Problems related to health literacy: Secondary | ICD-10-CM | POA: Diagnosis not present

## 2023-07-26 DIAGNOSIS — Z604 Social exclusion and rejection: Secondary | ICD-10-CM | POA: Diagnosis not present

## 2023-07-26 DIAGNOSIS — H8109 Meniere's disease, unspecified ear: Secondary | ICD-10-CM | POA: Diagnosis not present

## 2023-07-26 DIAGNOSIS — Z79899 Other long term (current) drug therapy: Secondary | ICD-10-CM | POA: Diagnosis not present

## 2023-07-26 DIAGNOSIS — I719 Aortic aneurysm of unspecified site, without rupture: Secondary | ICD-10-CM | POA: Diagnosis not present

## 2023-07-26 DIAGNOSIS — E785 Hyperlipidemia, unspecified: Secondary | ICD-10-CM | POA: Diagnosis not present

## 2023-07-26 DIAGNOSIS — I69351 Hemiplegia and hemiparesis following cerebral infarction affecting right dominant side: Secondary | ICD-10-CM | POA: Diagnosis not present

## 2023-07-26 DIAGNOSIS — R338 Other retention of urine: Secondary | ICD-10-CM | POA: Diagnosis not present

## 2023-07-26 DIAGNOSIS — I10 Essential (primary) hypertension: Secondary | ICD-10-CM | POA: Diagnosis not present

## 2023-07-26 DIAGNOSIS — G4733 Obstructive sleep apnea (adult) (pediatric): Secondary | ICD-10-CM | POA: Diagnosis not present

## 2023-07-26 DIAGNOSIS — Z9981 Dependence on supplemental oxygen: Secondary | ICD-10-CM | POA: Diagnosis not present

## 2023-07-26 DIAGNOSIS — K219 Gastro-esophageal reflux disease without esophagitis: Secondary | ICD-10-CM | POA: Diagnosis not present

## 2023-07-26 DIAGNOSIS — J439 Emphysema, unspecified: Secondary | ICD-10-CM | POA: Diagnosis not present

## 2023-07-26 DIAGNOSIS — Z7951 Long term (current) use of inhaled steroids: Secondary | ICD-10-CM | POA: Diagnosis not present

## 2023-07-30 DIAGNOSIS — G4733 Obstructive sleep apnea (adult) (pediatric): Secondary | ICD-10-CM | POA: Diagnosis not present

## 2023-07-30 DIAGNOSIS — E1140 Type 2 diabetes mellitus with diabetic neuropathy, unspecified: Secondary | ICD-10-CM | POA: Diagnosis not present

## 2023-07-30 DIAGNOSIS — H8109 Meniere's disease, unspecified ear: Secondary | ICD-10-CM | POA: Diagnosis not present

## 2023-07-30 DIAGNOSIS — Z604 Social exclusion and rejection: Secondary | ICD-10-CM | POA: Diagnosis not present

## 2023-07-30 DIAGNOSIS — Z79899 Other long term (current) drug therapy: Secondary | ICD-10-CM | POA: Diagnosis not present

## 2023-07-30 DIAGNOSIS — E785 Hyperlipidemia, unspecified: Secondary | ICD-10-CM | POA: Diagnosis not present

## 2023-07-30 DIAGNOSIS — G912 (Idiopathic) normal pressure hydrocephalus: Secondary | ICD-10-CM | POA: Diagnosis not present

## 2023-07-30 DIAGNOSIS — Z9981 Dependence on supplemental oxygen: Secondary | ICD-10-CM | POA: Diagnosis not present

## 2023-07-30 DIAGNOSIS — I719 Aortic aneurysm of unspecified site, without rupture: Secondary | ICD-10-CM | POA: Diagnosis not present

## 2023-07-30 DIAGNOSIS — I10 Essential (primary) hypertension: Secondary | ICD-10-CM | POA: Diagnosis not present

## 2023-07-30 DIAGNOSIS — Z556 Problems related to health literacy: Secondary | ICD-10-CM | POA: Diagnosis not present

## 2023-07-30 DIAGNOSIS — K219 Gastro-esophageal reflux disease without esophagitis: Secondary | ICD-10-CM | POA: Diagnosis not present

## 2023-07-30 DIAGNOSIS — R338 Other retention of urine: Secondary | ICD-10-CM | POA: Diagnosis not present

## 2023-07-30 DIAGNOSIS — J440 Chronic obstructive pulmonary disease with acute lower respiratory infection: Secondary | ICD-10-CM | POA: Diagnosis not present

## 2023-07-30 DIAGNOSIS — J9621 Acute and chronic respiratory failure with hypoxia: Secondary | ICD-10-CM | POA: Diagnosis not present

## 2023-07-30 DIAGNOSIS — Z7951 Long term (current) use of inhaled steroids: Secondary | ICD-10-CM | POA: Diagnosis not present

## 2023-07-30 DIAGNOSIS — J439 Emphysema, unspecified: Secondary | ICD-10-CM | POA: Diagnosis not present

## 2023-07-30 DIAGNOSIS — I69351 Hemiplegia and hemiparesis following cerebral infarction affecting right dominant side: Secondary | ICD-10-CM | POA: Diagnosis not present

## 2023-07-30 DIAGNOSIS — F17210 Nicotine dependence, cigarettes, uncomplicated: Secondary | ICD-10-CM | POA: Diagnosis not present

## 2023-08-02 DIAGNOSIS — E876 Hypokalemia: Secondary | ICD-10-CM | POA: Diagnosis not present

## 2023-08-02 DIAGNOSIS — Z604 Social exclusion and rejection: Secondary | ICD-10-CM | POA: Diagnosis not present

## 2023-08-02 DIAGNOSIS — I10 Essential (primary) hypertension: Secondary | ICD-10-CM | POA: Diagnosis not present

## 2023-08-02 DIAGNOSIS — Z556 Problems related to health literacy: Secondary | ICD-10-CM | POA: Diagnosis not present

## 2023-08-02 DIAGNOSIS — G4733 Obstructive sleep apnea (adult) (pediatric): Secondary | ICD-10-CM | POA: Diagnosis not present

## 2023-08-02 DIAGNOSIS — J440 Chronic obstructive pulmonary disease with acute lower respiratory infection: Secondary | ICD-10-CM | POA: Diagnosis not present

## 2023-08-02 DIAGNOSIS — J9621 Acute and chronic respiratory failure with hypoxia: Secondary | ICD-10-CM | POA: Diagnosis not present

## 2023-08-02 DIAGNOSIS — E1140 Type 2 diabetes mellitus with diabetic neuropathy, unspecified: Secondary | ICD-10-CM | POA: Diagnosis not present

## 2023-08-02 DIAGNOSIS — N138 Other obstructive and reflux uropathy: Secondary | ICD-10-CM | POA: Diagnosis not present

## 2023-08-02 DIAGNOSIS — I69351 Hemiplegia and hemiparesis following cerebral infarction affecting right dominant side: Secondary | ICD-10-CM | POA: Diagnosis not present

## 2023-08-02 DIAGNOSIS — R338 Other retention of urine: Secondary | ICD-10-CM | POA: Diagnosis not present

## 2023-08-02 DIAGNOSIS — G912 (Idiopathic) normal pressure hydrocephalus: Secondary | ICD-10-CM | POA: Diagnosis not present

## 2023-08-02 DIAGNOSIS — Z7951 Long term (current) use of inhaled steroids: Secondary | ICD-10-CM | POA: Diagnosis not present

## 2023-08-02 DIAGNOSIS — H8109 Meniere's disease, unspecified ear: Secondary | ICD-10-CM | POA: Diagnosis not present

## 2023-08-02 DIAGNOSIS — Z9981 Dependence on supplemental oxygen: Secondary | ICD-10-CM | POA: Diagnosis not present

## 2023-08-02 DIAGNOSIS — J441 Chronic obstructive pulmonary disease with (acute) exacerbation: Secondary | ICD-10-CM | POA: Diagnosis not present

## 2023-08-02 DIAGNOSIS — K219 Gastro-esophageal reflux disease without esophagitis: Secondary | ICD-10-CM | POA: Diagnosis not present

## 2023-08-02 DIAGNOSIS — J9600 Acute respiratory failure, unspecified whether with hypoxia or hypercapnia: Secondary | ICD-10-CM | POA: Diagnosis not present

## 2023-08-02 DIAGNOSIS — I719 Aortic aneurysm of unspecified site, without rupture: Secondary | ICD-10-CM | POA: Diagnosis not present

## 2023-08-02 DIAGNOSIS — Z79899 Other long term (current) drug therapy: Secondary | ICD-10-CM | POA: Diagnosis not present

## 2023-08-02 DIAGNOSIS — E785 Hyperlipidemia, unspecified: Secondary | ICD-10-CM | POA: Diagnosis not present

## 2023-08-02 DIAGNOSIS — J439 Emphysema, unspecified: Secondary | ICD-10-CM | POA: Diagnosis not present

## 2023-08-02 DIAGNOSIS — F17210 Nicotine dependence, cigarettes, uncomplicated: Secondary | ICD-10-CM | POA: Diagnosis not present

## 2023-08-03 DIAGNOSIS — Z7951 Long term (current) use of inhaled steroids: Secondary | ICD-10-CM | POA: Diagnosis not present

## 2023-08-03 DIAGNOSIS — J9621 Acute and chronic respiratory failure with hypoxia: Secondary | ICD-10-CM | POA: Diagnosis not present

## 2023-08-03 DIAGNOSIS — Z9981 Dependence on supplemental oxygen: Secondary | ICD-10-CM | POA: Diagnosis not present

## 2023-08-03 DIAGNOSIS — J440 Chronic obstructive pulmonary disease with acute lower respiratory infection: Secondary | ICD-10-CM | POA: Diagnosis not present

## 2023-08-03 DIAGNOSIS — Z556 Problems related to health literacy: Secondary | ICD-10-CM | POA: Diagnosis not present

## 2023-08-03 DIAGNOSIS — Z604 Social exclusion and rejection: Secondary | ICD-10-CM | POA: Diagnosis not present

## 2023-08-03 DIAGNOSIS — I10 Essential (primary) hypertension: Secondary | ICD-10-CM | POA: Diagnosis not present

## 2023-08-03 DIAGNOSIS — K219 Gastro-esophageal reflux disease without esophagitis: Secondary | ICD-10-CM | POA: Diagnosis not present

## 2023-08-03 DIAGNOSIS — E785 Hyperlipidemia, unspecified: Secondary | ICD-10-CM | POA: Diagnosis not present

## 2023-08-03 DIAGNOSIS — E1140 Type 2 diabetes mellitus with diabetic neuropathy, unspecified: Secondary | ICD-10-CM | POA: Diagnosis not present

## 2023-08-03 DIAGNOSIS — Z79899 Other long term (current) drug therapy: Secondary | ICD-10-CM | POA: Diagnosis not present

## 2023-08-03 DIAGNOSIS — H8109 Meniere's disease, unspecified ear: Secondary | ICD-10-CM | POA: Diagnosis not present

## 2023-08-03 DIAGNOSIS — G4733 Obstructive sleep apnea (adult) (pediatric): Secondary | ICD-10-CM | POA: Diagnosis not present

## 2023-08-03 DIAGNOSIS — F17210 Nicotine dependence, cigarettes, uncomplicated: Secondary | ICD-10-CM | POA: Diagnosis not present

## 2023-08-03 DIAGNOSIS — I69351 Hemiplegia and hemiparesis following cerebral infarction affecting right dominant side: Secondary | ICD-10-CM | POA: Diagnosis not present

## 2023-08-03 DIAGNOSIS — R338 Other retention of urine: Secondary | ICD-10-CM | POA: Diagnosis not present

## 2023-08-03 DIAGNOSIS — I719 Aortic aneurysm of unspecified site, without rupture: Secondary | ICD-10-CM | POA: Diagnosis not present

## 2023-08-03 DIAGNOSIS — G912 (Idiopathic) normal pressure hydrocephalus: Secondary | ICD-10-CM | POA: Diagnosis not present

## 2023-08-03 DIAGNOSIS — J439 Emphysema, unspecified: Secondary | ICD-10-CM | POA: Diagnosis not present

## 2023-08-05 DIAGNOSIS — G4733 Obstructive sleep apnea (adult) (pediatric): Secondary | ICD-10-CM | POA: Diagnosis not present

## 2023-08-05 DIAGNOSIS — F17210 Nicotine dependence, cigarettes, uncomplicated: Secondary | ICD-10-CM | POA: Diagnosis not present

## 2023-08-05 DIAGNOSIS — K219 Gastro-esophageal reflux disease without esophagitis: Secondary | ICD-10-CM | POA: Diagnosis not present

## 2023-08-05 DIAGNOSIS — Z79899 Other long term (current) drug therapy: Secondary | ICD-10-CM | POA: Diagnosis not present

## 2023-08-05 DIAGNOSIS — Z604 Social exclusion and rejection: Secondary | ICD-10-CM | POA: Diagnosis not present

## 2023-08-05 DIAGNOSIS — E785 Hyperlipidemia, unspecified: Secondary | ICD-10-CM | POA: Diagnosis not present

## 2023-08-05 DIAGNOSIS — J439 Emphysema, unspecified: Secondary | ICD-10-CM | POA: Diagnosis not present

## 2023-08-05 DIAGNOSIS — Z556 Problems related to health literacy: Secondary | ICD-10-CM | POA: Diagnosis not present

## 2023-08-05 DIAGNOSIS — E1140 Type 2 diabetes mellitus with diabetic neuropathy, unspecified: Secondary | ICD-10-CM | POA: Diagnosis not present

## 2023-08-05 DIAGNOSIS — I69351 Hemiplegia and hemiparesis following cerebral infarction affecting right dominant side: Secondary | ICD-10-CM | POA: Diagnosis not present

## 2023-08-05 DIAGNOSIS — R338 Other retention of urine: Secondary | ICD-10-CM | POA: Diagnosis not present

## 2023-08-05 DIAGNOSIS — I719 Aortic aneurysm of unspecified site, without rupture: Secondary | ICD-10-CM | POA: Diagnosis not present

## 2023-08-05 DIAGNOSIS — H8109 Meniere's disease, unspecified ear: Secondary | ICD-10-CM | POA: Diagnosis not present

## 2023-08-05 DIAGNOSIS — I10 Essential (primary) hypertension: Secondary | ICD-10-CM | POA: Diagnosis not present

## 2023-08-05 DIAGNOSIS — Z7951 Long term (current) use of inhaled steroids: Secondary | ICD-10-CM | POA: Diagnosis not present

## 2023-08-05 DIAGNOSIS — Z9981 Dependence on supplemental oxygen: Secondary | ICD-10-CM | POA: Diagnosis not present

## 2023-08-05 DIAGNOSIS — G912 (Idiopathic) normal pressure hydrocephalus: Secondary | ICD-10-CM | POA: Diagnosis not present

## 2023-08-05 DIAGNOSIS — J9621 Acute and chronic respiratory failure with hypoxia: Secondary | ICD-10-CM | POA: Diagnosis not present

## 2023-08-05 DIAGNOSIS — J440 Chronic obstructive pulmonary disease with acute lower respiratory infection: Secondary | ICD-10-CM | POA: Diagnosis not present

## 2023-08-10 DIAGNOSIS — J439 Emphysema, unspecified: Secondary | ICD-10-CM | POA: Diagnosis not present

## 2023-08-10 DIAGNOSIS — F17210 Nicotine dependence, cigarettes, uncomplicated: Secondary | ICD-10-CM | POA: Diagnosis not present

## 2023-08-10 DIAGNOSIS — Z79899 Other long term (current) drug therapy: Secondary | ICD-10-CM | POA: Diagnosis not present

## 2023-08-10 DIAGNOSIS — Z604 Social exclusion and rejection: Secondary | ICD-10-CM | POA: Diagnosis not present

## 2023-08-10 DIAGNOSIS — G912 (Idiopathic) normal pressure hydrocephalus: Secondary | ICD-10-CM | POA: Diagnosis not present

## 2023-08-10 DIAGNOSIS — R338 Other retention of urine: Secondary | ICD-10-CM | POA: Diagnosis not present

## 2023-08-10 DIAGNOSIS — J9621 Acute and chronic respiratory failure with hypoxia: Secondary | ICD-10-CM | POA: Diagnosis not present

## 2023-08-10 DIAGNOSIS — Z556 Problems related to health literacy: Secondary | ICD-10-CM | POA: Diagnosis not present

## 2023-08-10 DIAGNOSIS — I69351 Hemiplegia and hemiparesis following cerebral infarction affecting right dominant side: Secondary | ICD-10-CM | POA: Diagnosis not present

## 2023-08-10 DIAGNOSIS — E785 Hyperlipidemia, unspecified: Secondary | ICD-10-CM | POA: Diagnosis not present

## 2023-08-10 DIAGNOSIS — G4733 Obstructive sleep apnea (adult) (pediatric): Secondary | ICD-10-CM | POA: Diagnosis not present

## 2023-08-10 DIAGNOSIS — E1140 Type 2 diabetes mellitus with diabetic neuropathy, unspecified: Secondary | ICD-10-CM | POA: Diagnosis not present

## 2023-08-10 DIAGNOSIS — Z7951 Long term (current) use of inhaled steroids: Secondary | ICD-10-CM | POA: Diagnosis not present

## 2023-08-10 DIAGNOSIS — I10 Essential (primary) hypertension: Secondary | ICD-10-CM | POA: Diagnosis not present

## 2023-08-10 DIAGNOSIS — H8109 Meniere's disease, unspecified ear: Secondary | ICD-10-CM | POA: Diagnosis not present

## 2023-08-10 DIAGNOSIS — I719 Aortic aneurysm of unspecified site, without rupture: Secondary | ICD-10-CM | POA: Diagnosis not present

## 2023-08-10 DIAGNOSIS — K219 Gastro-esophageal reflux disease without esophagitis: Secondary | ICD-10-CM | POA: Diagnosis not present

## 2023-08-10 DIAGNOSIS — J440 Chronic obstructive pulmonary disease with acute lower respiratory infection: Secondary | ICD-10-CM | POA: Diagnosis not present

## 2023-08-10 DIAGNOSIS — Z9981 Dependence on supplemental oxygen: Secondary | ICD-10-CM | POA: Diagnosis not present

## 2023-08-13 DIAGNOSIS — J9621 Acute and chronic respiratory failure with hypoxia: Secondary | ICD-10-CM | POA: Diagnosis not present

## 2023-08-13 DIAGNOSIS — G4733 Obstructive sleep apnea (adult) (pediatric): Secondary | ICD-10-CM | POA: Diagnosis not present

## 2023-08-13 DIAGNOSIS — Z604 Social exclusion and rejection: Secondary | ICD-10-CM | POA: Diagnosis not present

## 2023-08-13 DIAGNOSIS — F17210 Nicotine dependence, cigarettes, uncomplicated: Secondary | ICD-10-CM | POA: Diagnosis not present

## 2023-08-13 DIAGNOSIS — K219 Gastro-esophageal reflux disease without esophagitis: Secondary | ICD-10-CM | POA: Diagnosis not present

## 2023-08-13 DIAGNOSIS — I719 Aortic aneurysm of unspecified site, without rupture: Secondary | ICD-10-CM | POA: Diagnosis not present

## 2023-08-13 DIAGNOSIS — E785 Hyperlipidemia, unspecified: Secondary | ICD-10-CM | POA: Diagnosis not present

## 2023-08-13 DIAGNOSIS — H8109 Meniere's disease, unspecified ear: Secondary | ICD-10-CM | POA: Diagnosis not present

## 2023-08-13 DIAGNOSIS — Z556 Problems related to health literacy: Secondary | ICD-10-CM | POA: Diagnosis not present

## 2023-08-13 DIAGNOSIS — I10 Essential (primary) hypertension: Secondary | ICD-10-CM | POA: Diagnosis not present

## 2023-08-13 DIAGNOSIS — Z79899 Other long term (current) drug therapy: Secondary | ICD-10-CM | POA: Diagnosis not present

## 2023-08-13 DIAGNOSIS — G912 (Idiopathic) normal pressure hydrocephalus: Secondary | ICD-10-CM | POA: Diagnosis not present

## 2023-08-13 DIAGNOSIS — R338 Other retention of urine: Secondary | ICD-10-CM | POA: Diagnosis not present

## 2023-08-13 DIAGNOSIS — Z9981 Dependence on supplemental oxygen: Secondary | ICD-10-CM | POA: Diagnosis not present

## 2023-08-13 DIAGNOSIS — Z7951 Long term (current) use of inhaled steroids: Secondary | ICD-10-CM | POA: Diagnosis not present

## 2023-08-13 DIAGNOSIS — E1140 Type 2 diabetes mellitus with diabetic neuropathy, unspecified: Secondary | ICD-10-CM | POA: Diagnosis not present

## 2023-08-13 DIAGNOSIS — J440 Chronic obstructive pulmonary disease with acute lower respiratory infection: Secondary | ICD-10-CM | POA: Diagnosis not present

## 2023-08-13 DIAGNOSIS — I69351 Hemiplegia and hemiparesis following cerebral infarction affecting right dominant side: Secondary | ICD-10-CM | POA: Diagnosis not present

## 2023-08-13 DIAGNOSIS — J439 Emphysema, unspecified: Secondary | ICD-10-CM | POA: Diagnosis not present

## 2023-08-18 DIAGNOSIS — F17210 Nicotine dependence, cigarettes, uncomplicated: Secondary | ICD-10-CM | POA: Diagnosis not present

## 2023-08-18 DIAGNOSIS — E1140 Type 2 diabetes mellitus with diabetic neuropathy, unspecified: Secondary | ICD-10-CM | POA: Diagnosis not present

## 2023-08-18 DIAGNOSIS — J439 Emphysema, unspecified: Secondary | ICD-10-CM | POA: Diagnosis not present

## 2023-08-18 DIAGNOSIS — I10 Essential (primary) hypertension: Secondary | ICD-10-CM | POA: Diagnosis not present

## 2023-08-18 DIAGNOSIS — H8109 Meniere's disease, unspecified ear: Secondary | ICD-10-CM | POA: Diagnosis not present

## 2023-08-18 DIAGNOSIS — I719 Aortic aneurysm of unspecified site, without rupture: Secondary | ICD-10-CM | POA: Diagnosis not present

## 2023-08-18 DIAGNOSIS — Z79899 Other long term (current) drug therapy: Secondary | ICD-10-CM | POA: Diagnosis not present

## 2023-08-18 DIAGNOSIS — E785 Hyperlipidemia, unspecified: Secondary | ICD-10-CM | POA: Diagnosis not present

## 2023-08-18 DIAGNOSIS — J9621 Acute and chronic respiratory failure with hypoxia: Secondary | ICD-10-CM | POA: Diagnosis not present

## 2023-08-18 DIAGNOSIS — Z604 Social exclusion and rejection: Secondary | ICD-10-CM | POA: Diagnosis not present

## 2023-08-18 DIAGNOSIS — Z556 Problems related to health literacy: Secondary | ICD-10-CM | POA: Diagnosis not present

## 2023-08-18 DIAGNOSIS — R338 Other retention of urine: Secondary | ICD-10-CM | POA: Diagnosis not present

## 2023-08-18 DIAGNOSIS — I69351 Hemiplegia and hemiparesis following cerebral infarction affecting right dominant side: Secondary | ICD-10-CM | POA: Diagnosis not present

## 2023-08-18 DIAGNOSIS — J440 Chronic obstructive pulmonary disease with acute lower respiratory infection: Secondary | ICD-10-CM | POA: Diagnosis not present

## 2023-08-18 DIAGNOSIS — G912 (Idiopathic) normal pressure hydrocephalus: Secondary | ICD-10-CM | POA: Diagnosis not present

## 2023-08-18 DIAGNOSIS — Z7951 Long term (current) use of inhaled steroids: Secondary | ICD-10-CM | POA: Diagnosis not present

## 2023-08-18 DIAGNOSIS — K219 Gastro-esophageal reflux disease without esophagitis: Secondary | ICD-10-CM | POA: Diagnosis not present

## 2023-08-18 DIAGNOSIS — Z9981 Dependence on supplemental oxygen: Secondary | ICD-10-CM | POA: Diagnosis not present

## 2023-08-18 DIAGNOSIS — G4733 Obstructive sleep apnea (adult) (pediatric): Secondary | ICD-10-CM | POA: Diagnosis not present

## 2023-08-24 DIAGNOSIS — B372 Candidiasis of skin and nail: Secondary | ICD-10-CM | POA: Diagnosis not present

## 2023-08-24 DIAGNOSIS — N390 Urinary tract infection, site not specified: Secondary | ICD-10-CM | POA: Diagnosis not present

## 2023-08-24 DIAGNOSIS — R310 Gross hematuria: Secondary | ICD-10-CM | POA: Diagnosis not present

## 2023-10-28 DIAGNOSIS — B353 Tinea pedis: Secondary | ICD-10-CM | POA: Diagnosis not present

## 2023-10-28 DIAGNOSIS — B351 Tinea unguium: Secondary | ICD-10-CM | POA: Diagnosis not present

## 2023-10-28 DIAGNOSIS — Z7984 Long term (current) use of oral hypoglycemic drugs: Secondary | ICD-10-CM | POA: Diagnosis not present

## 2023-10-28 DIAGNOSIS — E1140 Type 2 diabetes mellitus with diabetic neuropathy, unspecified: Secondary | ICD-10-CM | POA: Diagnosis not present

## 2023-12-09 DIAGNOSIS — E1169 Type 2 diabetes mellitus with other specified complication: Secondary | ICD-10-CM | POA: Diagnosis not present

## 2023-12-09 DIAGNOSIS — G8929 Other chronic pain: Secondary | ICD-10-CM | POA: Diagnosis not present

## 2023-12-09 DIAGNOSIS — M542 Cervicalgia: Secondary | ICD-10-CM | POA: Diagnosis not present

## 2023-12-09 DIAGNOSIS — J9600 Acute respiratory failure, unspecified whether with hypoxia or hypercapnia: Secondary | ICD-10-CM | POA: Diagnosis not present

## 2023-12-09 DIAGNOSIS — N3001 Acute cystitis with hematuria: Secondary | ICD-10-CM | POA: Diagnosis not present

## 2023-12-09 DIAGNOSIS — R300 Dysuria: Secondary | ICD-10-CM | POA: Diagnosis not present

## 2023-12-09 DIAGNOSIS — J441 Chronic obstructive pulmonary disease with (acute) exacerbation: Secondary | ICD-10-CM | POA: Diagnosis not present

## 2023-12-09 DIAGNOSIS — Z0000 Encounter for general adult medical examination without abnormal findings: Secondary | ICD-10-CM | POA: Diagnosis not present

## 2023-12-09 DIAGNOSIS — E1140 Type 2 diabetes mellitus with diabetic neuropathy, unspecified: Secondary | ICD-10-CM | POA: Diagnosis not present

## 2023-12-09 DIAGNOSIS — E785 Hyperlipidemia, unspecified: Secondary | ICD-10-CM | POA: Diagnosis not present

## 2023-12-09 DIAGNOSIS — G8191 Hemiplegia, unspecified affecting right dominant side: Secondary | ICD-10-CM | POA: Diagnosis not present

## 2023-12-09 DIAGNOSIS — G912 (Idiopathic) normal pressure hydrocephalus: Secondary | ICD-10-CM | POA: Diagnosis not present

## 2023-12-13 DIAGNOSIS — R2689 Other abnormalities of gait and mobility: Secondary | ICD-10-CM | POA: Diagnosis not present

## 2023-12-13 DIAGNOSIS — E876 Hypokalemia: Secondary | ICD-10-CM | POA: Diagnosis not present

## 2023-12-13 DIAGNOSIS — M542 Cervicalgia: Secondary | ICD-10-CM | POA: Diagnosis not present

## 2023-12-13 DIAGNOSIS — E559 Vitamin D deficiency, unspecified: Secondary | ICD-10-CM | POA: Diagnosis not present

## 2023-12-16 DIAGNOSIS — E559 Vitamin D deficiency, unspecified: Secondary | ICD-10-CM | POA: Diagnosis not present

## 2023-12-16 DIAGNOSIS — E876 Hypokalemia: Secondary | ICD-10-CM | POA: Diagnosis not present

## 2023-12-31 DIAGNOSIS — R300 Dysuria: Secondary | ICD-10-CM | POA: Diagnosis not present

## 2024-01-25 DIAGNOSIS — N390 Urinary tract infection, site not specified: Secondary | ICD-10-CM | POA: Diagnosis not present

## 2024-02-01 DIAGNOSIS — R390 Extravasation of urine: Secondary | ICD-10-CM | POA: Diagnosis not present

## 2024-02-17 DIAGNOSIS — N132 Hydronephrosis with renal and ureteral calculous obstruction: Secondary | ICD-10-CM | POA: Diagnosis not present

## 2024-02-17 DIAGNOSIS — R6889 Other general symptoms and signs: Secondary | ICD-10-CM | POA: Diagnosis not present

## 2024-02-17 DIAGNOSIS — R310 Gross hematuria: Secondary | ICD-10-CM | POA: Diagnosis not present

## 2024-02-17 DIAGNOSIS — R339 Retention of urine, unspecified: Secondary | ICD-10-CM | POA: Diagnosis not present

## 2024-02-17 DIAGNOSIS — R220 Localized swelling, mass and lump, head: Secondary | ICD-10-CM | POA: Diagnosis not present

## 2024-02-17 DIAGNOSIS — H9190 Unspecified hearing loss, unspecified ear: Secondary | ICD-10-CM | POA: Diagnosis not present

## 2024-02-17 DIAGNOSIS — E785 Hyperlipidemia, unspecified: Secondary | ICD-10-CM | POA: Diagnosis not present

## 2024-02-17 DIAGNOSIS — R404 Transient alteration of awareness: Secondary | ICD-10-CM | POA: Diagnosis not present

## 2024-02-17 DIAGNOSIS — J811 Chronic pulmonary edema: Secondary | ICD-10-CM | POA: Diagnosis not present

## 2024-02-17 DIAGNOSIS — I712 Thoracic aortic aneurysm, without rupture, unspecified: Secondary | ICD-10-CM | POA: Diagnosis not present

## 2024-02-17 DIAGNOSIS — Z960 Presence of urogenital implants: Secondary | ICD-10-CM | POA: Diagnosis not present

## 2024-02-17 DIAGNOSIS — G912 (Idiopathic) normal pressure hydrocephalus: Secondary | ICD-10-CM | POA: Diagnosis not present

## 2024-02-17 DIAGNOSIS — D649 Anemia, unspecified: Secondary | ICD-10-CM | POA: Diagnosis not present

## 2024-02-17 DIAGNOSIS — G9389 Other specified disorders of brain: Secondary | ICD-10-CM | POA: Diagnosis not present

## 2024-02-17 DIAGNOSIS — E876 Hypokalemia: Secondary | ICD-10-CM | POA: Diagnosis not present

## 2024-02-17 DIAGNOSIS — D320 Benign neoplasm of cerebral meninges: Secondary | ICD-10-CM | POA: Diagnosis not present

## 2024-02-17 DIAGNOSIS — Z66 Do not resuscitate: Secondary | ICD-10-CM | POA: Diagnosis not present

## 2024-02-17 DIAGNOSIS — J449 Chronic obstructive pulmonary disease, unspecified: Secondary | ICD-10-CM | POA: Diagnosis not present

## 2024-02-17 DIAGNOSIS — M1990 Unspecified osteoarthritis, unspecified site: Secondary | ICD-10-CM | POA: Diagnosis not present

## 2024-02-17 DIAGNOSIS — E878 Other disorders of electrolyte and fluid balance, not elsewhere classified: Secondary | ICD-10-CM | POA: Diagnosis not present

## 2024-02-17 DIAGNOSIS — R9389 Abnormal findings on diagnostic imaging of other specified body structures: Secondary | ICD-10-CM | POA: Diagnosis not present

## 2024-02-17 DIAGNOSIS — N3289 Other specified disorders of bladder: Secondary | ICD-10-CM | POA: Diagnosis not present

## 2024-02-17 DIAGNOSIS — K219 Gastro-esophageal reflux disease without esophagitis: Secondary | ICD-10-CM | POA: Diagnosis not present

## 2024-02-17 DIAGNOSIS — M6281 Muscle weakness (generalized): Secondary | ICD-10-CM | POA: Diagnosis not present

## 2024-02-17 DIAGNOSIS — C679 Malignant neoplasm of bladder, unspecified: Secondary | ICD-10-CM | POA: Diagnosis not present

## 2024-02-17 DIAGNOSIS — K5900 Constipation, unspecified: Secondary | ICD-10-CM | POA: Diagnosis not present

## 2024-02-17 DIAGNOSIS — J9811 Atelectasis: Secondary | ICD-10-CM | POA: Diagnosis not present

## 2024-02-17 DIAGNOSIS — I480 Paroxysmal atrial fibrillation: Secondary | ICD-10-CM | POA: Diagnosis not present

## 2024-02-17 DIAGNOSIS — R9089 Other abnormal findings on diagnostic imaging of central nervous system: Secondary | ICD-10-CM | POA: Diagnosis not present

## 2024-02-17 DIAGNOSIS — I4891 Unspecified atrial fibrillation: Secondary | ICD-10-CM | POA: Diagnosis not present

## 2024-02-17 DIAGNOSIS — R262 Difficulty in walking, not elsewhere classified: Secondary | ICD-10-CM | POA: Diagnosis not present

## 2024-02-17 DIAGNOSIS — N179 Acute kidney failure, unspecified: Secondary | ICD-10-CM | POA: Diagnosis not present

## 2024-02-17 DIAGNOSIS — I69351 Hemiplegia and hemiparesis following cerebral infarction affecting right dominant side: Secondary | ICD-10-CM | POA: Diagnosis not present

## 2024-02-17 DIAGNOSIS — R319 Hematuria, unspecified: Secondary | ICD-10-CM | POA: Diagnosis not present

## 2024-02-17 DIAGNOSIS — I7143 Infrarenal abdominal aortic aneurysm, without rupture: Secondary | ICD-10-CM | POA: Diagnosis not present

## 2024-02-17 DIAGNOSIS — R3982 Chronic bladder pain: Secondary | ICD-10-CM | POA: Diagnosis not present

## 2024-02-17 DIAGNOSIS — I3139 Other pericardial effusion (noninflammatory): Secondary | ICD-10-CM | POA: Diagnosis not present

## 2024-02-17 DIAGNOSIS — N139 Obstructive and reflux uropathy, unspecified: Secondary | ICD-10-CM | POA: Diagnosis not present

## 2024-02-17 DIAGNOSIS — T83098A Other mechanical complication of other indwelling urethral catheter, initial encounter: Secondary | ICD-10-CM | POA: Diagnosis not present

## 2024-02-17 DIAGNOSIS — E43 Unspecified severe protein-calorie malnutrition: Secondary | ICD-10-CM | POA: Diagnosis not present

## 2024-02-17 DIAGNOSIS — N1330 Unspecified hydronephrosis: Secondary | ICD-10-CM | POA: Diagnosis not present

## 2024-02-17 DIAGNOSIS — I499 Cardiac arrhythmia, unspecified: Secondary | ICD-10-CM | POA: Diagnosis not present

## 2024-02-17 DIAGNOSIS — I10 Essential (primary) hypertension: Secondary | ICD-10-CM | POA: Diagnosis not present

## 2024-02-17 DIAGNOSIS — I491 Atrial premature depolarization: Secondary | ICD-10-CM | POA: Diagnosis not present

## 2024-02-17 DIAGNOSIS — E119 Type 2 diabetes mellitus without complications: Secondary | ICD-10-CM | POA: Diagnosis not present

## 2024-02-17 DIAGNOSIS — E440 Moderate protein-calorie malnutrition: Secondary | ICD-10-CM | POA: Diagnosis not present

## 2024-02-17 DIAGNOSIS — R410 Disorientation, unspecified: Secondary | ICD-10-CM | POA: Diagnosis not present

## 2024-02-17 DIAGNOSIS — I2510 Atherosclerotic heart disease of native coronary artery without angina pectoris: Secondary | ICD-10-CM | POA: Diagnosis not present

## 2024-02-17 DIAGNOSIS — Z9981 Dependence on supplemental oxygen: Secondary | ICD-10-CM | POA: Diagnosis not present

## 2024-02-17 DIAGNOSIS — R5381 Other malaise: Secondary | ICD-10-CM | POA: Diagnosis not present

## 2024-02-24 DIAGNOSIS — E119 Type 2 diabetes mellitus without complications: Secondary | ICD-10-CM | POA: Diagnosis not present

## 2024-02-24 DIAGNOSIS — H9190 Unspecified hearing loss, unspecified ear: Secondary | ICD-10-CM | POA: Diagnosis not present

## 2024-02-24 DIAGNOSIS — R262 Difficulty in walking, not elsewhere classified: Secondary | ICD-10-CM | POA: Diagnosis not present

## 2024-02-24 DIAGNOSIS — D320 Benign neoplasm of cerebral meninges: Secondary | ICD-10-CM | POA: Diagnosis not present

## 2024-02-24 DIAGNOSIS — M6281 Muscle weakness (generalized): Secondary | ICD-10-CM | POA: Diagnosis not present

## 2024-02-24 DIAGNOSIS — E876 Hypokalemia: Secondary | ICD-10-CM | POA: Diagnosis not present

## 2024-02-24 DIAGNOSIS — I480 Paroxysmal atrial fibrillation: Secondary | ICD-10-CM | POA: Diagnosis not present

## 2024-02-24 DIAGNOSIS — N139 Obstructive and reflux uropathy, unspecified: Secondary | ICD-10-CM | POA: Diagnosis not present

## 2024-02-24 DIAGNOSIS — K5900 Constipation, unspecified: Secondary | ICD-10-CM | POA: Diagnosis not present

## 2024-02-24 DIAGNOSIS — I69351 Hemiplegia and hemiparesis following cerebral infarction affecting right dominant side: Secondary | ICD-10-CM | POA: Diagnosis not present

## 2024-02-24 DIAGNOSIS — E785 Hyperlipidemia, unspecified: Secondary | ICD-10-CM | POA: Diagnosis not present

## 2024-02-24 DIAGNOSIS — R339 Retention of urine, unspecified: Secondary | ICD-10-CM | POA: Diagnosis not present

## 2024-02-24 DIAGNOSIS — K219 Gastro-esophageal reflux disease without esophagitis: Secondary | ICD-10-CM | POA: Diagnosis not present

## 2024-02-24 DIAGNOSIS — R3982 Chronic bladder pain: Secondary | ICD-10-CM | POA: Diagnosis not present

## 2024-02-24 DIAGNOSIS — E440 Moderate protein-calorie malnutrition: Secondary | ICD-10-CM | POA: Diagnosis not present

## 2024-02-24 DIAGNOSIS — R5381 Other malaise: Secondary | ICD-10-CM | POA: Diagnosis not present

## 2024-02-24 DIAGNOSIS — C679 Malignant neoplasm of bladder, unspecified: Secondary | ICD-10-CM | POA: Diagnosis not present

## 2024-02-24 DIAGNOSIS — J449 Chronic obstructive pulmonary disease, unspecified: Secondary | ICD-10-CM | POA: Diagnosis not present

## 2024-02-24 DIAGNOSIS — R310 Gross hematuria: Secondary | ICD-10-CM | POA: Diagnosis not present

## 2024-02-24 DIAGNOSIS — G912 (Idiopathic) normal pressure hydrocephalus: Secondary | ICD-10-CM | POA: Diagnosis not present

## 2024-03-07 DIAGNOSIS — N3289 Other specified disorders of bladder: Secondary | ICD-10-CM | POA: Diagnosis not present

## 2024-03-07 DIAGNOSIS — R310 Gross hematuria: Secondary | ICD-10-CM | POA: Diagnosis not present

## 2024-05-02 ENCOUNTER — Telehealth: Payer: Self-pay

## 2024-05-02 NOTE — Telephone Encounter (Signed)
 Copied from CRM #[id number]. Topic: General - Other >> May 01, 2024  4:56 PM Devaughn RAMAN wrote: Reason for CRM: Dr.Lindsey Gouker called and requested to speak with Dr.Desai regarding pt upcoming surgery and she stated the pt is not adhering to pulmonary advice and she is one of the anesthesiologist and wanted to f/u with Dr.Desai to discuss the pt.  Dr.Gouker personal cell Phone-[phone]  Routing to Dr Meade as she will be in office on 05-03-24

## 2024-05-03 NOTE — Telephone Encounter (Signed)
 Ok - I have a return call out for her with a message.

## 2024-05-04 NOTE — Telephone Encounter (Signed)
 Spoke with Dr. Gouker.Patient has upcoming surgery for urinary outlet obstruction due to malignancy. Has been lost to pulmonary follow up, not taking inhalers. Wife recently died from COPD. He wants to get back into our pulmonary care.

## 2024-05-09 NOTE — Telephone Encounter (Signed)
 Attempted to call patient. Left voicemail for patient to call our office a call back to get scheduled.

## 2024-05-22 ENCOUNTER — Telehealth: Payer: Self-pay | Admitting: *Deleted

## 2024-05-22 NOTE — Transitions of Care (Post Inpatient/ED Visit) (Signed)
" ° °  05/22/2024  Name: [name] MRN: [id number] DOB: 1945-10-13  Today's TOC FU Call Status: Today's TOC FU Call Status:: Unsuccessful Call (1st Attempt) Unsuccessful Call (1st Attempt) Date: 05/22/24  Attempted to reach the patient regarding the most recent Inpatient/ED visit.  Follow Up Plan: Additional outreach attempts will be made to reach the patient to complete the Transitions of Care (Post Inpatient/ED visit) call.   Cathlean Headland BSN RN [facility] Health Care Management Coordinator [email or web address] Direct Dial: [phone]  Fax: [phone] Website: [email or web address]  "

## 2024-05-23 ENCOUNTER — Telehealth: Payer: Self-pay | Admitting: *Deleted

## 2024-05-23 NOTE — Transitions of Care (Post Inpatient/ED Visit) (Signed)
" ° °  05/23/2024  Name: Wayne [name] MRN: [id number] DOB: December 23, 1945  Today's TOC FU Call Status: Today's TOC FU Call Status:: Unsuccessful Call (2nd Attempt) Unsuccessful Call (2nd Attempt) Date: 05/23/24  Attempted to reach the patient regarding the most recent Inpatient/ED visit.  Follow Up Plan: Additional outreach attempts will be made to reach the patient to complete the Transitions of Care (Post Inpatient/ED visit) call.   Cathlean Headland BSN RN [facility] Health Care Management Coordinator [email or web address] Direct Dial: [phone]  Fax: [phone] Website: [email or web address]  "

## 2024-05-24 ENCOUNTER — Telehealth: Payer: Self-pay

## 2024-05-24 NOTE — Transitions of Care (Post Inpatient/ED Visit) (Signed)
" ° °  05/24/2024  Name: Wayne [name] Sr MRN: [id number] DOB: 07-13-1945  Today's TOC FU Call Status: Today's TOC FU Call Status:: Unsuccessful Call (3rd Attempt) Unsuccessful Call (3rd Attempt) Date: 05/24/24  Attempted to reach the patient regarding the most recent Inpatient/ED visit.  Follow Up Plan: No further outreach attempts will be made at this time. We have been unable to contact the patient.  Shona Prow RN, CCM [place]  VBCI-Population Health RN Care Manager [phone]  "

## 2024-05-26 NOTE — Telephone Encounter (Signed)
 Left voicemail for patient to call back to schedule transfer of care visit with a new MD- will mail letter
